# Patient Record
Sex: Female | Born: 1985 | Race: Black or African American | Hispanic: No | Marital: Single | Smoking: Current every day smoker
Health system: Southern US, Community
[De-identification: ages and names within clinical notes are randomized; demographics above are authoritative.]

## PROBLEM LIST (undated history)

## (undated) ENCOUNTER — Inpatient Hospital Stay (HOSPITAL_COMMUNITY): Payer: Self-pay

## (undated) VITALS — BP 104/64 | HR 54 | Temp 98.2°F | Resp 16

## (undated) DIAGNOSIS — L02419 Cutaneous abscess of limb, unspecified: Secondary | ICD-10-CM

## (undated) DIAGNOSIS — F329 Major depressive disorder, single episode, unspecified: Secondary | ICD-10-CM

## (undated) DIAGNOSIS — F32A Depression, unspecified: Secondary | ICD-10-CM

## (undated) DIAGNOSIS — G43909 Migraine, unspecified, not intractable, without status migrainosus: Secondary | ICD-10-CM

## (undated) DIAGNOSIS — F419 Anxiety disorder, unspecified: Secondary | ICD-10-CM

## (undated) DIAGNOSIS — K649 Unspecified hemorrhoids: Secondary | ICD-10-CM

## (undated) DIAGNOSIS — R011 Cardiac murmur, unspecified: Secondary | ICD-10-CM

## (undated) DIAGNOSIS — R0600 Dyspnea, unspecified: Secondary | ICD-10-CM

## (undated) DIAGNOSIS — N390 Urinary tract infection, site not specified: Secondary | ICD-10-CM

## (undated) DIAGNOSIS — K610 Anal abscess: Secondary | ICD-10-CM

## (undated) DIAGNOSIS — T1491XA Suicide attempt, initial encounter: Secondary | ICD-10-CM

## (undated) DIAGNOSIS — F322 Major depressive disorder, single episode, severe without psychotic features: Secondary | ICD-10-CM

## (undated) DIAGNOSIS — R51 Headache: Secondary | ICD-10-CM

## (undated) DIAGNOSIS — F319 Bipolar disorder, unspecified: Secondary | ICD-10-CM

## (undated) DIAGNOSIS — J42 Unspecified chronic bronchitis: Secondary | ICD-10-CM

## (undated) DIAGNOSIS — F1910 Other psychoactive substance abuse, uncomplicated: Secondary | ICD-10-CM

## (undated) DIAGNOSIS — E876 Hypokalemia: Secondary | ICD-10-CM

## (undated) DIAGNOSIS — D649 Anemia, unspecified: Secondary | ICD-10-CM

## (undated) HISTORY — DX: Other psychoactive substance abuse, uncomplicated: F19.10

## (undated) HISTORY — PX: DENTAL SURGERY: SHX609

## (undated) HISTORY — DX: Migraine, unspecified, not intractable, without status migrainosus: G43.909

## (undated) HISTORY — DX: Cardiac murmur, unspecified: R01.1

---
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
## 2003-01-01 ENCOUNTER — Inpatient Hospital Stay (HOSPITAL_COMMUNITY): Admission: AD | Admit: 2003-01-01 | Discharge: 2003-01-01 | Payer: Self-pay | Admitting: Obstetrics

## 2003-05-27 ENCOUNTER — Inpatient Hospital Stay (HOSPITAL_COMMUNITY): Admission: AD | Admit: 2003-05-27 | Discharge: 2003-05-29 | Payer: Self-pay | Admitting: Obstetrics

## 2003-08-24 ENCOUNTER — Emergency Department (HOSPITAL_COMMUNITY): Admission: EM | Admit: 2003-08-24 | Discharge: 2003-08-24 | Payer: Self-pay | Admitting: Emergency Medicine

## 2003-09-04 ENCOUNTER — Emergency Department (HOSPITAL_COMMUNITY): Admission: EM | Admit: 2003-09-04 | Discharge: 2003-09-04 | Payer: Self-pay | Admitting: Emergency Medicine

## 2004-09-17 ENCOUNTER — Emergency Department (HOSPITAL_COMMUNITY): Admission: EM | Admit: 2004-09-17 | Discharge: 2004-09-17 | Payer: Self-pay | Admitting: Emergency Medicine

## 2004-11-15 ENCOUNTER — Emergency Department (HOSPITAL_COMMUNITY): Admission: EM | Admit: 2004-11-15 | Discharge: 2004-11-15 | Payer: Self-pay | Admitting: Emergency Medicine

## 2004-12-02 ENCOUNTER — Emergency Department (HOSPITAL_COMMUNITY): Admission: EM | Admit: 2004-12-02 | Discharge: 2004-12-02 | Payer: Self-pay | Admitting: Emergency Medicine

## 2005-01-13 ENCOUNTER — Emergency Department (HOSPITAL_COMMUNITY): Admission: EM | Admit: 2005-01-13 | Discharge: 2005-01-14 | Payer: Self-pay | Admitting: Emergency Medicine

## 2005-01-15 ENCOUNTER — Inpatient Hospital Stay (HOSPITAL_COMMUNITY): Admission: AD | Admit: 2005-01-15 | Discharge: 2005-01-15 | Payer: Self-pay | Admitting: Obstetrics & Gynecology

## 2005-01-17 ENCOUNTER — Inpatient Hospital Stay (HOSPITAL_COMMUNITY): Admission: AD | Admit: 2005-01-17 | Discharge: 2005-01-17 | Payer: Self-pay | Admitting: Obstetrics

## 2005-01-17 ENCOUNTER — Encounter (INDEPENDENT_AMBULATORY_CARE_PROVIDER_SITE_OTHER): Payer: Self-pay | Admitting: *Deleted

## 2005-12-12 ENCOUNTER — Encounter (INDEPENDENT_AMBULATORY_CARE_PROVIDER_SITE_OTHER): Payer: Self-pay | Admitting: *Deleted

## 2005-12-12 ENCOUNTER — Emergency Department (HOSPITAL_COMMUNITY): Admission: EM | Admit: 2005-12-12 | Discharge: 2005-12-12 | Payer: Self-pay | Admitting: Emergency Medicine

## 2006-04-08 ENCOUNTER — Inpatient Hospital Stay (HOSPITAL_COMMUNITY): Admission: AD | Admit: 2006-04-08 | Discharge: 2006-04-08 | Payer: Self-pay | Admitting: Obstetrics

## 2006-04-24 ENCOUNTER — Inpatient Hospital Stay (HOSPITAL_COMMUNITY): Admission: AD | Admit: 2006-04-24 | Discharge: 2006-04-24 | Payer: Self-pay | Admitting: Obstetrics

## 2006-07-19 ENCOUNTER — Ambulatory Visit: Payer: Self-pay | Admitting: Cardiology

## 2006-07-20 ENCOUNTER — Ambulatory Visit: Payer: Self-pay

## 2006-07-26 ENCOUNTER — Ambulatory Visit: Payer: Self-pay

## 2007-02-17 ENCOUNTER — Emergency Department (HOSPITAL_COMMUNITY): Admission: EM | Admit: 2007-02-17 | Discharge: 2007-02-17 | Payer: Self-pay | Admitting: Emergency Medicine

## 2007-02-19 ENCOUNTER — Emergency Department (HOSPITAL_COMMUNITY): Admission: EM | Admit: 2007-02-19 | Discharge: 2007-02-19 | Payer: Self-pay | Admitting: Emergency Medicine

## 2007-04-08 ENCOUNTER — Emergency Department (HOSPITAL_COMMUNITY): Admission: EM | Admit: 2007-04-08 | Discharge: 2007-04-08 | Payer: Self-pay | Admitting: Emergency Medicine

## 2007-04-11 ENCOUNTER — Emergency Department (HOSPITAL_COMMUNITY): Admission: EM | Admit: 2007-04-11 | Discharge: 2007-04-11 | Payer: Self-pay | Admitting: Family Medicine

## 2007-09-08 ENCOUNTER — Emergency Department (HOSPITAL_COMMUNITY): Admission: EM | Admit: 2007-09-08 | Discharge: 2007-09-09 | Payer: Self-pay | Admitting: Emergency Medicine

## 2007-10-07 ENCOUNTER — Emergency Department (HOSPITAL_COMMUNITY): Admission: EM | Admit: 2007-10-07 | Discharge: 2007-10-07 | Payer: Self-pay | Admitting: Emergency Medicine

## 2008-01-26 ENCOUNTER — Inpatient Hospital Stay (HOSPITAL_COMMUNITY): Admission: AD | Admit: 2008-01-26 | Discharge: 2008-01-29 | Payer: Self-pay | Admitting: *Deleted

## 2008-01-26 ENCOUNTER — Ambulatory Visit: Payer: Self-pay | Admitting: *Deleted

## 2008-02-01 ENCOUNTER — Encounter (INDEPENDENT_AMBULATORY_CARE_PROVIDER_SITE_OTHER): Payer: Self-pay | Admitting: *Deleted

## 2008-02-01 ENCOUNTER — Emergency Department (HOSPITAL_COMMUNITY): Admission: EM | Admit: 2008-02-01 | Discharge: 2008-02-01 | Payer: Self-pay | Admitting: Emergency Medicine

## 2008-04-01 ENCOUNTER — Emergency Department (HOSPITAL_COMMUNITY): Admission: EM | Admit: 2008-04-01 | Discharge: 2008-04-01 | Payer: Self-pay | Admitting: Emergency Medicine

## 2009-04-28 ENCOUNTER — Emergency Department (HOSPITAL_COMMUNITY): Admission: EM | Admit: 2009-04-28 | Discharge: 2009-04-28 | Payer: Self-pay | Admitting: Emergency Medicine

## 2009-12-29 ENCOUNTER — Emergency Department (HOSPITAL_COMMUNITY): Admission: EM | Admit: 2009-12-29 | Discharge: 2009-12-29 | Payer: Self-pay | Admitting: Family Medicine

## 2010-04-09 ENCOUNTER — Emergency Department (HOSPITAL_COMMUNITY): Admission: EM | Admit: 2010-04-09 | Discharge: 2010-04-09 | Payer: Self-pay | Admitting: Emergency Medicine

## 2011-03-05 LAB — POCT URINALYSIS DIP (DEVICE)
Bilirubin Urine: NEGATIVE
Hgb urine dipstick: NEGATIVE
Protein, ur: NEGATIVE mg/dL
Specific Gravity, Urine: 1.025 (ref 1.005–1.030)
pH: 6.5 (ref 5.0–8.0)

## 2011-03-07 LAB — URINALYSIS, ROUTINE W REFLEX MICROSCOPIC
Bilirubin Urine: NEGATIVE
Glucose, UA: NEGATIVE mg/dL
Hgb urine dipstick: NEGATIVE
Specific Gravity, Urine: 1.03 (ref 1.005–1.030)
pH: 6.5 (ref 5.0–8.0)

## 2011-03-07 LAB — URINE MICROSCOPIC-ADD ON

## 2011-03-28 LAB — URINALYSIS, ROUTINE W REFLEX MICROSCOPIC
Glucose, UA: NEGATIVE mg/dL
Ketones, ur: NEGATIVE mg/dL
Protein, ur: >300 mg/dL — AB
Urobilinogen, UA: 1 mg/dL (ref 0.0–1.0)

## 2011-03-28 LAB — WET PREP, GENITAL: Trich, Wet Prep: NONE SEEN

## 2011-03-28 LAB — GC/CHLAMYDIA PROBE AMP, GENITAL: GC Probe Amp, Genital: NEGATIVE

## 2011-03-28 LAB — URINE MICROSCOPIC-ADD ON

## 2011-03-28 LAB — POCT PREGNANCY, URINE: Preg Test, Ur: NEGATIVE

## 2011-05-02 NOTE — H&P (Signed)
NAMEBECKA, LAGASSE NO.:  [id number]   MEDICAL RECORD NO.:  [id number]          PATIENT TYPE:  IPS   LOCATION:  0500                          FACILITY:  [facility]   PHYSICIAN:  Jasmine Pang, M.D. DATE OF BIRTH:  03/07/1986   DATE OF ADMISSION:  01/26/2008  DATE OF DISCHARGE:                       PSYCHIATRIC ADMISSION ASSESSMENT   HISTORY OF PRESENT ILLNESS:  The patient reports having a history of  endorsing mood swings, feeling very angry, wanting to lash out.  She has  not been sleeping.  She has been eating sporadically.  Per chart, it  states the patient was having suicidal thoughts with a plan to jump off  a building or to cut her wrists.  She does report that her mother was  concerned about her and had brought her to mental health and  subsequently was admitted to our facility.   PAST PSYCHIATRIC HISTORY:  First admission to [facility].  She has no history of ever being on any medications.  She was treated as  a teenager for ADD.   SOCIAL HISTORY:  She is a 25 year old old female.  She has two children,  ages three and one.  She has a court date pending in regards to wanting  to keep her house.   FAMILY HISTORY:  Sister reported to be bipolar.   ALCOHOL AND DRUG HISTORY:  The patient reports marijuana use.   PRIMARY CARE Maryssa Giampietro:  The patient reports seeing a Dr. Nolen Mu but  has not seen this Eron Staat recently.   MEDICAL PROBLEMS:  She reports low iron, has had no follow-up with her  medications for some time.  Also reports a history of a leaky valve and  a possible heart murmur.   MEDICATIONS:  None prior to admissions.   DRUG ALLERGIES:  PENICILLIN, AMOXICILLIN and ALBUTEROL.   REVIEW OF SYSTEMS:  She denies any current fever, chills, positive for  insomnia, positive for appetite changes.  No chest pain, positive for  shortness breath.  No nausea, vomiting, no dysuria, no muscle pain.  No  falls.  No seizures.   PHYSICAL  EXAMINATION:  GENERAL APPEARANCE:  This is a young female who  is in no acute distress.  Denying any symptoms of dizziness, shortness  of breath.  VITAL SIGNS:  Temperature is 98.5, 73 heart rate, 16 respirations, blood  pressure is 101/63.  HEENT:  Head is atraumatic.  NECK:  Supple.  CHEST:  Clear, no wheezes or rales.  HEART:  Regular rate and rhythm.  No murmurs or gallops were  auscultated.  BREASTS:  Exam was deferred.  ABDOMEN:  Soft, flat, nontender abdomen.  PELVIC AND GU:  Exam was deferred.  EXTREMITIES:  Moves all extremities.  No clubbing or edema.  Strong at  5+ against resistance.  SKIN:  Warm and dry.  Tattoo is noted at the back of her neck.  NEUROLOGIC:  Findings are intact, nonfocal.  No tics, no tremors.   LABORATORY DATA:  Glucose of 102.  Urine pregnancy test is negative.  Urine drug screen positive for THC.  Hemoglobin of 7, hematocrit  23.5,  MCV is 54.1, MCHC is 29.8 with an RDW 23.3.   MENTAL STATUS EXAM:  She is an alert, somewhat uncooperative female,  sitting with her hood on with poor eye contact.  Speech is soft-spoken  and she offers little.  Her mood is helpless and hopeless, angry,  hostile.  Her thought processes are coherent.  No evidence of any  delusional thinking, cognitive function intact.  Her memory is good.  Judgment and insight fair.  Poor impulse control.   DIAGNOSES:  AXIS I:  1. Mood disorder NOS.  2. Marijuana abuse.  AXIS II:  Deferred.  AXIS III:  1. Anemia.  2. Murmur or leaky valve per patient history.  AXIS IV:  Problems with legal system, psychosocial problems.  Possible  problems with finances and housing.  AXIS V:  Current is 35-40,   PLAN:  Contract for safety.  We will stabilize her mood and thinking,  will have Seroquel available for mood stabilization, for anxiety.  Will  consider Depakote after obtaining a urine pregnancy test.  The patient  to increase her coping skills.  We will initiate iron for her anemia.   The patient was informed to follow up as soon as possible with her  primary care Latyra Jaye for further management of her anemia.  Consider  family session with her mother for concerns and background information.  Case manager will obtain her follow-up.  We will also address her  substance use.  Her tentative length of stay is three to four days.      Landry Corporal, N.P.      Jasmine Pang, M.D.  Electronically Signed    JO/MEDQ  D:  01/28/2008  T:  01/29/2008  Job:  [id number]

## 2011-05-05 ENCOUNTER — Emergency Department (HOSPITAL_COMMUNITY)
Admission: EM | Admit: 2011-05-05 | Discharge: 2011-05-05 | Disposition: A | Discharge status: Home / Self Care | Payer: Medicaid Other | Attending: Emergency Medicine | Admitting: Emergency Medicine

## 2011-05-05 DIAGNOSIS — M25519 Pain in unspecified shoulder: Secondary | ICD-10-CM | POA: Insufficient documentation

## 2011-05-05 DIAGNOSIS — M79609 Pain in unspecified limb: Secondary | ICD-10-CM | POA: Insufficient documentation

## 2011-05-05 NOTE — Assessment & Plan Note (Signed)
[facility]                              CARDIOLOGY OFFICE NOTE   NAME:[name], [name] WINTHER                       MRN:          [id number]  DATE:07/19/2006                            DOB:          14-Nov-1986    CHIEF COMPLAINT:  Blacking out spells and heart fluttering.   HISTORY OF PRESENT ILLNESS:  Ms. [name] [name] is a 25 year old single black  female who is currently about 7 months pregnant.  She gives a history of  having a hole in her heart when she was younger.  In middle school, she was  told she had a heart murmur but does not remember having an echocardiogram.   Over the past several weeks, she has been having spells either at rest or  with walking where she feels dizzy and starts to black out.  She loses her  vision but does not lose her hearing.  She usually sits down, and it  resolves.  With this usually is some heart palpitations.  She is not sure of  having any recent blood work including thyroid.  She has had no true  syncope.  She has no chest discomfort or chest tightness with exertion.  She  has had no history of a seizure disorder.  There has been no seizure  activity noted.   MEDICATIONS:  She is currently on prenatal vitamins and ferrous sulfate.   ALLERGIES:  She has no known drug allergies.   SOCIAL HISTORY:  She does not smoke, does not drink or use recreational  drugs or stimulants.  She uses very little if any caffeine.  She does not  work.  She is single and has 1 child.   FAMILY HISTORY:  Negative for cardiac death or sudden death.   REVIEW OF SYSTEMS:  Significant for chronic anemia, constipation, fatigue,  anxiety and depression.   PHYSICAL EXAMINATION:  GENERAL:  She is in no acute distress.  VITAL SIGNS:  Her blood pressure is 90/58, pulse 62 and regular.  EKG is  completely normal.  She weighs 150 pounds.  HEENT:  No exophthalmia.  PERRLA.  Extraocular movements are intact.  Sclerae are slightly muddy.  Facial  symmetry is normal.  Carotid upstrokes  are equal bilaterally without bruit.  There is a soft systolic sound on the  left.  Thyroid is palpable and symmetric with no nodules or masses.  LUNGS:  Clear.  HEART:  Regular rate and rhythm with no significant murmur.  S2 splits  physiologically.  ABDOMEN:  Soft.  Good bowel sounds.  EXTREMITIES:  No edema.  Pulses are intact.   ASSESSMENT AND PLAN:  Tachy palpitations associated with presyncope.  There  is a question of some congenital heart disease, but I do not see anything by  examination that is highly suspicious.   PLAN:  1.  Check CMP, TSH, and magnesium.  2.  Two-dimensional echocardiogram.  3.  Event recorder.  [name] C. Daleen Squibb, MD, [facility]    TCW/MedQ  DD:  07/19/2006  DT:  07/19/2006  Job #:  [id number]   cc:   [name] [name]

## 2011-05-05 NOTE — Discharge Summary (Signed)
[name], [name] NO.:  [id number]   MEDICAL RECORD NO.:  [id number]          PATIENT TYPE:  IPS   LOCATION:  0500                          FACILITY:  [facility]   PHYSICIAN:  Jasmine Pang, M.D. DATE OF BIRTH:  [birth year]   DATE OF ADMISSION:  01/26/2008  DATE OF DISCHARGE:  01/29/2008                               DISCHARGE SUMMARY   IDENTIFICATION:  This is a 25 year old female who was admitted on a  voluntary basis on January 26, 2008.   HISTORY OF PRESENT ILLNESS:  The patient reports having a history of  endorsing mood swings.  She has been feeling very angry and wanting to  lash out.  She has not been sleeping.  She has been eating sporadically.  Per the chart, it states that the patient was having suicidal thoughts  with a plan to jump off a building or to cut her wrist.  She does report  that her mother was concerned about her and brought her to mental health  and subsequently, she was admitted to our facility.   PAST PSYCHIATRIC HISTORY:  This is the first admission to [facility].  She has no history of ever being on any  medications.  She was treated as a teenager for ADD.   FAMILY HISTORY:  The patient's sister is reported to be bipolar.   SUBSTANCE ABUSE HISTORY:  The patient reports marijuana use.   MEDICAL PROBLEMS:  The patient reports low iron.  She has had no  followup with her medications for some time.  She also reports a history  of a leaky valve and possible heart murmur.   MEDICATIONS:  None prior to admission.   DRUG ALLERGIES:  PENICILLIN, AMOXICILLIN, and ALBUTEROL.   PHYSICAL EXAM:  There were no acute physical or medical abnormalities  noted.   LABORATORY DATA:  Glucose was 102.  Urine pregnancy test was negative.  Urine drug screen positive for THC.  Hemoglobin was 7 and hematocrit  23.5.  MCV was 54.1, MCHC was 29.8, and RDW was 23.3.   HOSPITAL COURSE:  Upon admission, the patient was started on  Ambien 10  mg p.o. q.h.s. p.r.n. insomnia.  She was also started on Seroquel 50 mg  p.o. q.4 hours p.r.n. anxiety.  She was also started on ferrous sulfate  325 mg 1 p.o. t.i.d. and Colace 100 mg p.o. b.i.d. for constipation.  In  individual sessions with me, the patient was initially quiet and  reserved.  She was able to participate appropriately in unit therapeutic  groups and activities.  She discussed her mood swings with suicidal  ideation.  She states they have been present for several years, but  worsened recently.  She has been going through court  to keep her house.  Last year, her godson and a friend died.  Last weekend, another friend  died.  The patient felt overwhelmed and unable to cope with things.  As  hospitalization progressed, the patient's sleep and appetite improved.  She continued to have some feelings of agitation and feelings of  positive  self-injurious behavior,  poked self with a fork.  On  January 28, 2008, she was started on Depakote ER 500 mg p.o. q.h.s. and  Seroquel was increased to 100 mg p.o. q.4 hours p.r.n. agitation.  She  was also started on Seroquel 50 mg p.o. t.i.d.  On January 29, 2008,  the patient's mental status had improved.  Her sleep was good.  Appetite  was fair, though she stated she did not like the hospital food.  Mood,  there was some irritability, but it was less depressed and less anxious.  Affect consistent with mood.  No suicidal or homicidal ideation.  No  auditory or visual hallucinations.  No paranoia or delusions.  Thoughts  were logical and goal-directed.  Thought content, no predominant theme.  Cognitive was grossly back to baseline.  It was felt that the patient  was safe for discharge and ready to begin outpatient therapy.   DISCHARGE DIAGNOSES:  Axis I:  Mood disorder, not otherwise specified.  Marijuana abuse.  Axis II:  None.  Axis III:  Anemia, also murmur or leaky valve per patient history.  Axis IV:  Severe problems  with legal system, psychosocial problems,  problems with finances and housing, burden of psychiatric illness.  Axis V:  Global assessment of functioning was 50 upon discharge.  GAF  was 35-40 upon admission.  GAF highest past year was 65.   DISCHARGE PLANS:  There were no specific activity level or dietary  restrictions.   POSTHOSPITAL CARE PLANS:  The patient will be seen by Dr. Lang Snow at the  [facility] on February 06, 2008, at 10:30 a.m.  She will be seeing  [facility] for therapy.  The patient was given the phone number and  told that she must call to schedule this as per [facility] protocol.  She was to follow up with [facility] or urgent care for her medical  problems or Dr. Nolen Mu, her primary care physician.   DISCHARGE MEDICATIONS:  1. Seroquel 100 mg t.i.d. p.r.n. anxiety.  2. Depakote ER 500 mg at bedtime.  3. Ambien 10 mg at bedtime p.r.n. insomnia.      Jasmine Pang, M.D.  Electronically Signed     BHS/MEDQ  D:  02/14/2008  T:  02/14/2008  Job:  [id number]

## 2011-09-04 ENCOUNTER — Emergency Department (HOSPITAL_COMMUNITY)
Admission: EM | Admit: 2011-09-04 | Discharge: 2011-09-04 | Disposition: A | Discharge status: Home / Self Care | Payer: Medicaid Other | Attending: Emergency Medicine | Admitting: Emergency Medicine

## 2011-09-04 DIAGNOSIS — R51 Headache: Secondary | ICD-10-CM | POA: Insufficient documentation

## 2011-09-04 DIAGNOSIS — D649 Anemia, unspecified: Secondary | ICD-10-CM | POA: Insufficient documentation

## 2011-09-04 DIAGNOSIS — R1013 Epigastric pain: Secondary | ICD-10-CM | POA: Insufficient documentation

## 2011-09-04 DIAGNOSIS — F319 Bipolar disorder, unspecified: Secondary | ICD-10-CM | POA: Insufficient documentation

## 2011-09-04 DIAGNOSIS — R110 Nausea: Secondary | ICD-10-CM | POA: Insufficient documentation

## 2011-09-04 DIAGNOSIS — H53149 Visual discomfort, unspecified: Secondary | ICD-10-CM | POA: Insufficient documentation

## 2011-09-04 LAB — OCCULT BLOOD, POC DEVICE: Fecal Occult Bld: NEGATIVE

## 2011-09-04 LAB — POCT I-STAT, CHEM 8
Calcium, Ion: 1.27 mmol/L (ref 1.12–1.32)
Chloride: 106 mEq/L (ref 96–112)
Creatinine, Ser: 0.7 mg/dL (ref 0.50–1.10)
Glucose, Bld: 88 mg/dL (ref 70–99)
Potassium: 3.6 mEq/L (ref 3.5–5.1)

## 2011-09-04 LAB — CBC
HCT: 26.2 % — ABNORMAL LOW (ref 36.0–46.0)
MCH: 15.5 pg — ABNORMAL LOW (ref 26.0–34.0)
MCV: 54.9 fL — ABNORMAL LOW (ref 78.0–100.0)
RBC: 4.77 MIL/uL (ref 3.87–5.11)
WBC: 5.6 10*3/uL (ref 4.0–10.5)

## 2011-09-08 LAB — URINALYSIS, ROUTINE W REFLEX MICROSCOPIC
Bilirubin Urine: NEGATIVE
Ketones, ur: NEGATIVE
Ketones, ur: NEGATIVE
Leukocytes, UA: NEGATIVE
Nitrite: NEGATIVE
Nitrite: NEGATIVE
Protein, ur: NEGATIVE
Protein, ur: NEGATIVE
Urobilinogen, UA: 1
Urobilinogen, UA: 0.2
pH: 7.5
pH: 6.5

## 2011-09-08 LAB — COMPREHENSIVE METABOLIC PANEL
ALT: 17
ALT: 21
AST: 18
Albumin: 3.5
Alkaline Phosphatase: 49
CO2: 24
Calcium: 9
Calcium: 9.8
Chloride: 108
Creatinine, Ser: 0.65
GFR calc Af Amer: >60
GFR calc non Af Amer: >60
Glucose, Bld: 86
Potassium: 3.8
Sodium: 138
Sodium: 139
Total Bilirubin: 1.3 — ABNORMAL HIGH
Total Protein: 6.4

## 2011-09-08 LAB — CBC
Hemoglobin: 14.7
MCHC: 34.4
MCV: 84.8
Platelets: 557 — ABNORMAL HIGH
RBC: 5.05
RDW: 23.3 — ABNORMAL HIGH
WBC: 6.1

## 2011-09-08 LAB — DRUGS OF ABUSE SCREEN W/O ALC, ROUTINE URINE
Barbiturate Quant, Ur: NEGATIVE
Cocaine Metabolites: NEGATIVE
Marijuana Metabolite: POSITIVE — AB
Propoxyphene: NEGATIVE

## 2011-09-08 LAB — DIFFERENTIAL
Basophils Absolute: 0
Eosinophils Absolute: 0
Eosinophils Relative: 1
Lymphs Abs: 1.4
Neutrophils Relative %: 71

## 2011-09-08 LAB — THC (MARIJUANA), URINE, CONFIRMATION: Marijuana, Ur-Confirmation: 240 ng/mL

## 2011-09-08 LAB — LIPASE, BLOOD: Lipase: 21

## 2011-09-08 LAB — PREGNANCY, URINE: Preg Test, Ur: NEGATIVE

## 2011-09-12 LAB — URINE CULTURE: Colony Count: >=100000

## 2011-09-12 LAB — URINALYSIS, ROUTINE W REFLEX MICROSCOPIC
Nitrite: POSITIVE — AB
Protein, ur: >300 — AB
Specific Gravity, Urine: 1.025
Urobilinogen, UA: 0.2

## 2011-09-12 LAB — URINE MICROSCOPIC-ADD ON

## 2011-09-12 LAB — PREGNANCY, URINE: Preg Test, Ur: NEGATIVE

## 2011-09-12 LAB — WET PREP, GENITAL

## 2011-09-12 LAB — GC/CHLAMYDIA PROBE AMP, GENITAL: Chlamydia, DNA Probe: NEGATIVE

## 2011-09-27 LAB — DIFFERENTIAL
Basophils Absolute: 0
Lymphs Abs: 2.5
Monocytes Absolute: 0.3
Monocytes Relative: 4
Neutrophils Relative %: 66

## 2011-09-27 LAB — CBC
MCHC: 29.4 — ABNORMAL LOW
MCV: 54.9 — ABNORMAL LOW
Platelets: 615 — ABNORMAL HIGH
WBC: 8.5

## 2011-09-27 LAB — I-STAT 8, (EC8 V) (CONVERTED LAB)
Acid-base deficit: 8 — ABNORMAL HIGH
Operator id: 151321
TCO2: 19
pCO2, Ven: 36 — ABNORMAL LOW
pH, Ven: 7.298

## 2011-09-27 LAB — POCT PREGNANCY, URINE: Operator id: 151391

## 2011-11-04 ENCOUNTER — Emergency Department (HOSPITAL_COMMUNITY)
Admission: EM | Admit: 2011-11-04 | Discharge: 2011-11-04 | Disposition: A | Discharge status: Home / Self Care | Payer: Self-pay | Attending: Emergency Medicine | Admitting: Emergency Medicine

## 2011-11-04 ENCOUNTER — Encounter: Payer: Self-pay | Admitting: *Deleted

## 2011-11-04 DIAGNOSIS — F17200 Nicotine dependence, unspecified, uncomplicated: Secondary | ICD-10-CM | POA: Insufficient documentation

## 2011-11-04 DIAGNOSIS — N644 Mastodynia: Secondary | ICD-10-CM | POA: Insufficient documentation

## 2011-11-04 HISTORY — DX: Anemia, unspecified: D64.9

## 2011-11-04 HISTORY — DX: Cutaneous abscess of limb, unspecified: L02.419

## 2011-11-04 MED ORDER — IBUPROFEN 600 MG PO TABS: 600.0000 mg | ORAL_TABLET | Freq: Four times a day (QID) | ORAL | Status: AC | PRN

## 2011-11-04 MED ORDER — IBUPROFEN 200 MG PO TABS
600.0000 mg | ORAL_TABLET | Freq: Once | ORAL | Status: AC
  Administered 2011-11-04: 600 mg via ORAL
  Filled 2011-11-04: qty 3

## 2011-11-04 NOTE — ED Provider Notes (Signed)
History     CSN: [id number] Arrival date & time: 11/04/2011 10:17 AM   First MD Initiated Contact with Patient 11/04/11 1111      Chief Complaint  Patient presents with  . Breast Pain    (Consider location/radiation/quality/duration/timing/severity/associated sxs/prior treatment) The history is provided by the patient.   patient reports pain and discomfort in the left nipple for several weeks.  Worsened by palpation.  Improved by nothing.  She reports no fever or chills.  She denies erythema or warmth of the area.  She is 25 years old.  She's has no other complaints.  She reports she is also going to need a note for work because she reports she has discomfort with lifting.  His had no weight changes.  Her discomfort as constant.  It is moderate in severity  Past Medical History  Diagnosis Date  . Anemia   . Abscess of axilla     History reviewed. No pertinent past surgical history.  History reviewed. No pertinent family history.  History  Substance Use Topics  . Smoking status: Current Everyday Smoker -- 0.5 packs/day  . Smokeless tobacco: Not on file  . Alcohol Use: No    OB History    Grav Para Term Preterm Abortions TAB SAB Ect Mult Living                  Review of Systems  All other systems reviewed and are negative.    Allergies  Amoxicillin and Penicillins  Home Medications   Current Outpatient Rx  Name Route Sig Dispense Refill  . ARIPIPRAZOLE 15 MG PO TABS Oral Take 15 mg by mouth daily.      . BUPROPION HCL ER (XL) 150 MG PO TB24 Oral Take 150 mg by mouth daily.      . IRON PO Oral Take 1 tablet by mouth daily.      Marland Kitchen LAMOTRIGINE 100 MG PO TABS Oral Take 100 mg by mouth daily.      . COMPLETE ENERGY PO Oral Take 1 tablet by mouth daily.        BP 110/63  Pulse 65  Temp(Src) 97.1 F (36.2 C) (Oral)  Resp 18  Ht 5\' 7"  (1.702 m)  Wt 135 lb (61.236 kg)  BMI 21.14 kg/m2  SpO2 100%  Physical Exam  Constitutional: She is oriented to person,  place, and time. She appears well-developed and well-nourished.  HENT:  Head: Normocephalic.  Eyes: EOM are normal.  Neck: Normal range of motion.  Pulmonary/Chest: Effort normal.       Bilateral breasts are symmetric without warmth erythema or fluctuance.  Left breast there is some small tenderness just deep to the area with a very small approximately 0.5-1 cm nodule palpable. Nipple without fluctuance or warmth on left.  There is no discharge able to be expressed from the left nipple.   Musculoskeletal: Normal range of motion.  Neurological: She is alert and oriented to person, place, and time.  Psychiatric: She has a normal mood and affect.    ED Course  Procedures (including critical care time)  Labs Reviewed - No data to display No results found.   1. Breast pain in female       MDM  No signs of infection at this time.  Doubt deep abscess.  No lymphadenopathy noted.  I will refer this patient to the breast Center for further evaluation and likely imaging of some modality.        Vania Rea  Patria Mane, MD 11/04/11 1131

## 2011-11-04 NOTE — ED Notes (Signed)
Pt states that she has had pain x 1 week in her left breast.  Pt states that she has increase in pain with palpation. Pt states that she has had minimal amount of discharge from her nipple with palpation of her breast.  Pt states she feels a lump in her breast.

## 2011-11-04 NOTE — ED Notes (Signed)
Left breast pain with palpation.

## 2012-04-13 ENCOUNTER — Emergency Department (HOSPITAL_COMMUNITY)
Admission: EM | Admit: 2012-04-13 | Discharge: 2012-04-13 | Disposition: A | Discharge status: Home / Self Care | Payer: Self-pay | Attending: Emergency Medicine | Admitting: Emergency Medicine

## 2012-04-13 ENCOUNTER — Encounter (HOSPITAL_COMMUNITY): Payer: Self-pay | Admitting: *Deleted

## 2012-04-13 DIAGNOSIS — F17200 Nicotine dependence, unspecified, uncomplicated: Secondary | ICD-10-CM | POA: Insufficient documentation

## 2012-04-13 DIAGNOSIS — M549 Dorsalgia, unspecified: Secondary | ICD-10-CM

## 2012-04-13 DIAGNOSIS — M546 Pain in thoracic spine: Secondary | ICD-10-CM | POA: Insufficient documentation

## 2012-04-13 MED ORDER — HYDROCODONE-ACETAMINOPHEN 5-325 MG PO TABS: 1.0000 | ORAL_TABLET | ORAL | Status: AC | PRN

## 2012-04-13 MED ORDER — PREDNISONE 20 MG PO TABS: 20.0000 mg | ORAL_TABLET | Freq: Every day | ORAL | Status: AC

## 2012-04-13 MED ORDER — DIAZEPAM 5 MG PO TABS
5.0000 mg | ORAL_TABLET | Freq: Once | ORAL | Status: AC
  Administered 2012-04-13: 5 mg via ORAL
  Filled 2012-04-13: qty 1

## 2012-04-13 MED ORDER — CYCLOBENZAPRINE HCL 10 MG PO TABS: 10.0000 mg | ORAL_TABLET | Freq: Three times a day (TID) | ORAL | Status: AC | PRN

## 2012-04-13 MED ORDER — HYDROCODONE-ACETAMINOPHEN 5-325 MG PO TABS
1.0000 | ORAL_TABLET | Freq: Once | ORAL | Status: AC
  Administered 2012-04-13: 1 via ORAL
  Filled 2012-04-13: qty 1

## 2012-04-13 MED ORDER — PREDNISONE 20 MG PO TABS
60.0000 mg | ORAL_TABLET | Freq: Once | ORAL | Status: AC
  Administered 2012-04-13: 60 mg via ORAL
  Filled 2012-04-13: qty 3

## 2012-04-13 NOTE — ED Provider Notes (Signed)
History     CSN: [id number]  Arrival date & time 04/13/12  2027   First MD Initiated Contact with Patient 04/13/12 2219      Chief Complaint  Patient presents with  . Back Pain    HPI: Patient is a 26 y.o. female presenting with back pain. The history is provided by the patient.  Back Pain  The current episode started more than 2 days ago. The problem occurs constantly. The problem has been gradually worsening. The pain is associated with no known injury. The pain is present in the thoracic spine. The quality of the pain is described as stabbing. The pain does not radiate. The pain is at a severity of 10/10. The pain is severe. The symptoms are aggravated by bending and certain positions. The pain is the same all the time. Pertinent negatives include no numbness, no abdominal pain, no bowel incontinence, no perianal numbness, no bladder incontinence, no paresthesias, no tingling and no weakness. She has tried NSAIDs for the symptoms. The treatment provided no relief.  Pt reports 3 to 4 day hx of persistent mid upper back pain. States she has had on and off since a childhood accident. No recent injury.  States she used to go to a [place] but is unable to afford now. Was sent from her job for eval because pain interfered w/ her ability to work. Pt denies numbness, tingling, weakness or other neurological symptoms. Past Medical History  Diagnosis Date  . Anemia   . Abscess of axilla     History reviewed. No pertinent past surgical history.  No family history on file.  History  Substance Use Topics  . Smoking status: Current Everyday Smoker -- 0.5 packs/day  . Smokeless tobacco: Not on file  . Alcohol Use: No    OB History    Grav Para Term Preterm Abortions TAB SAB Ect Mult Living                  Review of Systems  Gastrointestinal: Negative for abdominal pain and bowel incontinence.  Genitourinary: Negative for bladder incontinence.  Musculoskeletal: Positive for back  pain.  Neurological: Negative for tingling, weakness, numbness and paresthesias.  All other systems reviewed and are negative.    Allergies  Amoxicillin and Penicillins  Home Medications  No current outpatient prescriptions on file.  BP 97/47  Pulse 68  Temp(Src) 98.2 F (36.8 C) (Oral)  Resp 18  SpO2 100%  Physical Exam  Constitutional: She is oriented to person, place, and time. She appears well-developed and well-nourished.  HENT:  Head: Normocephalic and atraumatic.  Eyes: Conjunctivae are normal.  Cardiovascular: Normal rate.   Pulmonary/Chest: Effort normal.  Musculoskeletal: Normal range of motion.       Back:  Neurological: She is alert and oriented to person, place, and time.       UE grips strong and equal   Skin: Skin is warm and dry.  Psychiatric: She has a normal mood and affect.    ED Course  Procedures (including critical care time)  Labs Reviewed - No data to display No results found.   No diagnosis found.    MDM  HPI/PE and clinical findings c/w 1. Acute on chronic back pain (No recent injury, no focal neurological findings)        Leanne Chang, NP 04/13/12 2313

## 2012-04-13 NOTE — ED Notes (Signed)
Patient given discharge paperwork; went over discharge instructions with patient.  Patient instructed to take medications as prescribed and to not drive while taking pain medications.  Patient instructed to follow up with Dr. Lajoyce Corners if symptoms persist and to return to the ED for new, worsening, or concerning symptoms.

## 2012-04-13 NOTE — Discharge Instructions (Signed)
  Please read over the instructions below.  Take medications as directed. If your back pain persists call to make arrangements with Dr Lajoyce Corners for further evaluation.   Back Pain, Adult Back pain is very common. The pain often gets better over time. The cause of back pain is usually not dangerous. Most people can learn to manage their back pain on their own.  HOME CARE   Stay active. Start with short walks on flat ground if you can. Try to walk farther each day.   Do not sit, drive, or stand in one place for more than 30 minutes. Do not stay in bed.   Do not avoid exercise or work. Activity can help your back heal faster.   Be careful when you bend or lift an object. Bend at your knees, keep the object close to you, and do not twist.   Sleep on a firm mattress. Lie on your side, and bend your knees. If you lie on your back, put a pillow under your knees.   Only take medicines as told by your doctor.   Put ice on the injured area.   Put ice in a plastic bag.   Place a towel between your skin and the bag.   Leave the ice on for 15 to 20 minutes, 3 to 4 times a day for the first 2 to 3 days. After that, you can switch between ice and heat packs.   Ask your doctor about back exercises or massage.   Avoid feeling anxious or stressed. Find good ways to deal with stress, such as exercise.  GET HELP RIGHT AWAY IF:   Your pain does not go away with rest or medicine.   Your pain does not go away in 1 week.   You have new problems.   You do not feel well.   The pain spreads into your legs.   You cannot control when you poop (bowel movement) or pee (urinate).   Your arms or legs feel weak or lose feeling (numbness).   You feel sick to your stomach (nauseous) or throw up (vomit).   You have belly (abdominal) pain.   You feel like you may pass out (faint).  MAKE SURE YOU:   Understand these instructions.   Will watch your condition.   Will get help right away if you are not  doing well or get worse.  Document Released: 05/22/2008 Document Revised: 11/23/2011 Document Reviewed: 04/24/2011 [facility] Patient Information 2012 [place], [place].

## 2012-04-13 NOTE — ED Notes (Signed)
Received bedside report from [place], Charity fundraiser.  Patient currently sitting up in bed; no respiratory or acute distress noted.  Patient updated on plan of care; informed patient that we are currently waiting on further orders from FNP.  Patient has no other questions or concerns at this time; will continue to monitor.

## 2012-04-13 NOTE — ED Provider Notes (Signed)
Medical screening examination/treatment/procedure(s) were performed by non-physician practitioner and as supervising physician I was immediately available for consultation/collaboration. Devoria Albe, MD, [facility]   Ward Givens, MD 04/13/12 [phone]

## 2012-04-13 NOTE — ED Notes (Signed)
Back pain for one year worse for the past 2-3 days.  She has difficulty sleeping also

## 2012-06-17 ENCOUNTER — Inpatient Hospital Stay (HOSPITAL_COMMUNITY)
Admission: EM | Admit: 2012-06-17 | Discharge: 2012-06-19 | DRG: 918 | Disposition: A | Discharge status: Other Acute Inpatient Hospital | Payer: Self-pay | Attending: Internal Medicine | Admitting: Internal Medicine

## 2012-06-17 ENCOUNTER — Encounter (HOSPITAL_COMMUNITY): Payer: Self-pay

## 2012-06-17 DIAGNOSIS — T43502A Poisoning by unspecified antipsychotics and neuroleptics, intentional self-harm, initial encounter: Secondary | ICD-10-CM | POA: Diagnosis present

## 2012-06-17 DIAGNOSIS — T39314A Poisoning by propionic acid derivatives, undetermined, initial encounter: Secondary | ICD-10-CM | POA: Diagnosis present

## 2012-06-17 DIAGNOSIS — T394X2A Poisoning by antirheumatics, not elsewhere classified, intentional self-harm, initial encounter: Secondary | ICD-10-CM | POA: Diagnosis present

## 2012-06-17 DIAGNOSIS — Z880 Allergy status to penicillin: Secondary | ICD-10-CM

## 2012-06-17 DIAGNOSIS — F322 Major depressive disorder, single episode, severe without psychotic features: Secondary | ICD-10-CM

## 2012-06-17 DIAGNOSIS — T43501A Poisoning by unspecified antipsychotics and neuroleptics, accidental (unintentional), initial encounter: Secondary | ICD-10-CM | POA: Diagnosis present

## 2012-06-17 DIAGNOSIS — F17200 Nicotine dependence, unspecified, uncomplicated: Secondary | ICD-10-CM | POA: Diagnosis present

## 2012-06-17 DIAGNOSIS — D509 Iron deficiency anemia, unspecified: Secondary | ICD-10-CM | POA: Diagnosis present

## 2012-06-17 DIAGNOSIS — T1491XA Suicide attempt, initial encounter: Secondary | ICD-10-CM | POA: Diagnosis present

## 2012-06-17 DIAGNOSIS — T50901A Poisoning by unspecified drugs, medicaments and biological substances, accidental (unintentional), initial encounter: Secondary | ICD-10-CM | POA: Diagnosis present

## 2012-06-17 DIAGNOSIS — N390 Urinary tract infection, site not specified: Secondary | ICD-10-CM | POA: Diagnosis present

## 2012-06-17 DIAGNOSIS — E876 Hypokalemia: Secondary | ICD-10-CM | POA: Diagnosis present

## 2012-06-17 DIAGNOSIS — T438X2A Poisoning by other psychotropic drugs, intentional self-harm, initial encounter: Secondary | ICD-10-CM | POA: Diagnosis present

## 2012-06-17 DIAGNOSIS — T43294A Poisoning by other antidepressants, undetermined, initial encounter: Principal | ICD-10-CM | POA: Diagnosis present

## 2012-06-17 DIAGNOSIS — X838XXA Intentional self-harm by other specified means, initial encounter: Secondary | ICD-10-CM

## 2012-06-17 DIAGNOSIS — Z720 Tobacco use: Secondary | ICD-10-CM | POA: Diagnosis present

## 2012-06-17 DIAGNOSIS — Z79899 Other long term (current) drug therapy: Secondary | ICD-10-CM

## 2012-06-17 HISTORY — DX: Depression, unspecified: F32.A

## 2012-06-17 HISTORY — DX: Major depressive disorder, single episode, severe without psychotic features: F32.2

## 2012-06-17 HISTORY — DX: Hypokalemia: E87.6

## 2012-06-17 HISTORY — DX: Major depressive disorder, single episode, unspecified: F32.9

## 2012-06-17 LAB — COMPREHENSIVE METABOLIC PANEL
ALT: 8 U/L (ref 0–35)
Albumin: 3.6 g/dL (ref 3.5–5.2)
Alkaline Phosphatase: 42 U/L (ref 39–117)
Glucose, Bld: 121 mg/dL — ABNORMAL HIGH (ref 70–99)
Potassium: 3.1 mEq/L — ABNORMAL LOW (ref 3.5–5.1)
Sodium: 141 mEq/L (ref 135–145)
Total Protein: 6.7 g/dL (ref 6.0–8.3)

## 2012-06-17 LAB — CBC WITH DIFFERENTIAL/PLATELET
Basophils Absolute: 0 10*3/uL (ref 0.0–0.1)
Eosinophils Relative: 1 % (ref 0–5)
HCT: 23.9 % — ABNORMAL LOW (ref 36.0–46.0)
Lymphocytes Relative: 40 % (ref 12–46)
Monocytes Relative: 7 % (ref 3–12)
Neutro Abs: 3.1 10*3/uL (ref 1.7–7.7)
RBC: 4.27 MIL/uL (ref 3.87–5.11)
RDW: 21.4 % — ABNORMAL HIGH (ref 11.5–15.5)
WBC: 6 10*3/uL (ref 4.0–10.5)

## 2012-06-17 LAB — URINALYSIS, ROUTINE W REFLEX MICROSCOPIC
Ketones, ur: NEGATIVE mg/dL
Nitrite: POSITIVE — AB
Protein, ur: NEGATIVE mg/dL
Urobilinogen, UA: 1 mg/dL (ref 0.0–1.0)

## 2012-06-17 LAB — BLOOD GAS, ARTERIAL
Bicarbonate: 21.8 mEq/L (ref 20.0–24.0)
FIO2: 0.21 %
pH, Arterial: 7.356 (ref 7.350–7.400)
pO2, Arterial: 96.3 mmHg (ref 80.0–100.0)

## 2012-06-17 LAB — RAPID URINE DRUG SCREEN, HOSP PERFORMED
Barbiturates: NOT DETECTED
Benzodiazepines: POSITIVE — AB

## 2012-06-17 LAB — URINE MICROSCOPIC-ADD ON

## 2012-06-17 LAB — OCCULT BLOOD, POC DEVICE: Fecal Occult Bld: NEGATIVE

## 2012-06-17 LAB — ABO/RH: ABO/RH(D): O POS

## 2012-06-17 MED ORDER — OXYCODONE HCL 5 MG PO TABS: 5.0000 mg | ORAL_TABLET | ORAL | Status: DC | PRN

## 2012-06-17 MED ORDER — SODIUM CHLORIDE 0.9 % IV SOLN
1000.0000 mL | INTRAVENOUS | Status: DC
  Administered 2012-06-17: 1000 mL via INTRAVENOUS

## 2012-06-17 MED ORDER — POTASSIUM CHLORIDE 10 MEQ/100ML IV SOLN
10.0000 meq | INTRAVENOUS | Status: AC
  Administered 2012-06-17 – 2012-06-18 (×3): 10 meq via INTRAVENOUS
  Filled 2012-06-17 (×3): qty 100

## 2012-06-17 MED ORDER — PANTOPRAZOLE SODIUM 40 MG IV SOLR
40.0000 mg | Freq: Two times a day (BID) | INTRAVENOUS | Status: DC
  Administered 2012-06-17 – 2012-06-19 (×4): 40 mg via INTRAVENOUS
  Filled 2012-06-17 (×6): qty 40

## 2012-06-17 MED ORDER — ONDANSETRON HCL 4 MG/2ML IJ SOLN: 4.0000 mg | Freq: Four times a day (QID) | INTRAMUSCULAR | Status: DC | PRN

## 2012-06-17 MED ORDER — SODIUM CHLORIDE 0.9 % IV SOLN
INTRAVENOUS | Status: DC
  Administered 2012-06-17 – 2012-06-19 (×3): via INTRAVENOUS

## 2012-06-17 MED ORDER — CIPROFLOXACIN IN D5W 400 MG/200ML IV SOLN
400.0000 mg | Freq: Two times a day (BID) | INTRAVENOUS | Status: DC
  Administered 2012-06-17 – 2012-06-19 (×4): 400 mg via INTRAVENOUS
  Filled 2012-06-17 (×6): qty 200

## 2012-06-17 MED ORDER — POTASSIUM CHLORIDE 10 MEQ/100ML IV SOLN
10.0000 meq | Freq: Once | INTRAVENOUS | Status: AC
  Administered 2012-06-17: 10 meq via INTRAVENOUS
  Filled 2012-06-17: qty 100

## 2012-06-17 MED ORDER — ONDANSETRON HCL 4 MG PO TABS: 4.0000 mg | ORAL_TABLET | Freq: Four times a day (QID) | ORAL | Status: DC | PRN

## 2012-06-17 MED ORDER — SODIUM CHLORIDE 0.9 % IV SOLN
1000.0000 mL | Freq: Once | INTRAVENOUS | Status: AC
  Administered 2012-06-17: 1000 mL via INTRAVENOUS

## 2012-06-17 NOTE — ED Provider Notes (Signed)
History     CSN: [id number] Arrival date & time 06/17/12  1652 First MD Initiated Contact with Patient 06/17/12 1739     Chief Complaint  Patient presents with  . Drug Overdose    HPI The patient presents to the emergency room with complaints of a medication overdose.  History is limited by the patient's mental state. She is here with a significant other. Her significant other are noted the patient had been having trouble with stress and depression over the last several weeks to months. She called her mother asking to go he evaluated.  The patient told her significant other that she was going to drink a lot of alcohol.  After her significant other and went to walk the dog she found her back to the house having taken a large amount of pills. This was about 4:30 PM to per EMS she took 15 tablets of Abilify, trazodone and Naprosyn. The patient was alert upon EMS arrival but she had vomited once and she was somewhat somnolent. Past Medical History  Diagnosis Date  . Anemia   . Abscess of axilla     History reviewed. No pertinent past surgical history.  History reviewed. No pertinent family history.  History  Substance Use Topics  . Smoking status: Current Everyday Smoker -- 0.5 packs/day  . Smokeless tobacco: Not on file  . Alcohol Use: No    OB History    Grav Para Term Preterm Abortions TAB SAB Ect Mult Living                  Review of Systems  All other systems reviewed and are negative.    Allergies  Amoxicillin and Penicillins  Home Medications   Current Outpatient Rx  Name Route Sig Dispense Refill  . ARIPIPRAZOLE 10 MG PO TABS Oral Take 10 mg by mouth daily.    Marland Kitchen BENZOCAINE 10 % MT GEL Mouth/Throat Use as directed 1 application in the mouth or throat as needed. Tooth pain    . IBUPROFEN 800 MG PO TABS Oral Take 800 mg by mouth every 8 (eight) hours as needed.    Marland Kitchen LAMOTRIGINE 100 MG PO TABS Oral Take 100 mg by mouth daily.    Marland Kitchen NAPROXEN 500 MG PO TABS Oral Take  500 mg by mouth 2 (two) times daily as needed. Pain    . TRAZODONE HCL 50 MG PO TABS Oral Take 50 mg by mouth at bedtime.      BP 117/81  Pulse 62  Temp 96.3 F (35.7 C) (Other (Comment))  Resp 21  SpO2 100%  LMP 06/17/2012  Physical Exam  Nursing note and vitals reviewed. Constitutional: She appears well-developed and well-nourished. She appears lethargic. She is sleeping.  HENT:  Head: Normocephalic and atraumatic.  Right Ear: External ear normal.  Left Ear: External ear normal.  Eyes: Conjunctivae are normal. Right eye exhibits no discharge. Left eye exhibits no discharge. No scleral icterus.  Neck: Neck supple. No tracheal deviation present.  Cardiovascular: Normal rate, regular rhythm and intact distal pulses.   Pulmonary/Chest: Effort normal and breath sounds normal. No stridor. No respiratory distress. She has no wheezes. She has no rales.  Abdominal: Soft. Bowel sounds are normal. She exhibits no distension. There is no tenderness. There is no rebound and no guarding.  Musculoskeletal: She exhibits no edema and no tenderness.  Neurological: She has normal strength. She appears lethargic. No cranial nerve deficit ( no gross defecits noted) or sensory deficit. She exhibits  normal muscle tone. She displays no seizure activity. Coordination normal. GCS eye subscore is 2. GCS verbal subscore is 4. GCS motor subscore is 6.       The patient is very lethargic, she does respond to painful stimuli and will answer questions briefly. She is rather confused and somnolent.  Skin: Skin is warm and dry. No rash noted.  Psychiatric: She has a normal mood and affect.    ED Course  Procedures (including critical care time)  Rate: 63  Rhythm: normal sinus rhythm  QRS Axis: normal  Intervals: Prolonged QT  ST/T Wave abnormalities: normal  Conduction Disutrbances:none  Narrative Interpretation: Normal except for QT prolongation  Old EKG Reviewed: QT prolongation is new  Labs Reviewed    URINALYSIS, ROUTINE W REFLEX MICROSCOPIC - Abnormal; Notable for the following:    APPearance CLOUDY (*)     Hgb urine dipstick TRACE (*)     Nitrite POSITIVE (*)     Leukocytes, UA MODERATE (*)     All other components within normal limits  URINE RAPID DRUG SCREEN (HOSP PERFORMED) - Abnormal; Notable for the following:    Benzodiazepines POSITIVE (*)     Tetrahydrocannabinol POSITIVE (*)     All other components within normal limits  CBC WITH DIFFERENTIAL - Abnormal; Notable for the following:    Hemoglobin 6.6 (*)     HCT 23.9 (*)     MCV 56.0 (*)     MCH 15.5 (*)     MCHC 27.6 (*)     RDW 21.4 (*)     All other components within normal limits  COMPREHENSIVE METABOLIC PANEL - Abnormal; Notable for the following:    Potassium 3.1 (*)     Glucose, Bld 121 (*)     All other components within normal limits  BLOOD GAS, ARTERIAL - Abnormal; Notable for the following:    Acid-base deficit 2.8 (*)     All other components within normal limits  URINE MICROSCOPIC-ADD ON - Abnormal; Notable for the following:    Bacteria, UA MANY (*)     All other components within normal limits  PREGNANCY, URINE  ETHANOL  ACETAMINOPHEN LEVEL  SALICYLATE LEVEL   No results found.     MDM  Case discussed  With the poison center.    May have extra pyramidal effects and hypotension.  Continue cardiac monitoring.  Qt prolongation more likely related to trazodone.  Will replace potassium.  Monitor for seizures.  Generally supportive care.   Urinalysis suggests urinary tract infection. This was a catheterized specimen. They'll start IV antibiotics. The patient be admitted for continued monitoring.       Celene Kras, MD 06/17/12 [phone]

## 2012-06-17 NOTE — ED Notes (Signed)
Pt's meds at nursing station.  Counted & verified by this RN & B. Revonda Standard, RN. Abilify 15mg  x 3 tabs Abilify 10mg  x 12.5 tabs Trazadone x 6 tabs Lamictal x 9 tabs  dph

## 2012-06-17 NOTE — ED Notes (Signed)
Family at bedside. 

## 2012-06-17 NOTE — H&P (Signed)
DATE OF ADMISSION:  06/17/2012  PCP:    Dorrene German, MD   Chief Complaint: Overdose   HPI: [name] [name] is an 26 y.o. female who was found by her roommate drowsy and difficult to arouse.  She has been severely depressed lately about her child, and bills.  She told her roommate she wanted to kill herself.  And she told her room-mate that she had taken 15 tablets of each medicine Trazodone, Abilify, and Naproxen.  The ingestion occurred 1.5 hours before arrival to the ED.  Poison Control was contacted by the EDP, and the recommendations were made for continued monitoring and supportive care measures.  She was referred for admission.    Past Medical History  Diagnosis Date  . Anemia   . Abscess of axilla     History reviewed. No pertinent past surgical history.  Medications:  HOME MEDS: Prior to Admission medications   Medication Sig Start Date End Date Taking? Authorizing Provider  ARIPiprazole (ABILIFY) 10 MG tablet Take 10 mg by mouth daily.   Yes Historical Provider, MD  benzocaine (ORAJEL) 10 % mucosal gel Use as directed 1 application in the mouth or throat as needed. Tooth pain   Yes Historical Provider, MD  ibuprofen (ADVIL,MOTRIN) 800 MG tablet Take 800 mg by mouth every 8 (eight) hours as needed.   Yes Historical Provider, MD  lamoTRIgine (LAMICTAL) 100 MG tablet Take 100 mg by mouth daily.   Yes Historical Provider, MD  naproxen (NAPROSYN) 500 MG tablet Take 500 mg by mouth 2 (two) times daily as needed. Pain   Yes Historical Provider, MD  traZODone (DESYREL) 50 MG tablet Take 50 mg by mouth at bedtime.   Yes Historical Provider, MD    Allergies:  Allergies  Allergen Reactions  . Amoxicillin Nausea And Vomiting  . Penicillins Nausea And Vomiting    Social History:   reports that she has been smoking.  She does not have any smokeless tobacco history on file. She reports that she does not drink alcohol or use illicit drugs.  Family History: History reviewed. No  pertinent family history.  Review of Systems: Unable to Obtain from the patient a this time.     Physical Exam:   GEN:  Obtunded thin 26 year old thin but well nourished and well developed American female, examined and in No Acute Distress Filed Vitals:   06/17/12 1718 06/17/12 1823 06/17/12 1930  BP: 117/81 117/54 102/60  Pulse: 62  62  Temp: 96.3 F (35.7 C) 98 F (36.7 C) 98.1 F (36.7 C)  TempSrc: Other (Comment) Other (Comment) Core (Comment)  Resp: 21 21 20   SpO2: 100% 99% 98%   Blood pressure 102/60, pulse 62, temperature 98.1 F (36.7 C), temperature source Core (Comment), resp. rate 20, last menstrual period 06/17/2012, SpO2 98.00%. PSYCH: SHe is Obtunded; HEENT: Normocephalic and Atraumatic, Mucous membranes pink; PERRLA; EOM intact; Fundi:  Benign;  No scleral icterus, Nares: Patent, Oropharynx: Clear, Fair Dentition, Neck:  FROM, no cervical lymphadenopathy nor thyromegaly or carotid bruit; no JVD; Breasts:: Not examined CHEST WALL: No tenderness CHEST: Normal respiration, clear to auscultation bilaterally HEART: Regular rate and rhythm; no murmurs rubs or gallops BACK: No kyphosis or scoliosis; no CVA tenderness ABDOMEN: Positive Bowel Sounds,  soft non-tender; no masses, no organomegaly. Rectal Exam: Not done EXTREMITIES: No bone or joint deformity; age-appropriate arthropathy of the hands and knees; no cyanosis, clubbing or edema; no ulcerations. Genitalia: not examined PULSES: 2+ and symmetric SKIN: Normal hydration  no rash or ulceration CNS: Cranial nerves 2-12 grossly intact no focal neurologic deficit   Labs & Imaging Results for orders placed during the hospital encounter of 06/17/12 (from the past 48 hour(s))  CBC WITH DIFFERENTIAL     Status: Abnormal   Collection Time   06/17/12  5:00 PM      Component Value Range Comment   WBC 6.0  4.0 - 10.5 K/uL    RBC 4.27  3.87 - 5.11 MIL/uL    Hemoglobin 6.6 (*) 12.0 - 15.0 g/dL    HCT 16.1 (*) 09.6 - 46.0 %     MCV 56.0 (*) 78.0 - 100.0 fL    MCH 15.5 (*) 26.0 - 34.0 pg    MCHC 27.6 (*) 30.0 - 36.0 g/dL    RDW 04.5 (*) 40.9 - 15.5 %    Platelets 358  150 - 400 K/uL    Neutrophils Relative 52  43 - 77 %    Lymphocytes Relative 40  12 - 46 %    Monocytes Relative 7  3 - 12 %    Eosinophils Relative 1  0 - 5 %    Basophils Relative 0  0 - 1 %    Neutro Abs 3.1  1.7 - 7.7 K/uL    Lymphs Abs 2.4  0.7 - 4.0 K/uL    Monocytes Absolute 0.4  0.1 - 1.0 K/uL    Eosinophils Absolute 0.1  0.0 - 0.7 K/uL    Basophils Absolute 0.0  0.0 - 0.1 K/uL    RBC Morphology POLYCHROMASIA PRESENT   TARGET CELLS   Smear Review SCHISTOCYTES NOTED ON SMEAR     COMPREHENSIVE METABOLIC PANEL     Status: Abnormal   Collection Time   06/17/12  5:00 PM      Component Value Range Comment   Sodium 141  135 - 145 mEq/L    Potassium 3.1 (*) 3.5 - 5.1 mEq/L    Chloride 109  96 - 112 mEq/L    CO2 22  19 - 32 mEq/L    Glucose, Bld 121 (*) 70 - 99 mg/dL    BUN 8  6 - 23 mg/dL    Creatinine, Ser 8.11  0.50 - 1.10 mg/dL    Calcium 8.9  8.4 - 91.4 mg/dL    Total Protein 6.7  6.0 - 8.3 g/dL    Albumin 3.6  3.5 - 5.2 g/dL    AST 12  0 - 37 U/L    ALT 8  0 - 35 U/L    Alkaline Phosphatase 42  39 - 117 U/L    Total Bilirubin 0.3  0.3 - 1.2 mg/dL    GFR calc non Af Amer >90  >90 mL/min    GFR calc Af Amer >90  >90 mL/min   ETHANOL     Status: Normal   Collection Time   06/17/12  5:00 PM      Component Value Range Comment   Alcohol, Ethyl (B) <11  0 - 11 mg/dL   MAGNESIUM     Status: Normal   Collection Time   06/17/12  5:00 PM      Component Value Range Comment   Magnesium 2.0  1.5 - 2.5 mg/dL   URINALYSIS, ROUTINE W REFLEX MICROSCOPIC     Status: Abnormal   Collection Time   06/17/12  5:55 PM      Component Value Range Comment   Color, Urine YELLOW  YELLOW    APPearance  CLOUDY (*) CLEAR    Specific Gravity, Urine 1.027  1.005 - 1.030    pH 6.0  5.0 - 8.0    Glucose, UA NEGATIVE  NEGATIVE mg/dL    Hgb urine dipstick TRACE  (*) NEGATIVE    Bilirubin Urine NEGATIVE  NEGATIVE    Ketones, ur NEGATIVE  NEGATIVE mg/dL    Protein, ur NEGATIVE  NEGATIVE mg/dL    Urobilinogen, UA 1.0  0.0 - 1.0 mg/dL    Nitrite POSITIVE (*) NEGATIVE    Leukocytes, UA MODERATE (*) NEGATIVE   PREGNANCY, URINE     Status: Normal   Collection Time   06/17/12  5:55 PM      Component Value Range Comment   Preg Test, Ur NEGATIVE  NEGATIVE   URINE RAPID DRUG SCREEN (HOSP PERFORMED)     Status: Abnormal   Collection Time   06/17/12  5:55 PM      Component Value Range Comment   Opiates NONE DETECTED  NONE DETECTED    Cocaine NONE DETECTED  NONE DETECTED    Benzodiazepines POSITIVE (*) NONE DETECTED    Amphetamines NONE DETECTED  NONE DETECTED    Tetrahydrocannabinol POSITIVE (*) NONE DETECTED    Barbiturates NONE DETECTED  NONE DETECTED   BLOOD GAS, ARTERIAL     Status: Abnormal   Collection Time   06/17/12  5:55 PM      Component Value Range Comment   FIO2 0.21      pH, Arterial 7.356  7.350 - 7.400    pCO2 arterial 40.0  35.0 - 45.0 mmHg    pO2, Arterial 96.3  80.0 - 100.0 mmHg    Bicarbonate 21.8  20.0 - 24.0 mEq/L    TCO2 21.5  0 - 100 mmol/L    Acid-base deficit 2.8 (*) 0.0 - 2.0 mmol/L    O2 Saturation 97.6      Patient temperature 98.6      Collection site LEFT RADIAL      Drawn by [id number]      Sample type ARTERIAL DRAW      Allens test (pass/fail) PASS  PASS   URINE MICROSCOPIC-ADD ON     Status: Abnormal   Collection Time   06/17/12  5:55 PM      Component Value Range Comment   WBC, UA 7-10  <3 WBC/hpf    RBC / HPF 0-2  <3 RBC/hpf    Bacteria, UA MANY (*) RARE    Urine-Other MUCOUS PRESENT      No results found.    Assessment: Present on Admission:  .Overdose .Microcytic anemia .Hypokalemia .Severe major depression .Tobacco abuse  Plan:    Admit to Stepdown Bed for Monitoring for Cardiac and Respiratory changes Suicide Precautions and 1:1 Sitter.   IVFs, IV Protonix, NPO while Obtunded Transfuse 2 units,  PRBCs, and  FOBT q day X 3, Diff Dx: Menorrhagia most likely , but possibly GI.   Replete KCl Psych evaluation when Alert and Medically clear Tobacco Cessation Counselling.   Other plans as per orders.    CODE STATUS:      FULL CODE       Critical care time: 60 minutes.  Edwena Mayorga C 06/17/2012, 8:48 PM

## 2012-06-17 NOTE — ED Notes (Signed)
Per EMS, pt took 15 each Abilify, Trazadone, & Naproxen just PTA.  Pt was responsive when EMS arrived, and had experienced one episdoe of vomiting. dph

## 2012-06-18 ENCOUNTER — Encounter (HOSPITAL_COMMUNITY): Payer: Self-pay | Admitting: *Deleted

## 2012-06-18 DIAGNOSIS — F1410 Cocaine abuse, uncomplicated: Secondary | ICD-10-CM

## 2012-06-18 DIAGNOSIS — F329 Major depressive disorder, single episode, unspecified: Secondary | ICD-10-CM

## 2012-06-18 DIAGNOSIS — F3289 Other specified depressive episodes: Secondary | ICD-10-CM

## 2012-06-18 DIAGNOSIS — D509 Iron deficiency anemia, unspecified: Secondary | ICD-10-CM

## 2012-06-18 LAB — CBC
HCT: 28.4 % — ABNORMAL LOW (ref 36.0–46.0)
MCV: 62.3 fL — ABNORMAL LOW (ref 78.0–100.0)
Platelets: 327 10*3/uL (ref 150–400)
RBC: 4.56 MIL/uL (ref 3.87–5.11)
RDW: 27.6 % — ABNORMAL HIGH (ref 11.5–15.5)
WBC: 6.9 10*3/uL (ref 4.0–10.5)

## 2012-06-18 LAB — BASIC METABOLIC PANEL
CO2: 20 mEq/L (ref 19–32)
Chloride: 111 mEq/L (ref 96–112)
Creatinine, Ser: 0.56 mg/dL (ref 0.50–1.10)
GFR calc Af Amer: >90 mL/min (ref >90)
Sodium: 138 mEq/L (ref 135–145)

## 2012-06-18 LAB — MRSA PCR SCREENING: MRSA by PCR: NEGATIVE

## 2012-06-18 MED ORDER — DEXTROSE 5 % IV SOLN
1.0000 g | INTRAVENOUS | Status: DC
  Administered 2012-06-18: 1 g via INTRAVENOUS
  Filled 2012-06-18: qty 10

## 2012-06-18 MED ORDER — SODIUM CHLORIDE 0.9 % IV SOLN
INTRAVENOUS | Status: AC
  Administered 2012-06-18: 150 mL/h via INTRAVENOUS

## 2012-06-18 MED ORDER — ONDANSETRON HCL 4 MG/2ML IJ SOLN: 4.0000 mg | Freq: Three times a day (TID) | INTRAMUSCULAR | Status: AC | PRN

## 2012-06-18 NOTE — Progress Notes (Addendum)
Patient ID: [name] [name], female   DOB: 1986/09/22, 26 y.o.   MRN: [id number] \ [facility] PROGRESS NOTE  [name] [name] WUJ:[id number] DOB: 1986/04/25 DOA: 06/17/2012 PCP: Dorrene German, MD  Brief narrative: Pt is 26 y.o. female who was admitted to [facility] 06/17/2012 after she was found by her roommate drowsy and difficult to arouse. She has been severely depressed lately about her child, and bills and has been having suicidal ideations. She has apparently taken 15 tablets of each medicine Trazodone, Abilify, and Naproxen. Poison Control was contacted in ED.  Assessment/Plan:  Principal Problem:  *Overdose - psych consulted, will follow up on recommendations - continue to provide supportive care  Active Problems:  Hypokalemia - this was adequately supplemented - will check BMP in AM   UTI - will continue Ciprofloxacin day 2   Suicidal ideation - sitter at bedside   Microcytic anemia - Hg up from yesterday - obtain CBC in AM - anemia panel pending   Severe major depression - counseling provided by me - psych also following   Tobacco abuse - cessation consultation provided by me  Consultants:  Psych  Procedures:  None  Antibiotics:  Ciprofloxacin 06/17/2012 -- >  Code Status: Full Family Communication: Pt at bedside Disposition Plan: Transfer to telemetry   Manson Passey, MD  [facility] Pager [phone]  If 7PM-7AM, please contact night-coverage [email or web address] Password [facility] 06/18/2012, 5:58 PM   LOS: 1 day   HPI/Subjective: No events overnight. Pt denies suicidal ideations at this time. Denies chest pain or shortness of breath.   Objective: Filed Vitals:   06/18/12 1400 06/18/12 1500 06/18/12 1600 06/18/12 1638  BP: 101/59 95/75 98/52  102/59  Pulse: 55 70 52 68  Temp: 98.8 F (37.1 C) 98.8 F (37.1 C) 98.8 F (37.1 C) 98.7 F (37.1 C)  TempSrc:    Oral  Resp: 17 17 20 18   Height:      Weight:      SpO2: 99% 99% 100% 100%     Intake/Output Summary (Last 24 hours) at 06/18/12 1758 Last data filed at 06/18/12 1736  Gross per 24 hour  Intake 4559.67 ml  Output    725 ml  Net 3834.67 ml    Exam:   General:  Pt is alert, follows commands appropriately, not in acute distress  Cardiovascular: Regular rhythm, bradycardic, S1/S2, no murmurs, no rubs, no gallops  Respiratory: Clear to auscultation bilaterally, no wheezing, no crackles, no rhonchi  Abdomen: Soft, non tender, non distended, bowel sounds present, no guarding  Extremities: No edema, pulses DP and PT palpable bilaterally  Neuro: Grossly nonfocal  Data Reviewed: Basic Metabolic Panel:  Lab 06/18/12 8657 06/17/12 1700  NA 138 141  K 3.7 3.1*  CL 111 109  CO2 20 22  GLUCOSE 89 121*  BUN 4* 8  CREATININE 0.56 0.51  CALCIUM 8.2* 8.9  MG -- 2.0  PHOS -- --   Liver Function Tests:  Lab 06/17/12 1700  AST 12  ALT 8  ALKPHOS 42  BILITOT 0.3  PROT 6.7  ALBUMIN 3.6   CBC:  Lab 06/18/12 0338 06/17/12 1700  WBC 6.9 6.0  NEUTROABS -- 3.1  HGB 8.4* 6.6*  HCT 28.4* 23.9*  MCV 62.3* 56.0*  PLT 327 358    Recent Results (from the past 240 hour(s))  MRSA PCR SCREENING     Status: Normal   Collection Time   06/18/12  1:00 AM      Component Value Range  Status Comment   MRSA by PCR NEGATIVE  NEGATIVE Final      Studies: No results found.  Scheduled Meds:   . sodium chloride  1,000 mL Intravenous Once  . sodium chloride   Intravenous STAT  . ciprofloxacin  400 mg Intravenous Q12H  . pantoprazole (PROTONIX) IV  40 mg Intravenous Q12H  . potassium chloride  10 mEq Intravenous Once  . potassium chloride  10 mEq Intravenous Q1 Hr x 3  . DISCONTD: cefTRIAXone (ROCEPHIN)  IV  1 g Intravenous Q24H   Continuous Infusions:   . sodium chloride 125 mL/hr at 06/18/12 1524  . DISCONTD: sodium chloride Stopped (06/17/12 2232)

## 2012-06-18 NOTE — Progress Notes (Signed)
Utilization review completed.  
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
## 2012-06-18 NOTE — Consult Note (Signed)
Reason for Consult: OD Referring Physician: unknown  [name] [name] is an 26 y.o. female.  HPI: [name] [name] is an 26 y.o. female who was found by her roommate drowsy and difficult to arouse. She has been severely depressed lately about her child, and bills. She told her roommate she wanted to kill herself. And she told her room-mate that she had taken 15 tablets of each medicine Trazodone, Abilify, and Naproxen. She admitted this OD as SI attempt.  Very sleepy now. Not able to talk much. Wants more time to talk more. Reports stopping meds 1 years ago because of some side effects. No psy f/u for the last 1 year now.    Past Medical History  Diagnosis Date  . Anemia   . Abscess of axilla     History reviewed. No pertinent past surgical history.  Family History  Problem Relation Age of Onset  . Hypertension Mother   . Diabetes type II Mother     Social History:  reports that she has been smoking Cigarettes.  She has a 2.25 pack-year smoking history. She does not have any smokeless tobacco history on file. She reports that she drinks alcohol. She reports that she uses illicit drugs (Marijuana) about 5 times per week.  Allergies:  Allergies  Allergen Reactions  . Amoxicillin Nausea And Vomiting  . Penicillins Nausea And Vomiting    Medications: I have reviewed the patient's current medications.  Results for orders placed during the hospital encounter of 06/17/12 (from the past 48 hour(s))  CBC WITH DIFFERENTIAL     Status: Abnormal   Collection Time   06/17/12  5:00 PM      Component Value Range Comment   WBC 6.0  4.0 - 10.5 K/uL    RBC 4.27  3.87 - 5.11 MIL/uL    Hemoglobin 6.6 (*) 12.0 - 15.0 g/dL    HCT 78.2 (*) 95.6 - 46.0 %    MCV 56.0 (*) 78.0 - 100.0 fL    MCH 15.5 (*) 26.0 - 34.0 pg    MCHC 27.6 (*) 30.0 - 36.0 g/dL    RDW 21.3 (*) 08.6 - 15.5 %    Platelets 358  150 - 400 K/uL    Neutrophils Relative 52  43 - 77 %    Lymphocytes Relative 40  12 - 46 %    Monocytes Relative 7  3 - 12 %    Eosinophils Relative 1  0 - 5 %    Basophils Relative 0  0 - 1 %    Neutro Abs 3.1  1.7 - 7.7 K/uL    Lymphs Abs 2.4  0.7 - 4.0 K/uL    Monocytes Absolute 0.4  0.1 - 1.0 K/uL    Eosinophils Absolute 0.1  0.0 - 0.7 K/uL    Basophils Absolute 0.0  0.0 - 0.1 K/uL    RBC Morphology POLYCHROMASIA PRESENT   TARGET CELLS   Smear Review SCHISTOCYTES NOTED ON SMEAR     COMPREHENSIVE METABOLIC PANEL     Status: Abnormal   Collection Time   06/17/12  5:00 PM      Component Value Range Comment   Sodium 141  135 - 145 mEq/L    Potassium 3.1 (*) 3.5 - 5.1 mEq/L    Chloride 109  96 - 112 mEq/L    CO2 22  19 - 32 mEq/L    Glucose, Bld 121 (*) 70 - 99 mg/dL    BUN 8  6 -  23 mg/dL    Creatinine, Ser 4.09  0.50 - 1.10 mg/dL    Calcium 8.9  8.4 - 81.1 mg/dL    Total Protein 6.7  6.0 - 8.3 g/dL    Albumin 3.6  3.5 - 5.2 g/dL    AST 12  0 - 37 U/L    ALT 8  0 - 35 U/L    Alkaline Phosphatase 42  39 - 117 U/L    Total Bilirubin 0.3  0.3 - 1.2 mg/dL    GFR calc non Af Amer >90  >90 mL/min    GFR calc Af Amer >90  >90 mL/min   ETHANOL     Status: Normal   Collection Time   06/17/12  5:00 PM      Component Value Range Comment   Alcohol, Ethyl (B) <11  0 - 11 mg/dL   MAGNESIUM     Status: Normal   Collection Time   06/17/12  5:00 PM      Component Value Range Comment   Magnesium 2.0  1.5 - 2.5 mg/dL   URINALYSIS, ROUTINE W REFLEX MICROSCOPIC     Status: Abnormal   Collection Time   06/17/12  5:55 PM      Component Value Range Comment   Color, Urine YELLOW  YELLOW    APPearance CLOUDY (*) CLEAR    Specific Gravity, Urine 1.027  1.005 - 1.030    pH 6.0  5.0 - 8.0    Glucose, UA NEGATIVE  NEGATIVE mg/dL    Hgb urine dipstick TRACE (*) NEGATIVE    Bilirubin Urine NEGATIVE  NEGATIVE    Ketones, ur NEGATIVE  NEGATIVE mg/dL    Protein, ur NEGATIVE  NEGATIVE mg/dL    Urobilinogen, UA 1.0  0.0 - 1.0 mg/dL    Nitrite POSITIVE (*) NEGATIVE    Leukocytes, UA MODERATE (*)  NEGATIVE   PREGNANCY, URINE     Status: Normal   Collection Time   06/17/12  5:55 PM      Component Value Range Comment   Preg Test, Ur NEGATIVE  NEGATIVE   URINE RAPID DRUG SCREEN (HOSP PERFORMED)     Status: Abnormal   Collection Time   06/17/12  5:55 PM      Component Value Range Comment   Opiates NONE DETECTED  NONE DETECTED    Cocaine NONE DETECTED  NONE DETECTED    Benzodiazepines POSITIVE (*) NONE DETECTED    Amphetamines NONE DETECTED  NONE DETECTED    Tetrahydrocannabinol POSITIVE (*) NONE DETECTED    Barbiturates NONE DETECTED  NONE DETECTED   BLOOD GAS, ARTERIAL     Status: Abnormal   Collection Time   06/17/12  5:55 PM      Component Value Range Comment   FIO2 0.21      pH, Arterial 7.356  7.350 - 7.400    pCO2 arterial 40.0  35.0 - 45.0 mmHg    pO2, Arterial 96.3  80.0 - 100.0 mmHg    Bicarbonate 21.8  20.0 - 24.0 mEq/L    TCO2 21.5  0 - 100 mmol/L    Acid-base deficit 2.8 (*) 0.0 - 2.0 mmol/L    O2 Saturation 97.6      Patient temperature 98.6      Collection site LEFT RADIAL      Drawn by [id number]      Sample type ARTERIAL DRAW      Allens test (pass/fail) PASS  PASS   URINE MICROSCOPIC-ADD ON  Status: Abnormal   Collection Time   06/17/12  5:55 PM      Component Value Range Comment   WBC, UA 7-10  <3 WBC/hpf    RBC / HPF 0-2  <3 RBC/hpf    Bacteria, UA MANY (*) RARE    Urine-Other MUCOUS PRESENT     ACETAMINOPHEN LEVEL     Status: Normal   Collection Time   06/17/12  8:25 PM      Component Value Range Comment   Acetaminophen (Tylenol), Serum <15.0  10 - 30 ug/mL   SALICYLATE LEVEL     Status: Abnormal   Collection Time   06/17/12  8:25 PM      Component Value Range Comment   Salicylate Lvl <2.0 (*) 2.8 - 20.0 mg/dL   OCCULT BLOOD, POC DEVICE     Status: Normal   Collection Time   06/17/12  8:45 PM      Component Value Range Comment   Fecal Occult Bld NEGATIVE     PREPARE RBC (CROSSMATCH)     Status: Normal   Collection Time   06/17/12  9:28 PM       Component Value Range Comment   Order Confirmation ORDER PROCESSED BY BLOOD BANK     TYPE AND SCREEN     Status: Normal (Preliminary result)   Collection Time   06/17/12  9:28 PM      Component Value Range Comment   ABO/RH(D) O POS      Antibody Screen NEG      Sample Expiration 06/20/2012      Unit Number [id number]      Blood Component Type RED CELLS,LR      Unit division 00      Status of Unit ISSUED,FINAL      Transfusion Status OK TO TRANSFUSE      Crossmatch Result Compatible      Unit Number [id number]      Blood Component Type RBC LR PHER1      Unit division 00      Status of Unit ISSUED      Transfusion Status OK TO TRANSFUSE      Crossmatch Result Compatible     ABO/RH     Status: Normal   Collection Time   06/17/12  9:28 PM      Component Value Range Comment   ABO/RH(D) O POS     MRSA PCR SCREENING     Status: Normal   Collection Time   06/18/12  1:00 AM      Component Value Range Comment   MRSA by PCR NEGATIVE  NEGATIVE   BASIC METABOLIC PANEL     Status: Abnormal   Collection Time   06/18/12  3:38 AM      Component Value Range Comment   Sodium 138  135 - 145 mEq/L    Potassium 3.7  3.5 - 5.1 mEq/L    Chloride 111  96 - 112 mEq/L    CO2 20  19 - 32 mEq/L    Glucose, Bld 89  70 - 99 mg/dL    BUN 4 (*) 6 - 23 mg/dL    Creatinine, Ser 0.86  0.50 - 1.10 mg/dL    Calcium 8.2 (*) 8.4 - 10.5 mg/dL    GFR calc non Af Amer >90  >90 mL/min    GFR calc Af Amer >90  >90 mL/min   CBC     Status: Abnormal   Collection Time   06/18/12  3:38 AM      Component Value Range Comment   WBC 6.9  4.0 - 10.5 K/uL    RBC 4.56  3.87 - 5.11 MIL/uL    Hemoglobin 8.4 (*) 12.0 - 15.0 g/dL    HCT 30.8 (*) 65.7 - 46.0 %    MCV 62.3 (*) 78.0 - 100.0 fL    MCH 18.4 (*) 26.0 - 34.0 pg    MCHC 29.6 (*) 30.0 - 36.0 g/dL    RDW 84.6 (*) 96.2 - 15.5 %    Platelets 327  150 - 400 K/uL   RETICULOCYTES     Status: Abnormal   Collection Time   06/18/12  7:59 PM      Component Value Range Comment    Retic Ct Pct <0.4 (*) 0.4 - 3.1 % RESULTS CONFIRMED BY MANUAL DILUTION   RBC. <1.00 (*) 3.87 - 5.11 MIL/uL RESULTS CONFIRMED BY MANUAL DILUTION   Retic Count, Manual NOT CALCULATED  19.0 - 186.0 K/uL     No results found.  ROS Blood pressure 99/63, pulse 60, temperature 98.5 F (36.9 C), temperature source Oral, resp. rate 18, height 5\' 5"  (1.651 m), weight 58.4 kg (128 lb 12 oz), last menstrual period 06/17/2012, SpO2 100.00%. Physical Exam  Alert, lying on bed Mental Status Examination/Evaluation:  Objective: Appearance:  sleepy Psychomotor Activity: limited Eye Contact::poor Speech: Normal Rate  Volume: low Mood: sad Affect: ristricted  Thought Process: Clear rational goal orieneted  Orientation: Full  Thought Content: denies AVH  Suicidal Thoughts: No  Homicidal Thoughts: No  Judgement: Impaired  Insight: poor   DIAGNOSIS:   AXIS I Cannabis abuse,  dep d/o nos AXIS II def  AXIS III See medical history.  AXIS IV unknown  AXIS V 30   Treatment Plan Summary:   1. This was a lethal planned SI attempt. Pt has multiple stressors at this time. Will recommend in pt psy transfer for safety and further evaluation after medical clearance 2. Continue 1;1 obs for safety 3. Will follow tomorrow  And will try recommend meds after getting more hx  Wonda Cerise 06/18/2012, 10:01 PM

## 2012-06-19 ENCOUNTER — Inpatient Hospital Stay (HOSPITAL_COMMUNITY)
Admission: AD | Admit: 2012-06-19 | Discharge: 2012-06-22 | DRG: 885 | Disposition: A | Discharge status: Home / Self Care | Payer: Federal, State, Local not specified - Other | Source: Ambulatory Visit | Attending: Psychiatry | Admitting: Psychiatry

## 2012-06-19 ENCOUNTER — Encounter (HOSPITAL_COMMUNITY): Payer: Self-pay | Admitting: *Deleted

## 2012-06-19 DIAGNOSIS — T50901A Poisoning by unspecified drugs, medicaments and biological substances, accidental (unintentional), initial encounter: Secondary | ICD-10-CM

## 2012-06-19 DIAGNOSIS — F411 Generalized anxiety disorder: Secondary | ICD-10-CM | POA: Diagnosis present

## 2012-06-19 DIAGNOSIS — F1210 Cannabis abuse, uncomplicated: Secondary | ICD-10-CM | POA: Diagnosis present

## 2012-06-19 DIAGNOSIS — F322 Major depressive disorder, single episode, severe without psychotic features: Secondary | ICD-10-CM

## 2012-06-19 DIAGNOSIS — F3130 Bipolar disorder, current episode depressed, mild or moderate severity, unspecified: Principal | ICD-10-CM | POA: Diagnosis present

## 2012-06-19 DIAGNOSIS — Z720 Tobacco use: Secondary | ICD-10-CM

## 2012-06-19 DIAGNOSIS — T1491XA Suicide attempt, initial encounter: Secondary | ICD-10-CM

## 2012-06-19 DIAGNOSIS — N390 Urinary tract infection, site not specified: Secondary | ICD-10-CM | POA: Diagnosis present

## 2012-06-19 DIAGNOSIS — F17200 Nicotine dependence, unspecified, uncomplicated: Secondary | ICD-10-CM | POA: Diagnosis present

## 2012-06-19 DIAGNOSIS — Z79899 Other long term (current) drug therapy: Secondary | ICD-10-CM

## 2012-06-19 HISTORY — DX: Urinary tract infection, site not specified: N39.0

## 2012-06-19 HISTORY — DX: Suicide attempt, initial encounter: T14.91XA

## 2012-06-19 LAB — CBC
HCT: 27.4 % — ABNORMAL LOW (ref 36.0–46.0)
Hemoglobin: 8.1 g/dL — ABNORMAL LOW (ref 12.0–15.0)
MCV: 62.3 fL — ABNORMAL LOW (ref 78.0–100.0)
WBC: 5.9 10*3/uL (ref 4.0–10.5)

## 2012-06-19 LAB — TYPE AND SCREEN
ABO/RH(D): O POS
Antibody Screen: NEGATIVE
Unit division: 0

## 2012-06-19 LAB — BASIC METABOLIC PANEL
BUN: 3 mg/dL — ABNORMAL LOW (ref 6–23)
Chloride: 110 mEq/L (ref 96–112)
Creatinine, Ser: 0.67 mg/dL (ref 0.50–1.10)
Glucose, Bld: 86 mg/dL (ref 70–99)
Potassium: 3.7 mEq/L (ref 3.5–5.1)

## 2012-06-19 LAB — IRON AND TIBC
Iron: 11 ug/dL — ABNORMAL LOW (ref 42–135)
Saturation Ratios: 6 % — ABNORMAL LOW (ref 20–55)
TIBC: 197 ug/dL — ABNORMAL LOW (ref 250–470)
UIBC: 186 ug/dL (ref 125–400)

## 2012-06-19 LAB — URINE CULTURE

## 2012-06-19 LAB — RETICULOCYTES: Retic Ct Pct: <0.4 % — ABNORMAL LOW (ref 0.4–3.1)

## 2012-06-19 LAB — FERRITIN: Ferritin: 34 ng/mL (ref 10–291)

## 2012-06-19 MED ORDER — CIPROFLOXACIN HCL 500 MG PO TABS: 500.0000 mg | ORAL_TABLET | Freq: Two times a day (BID) | ORAL | Status: DC

## 2012-06-19 MED ORDER — ACETAMINOPHEN 325 MG PO TABS: 650.0000 mg | ORAL_TABLET | Freq: Four times a day (QID) | ORAL | Status: DC | PRN

## 2012-06-19 MED ORDER — BENZOCAINE 10 % MT GEL: 1.0000 "application " | OROMUCOSAL | Status: DC | PRN

## 2012-06-19 MED ORDER — ALUM & MAG HYDROXIDE-SIMETH 200-200-20 MG/5ML PO SUSP: 30.0000 mL | ORAL | Status: DC | PRN

## 2012-06-19 MED ORDER — LAMOTRIGINE 100 MG PO TABS
100.0000 mg | ORAL_TABLET | Freq: Every day | ORAL | Status: DC
  Administered 2012-06-20 – 2012-06-22 (×3): 100 mg via ORAL
  Filled 2012-06-19 (×2): qty 14
  Filled 2012-06-19 (×4): qty 1

## 2012-06-19 MED ORDER — MAGNESIUM HYDROXIDE 400 MG/5ML PO SUSP: 30.0000 mL | Freq: Every day | ORAL | Status: DC | PRN

## 2012-06-19 MED ORDER — HYDROXYZINE HCL 50 MG PO TABS
50.0000 mg | ORAL_TABLET | Freq: Every evening | ORAL | Status: DC | PRN
  Administered 2012-06-20 – 2012-06-21 (×2): 50 mg via ORAL
  Filled 2012-06-19: qty 1
  Filled 2012-06-19: qty 28

## 2012-06-19 MED ORDER — CIPROFLOXACIN HCL 500 MG PO TABS
500.0000 mg | ORAL_TABLET | Freq: Two times a day (BID) | ORAL | Status: DC
  Administered 2012-06-19 – 2012-06-22 (×6): 500 mg via ORAL
  Filled 2012-06-19 (×3): qty 1
  Filled 2012-06-19: qty 20
  Filled 2012-06-19 (×2): qty 1
  Filled 2012-06-19: qty 20
  Filled 2012-06-19 (×2): qty 1
  Filled 2012-06-19: qty 20
  Filled 2012-06-19: qty 1
  Filled 2012-06-19: qty 20
  Filled 2012-06-19: qty 1

## 2012-06-19 MED ORDER — NICOTINE POLACRILEX 2 MG MT GUM
2.0000 mg | CHEWING_GUM | OROMUCOSAL | Status: DC | PRN
  Administered 2012-06-20 – 2012-06-22 (×7): 2 mg via ORAL
  Filled 2012-06-19: qty 1

## 2012-06-19 MED ORDER — NAPROXEN 500 MG PO TABS: 500.0000 mg | ORAL_TABLET | Freq: Two times a day (BID) | ORAL | Status: DC | PRN

## 2012-06-19 NOTE — Consult Note (Signed)
Reason for Consult: OD Referring Physician: unknown  [name] [name] is an 26 y.o. female.  HPI: [name] [name] is an 26 y.o. female who was found by her roommate drowsy and difficult to arouse. She has been severely depressed lately about her child, and bills. She told her roommate she wanted to kill herself.  Interval Hx:   Chart was reviewed and talked with her primary MD. Able to talk more.  Feels depressed and stressed out. Willing to go to psy in pt now.    Past Medical History  Diagnosis Date  . Anemia   . Abscess of axilla   . Depression     History reviewed. No pertinent past surgical history.  Family History  Problem Relation Age of Onset  . Hypertension Mother   . Diabetes type II Mother     Social History:  reports that she has been smoking Cigarettes.  She has a 4.5 pack-year smoking history. She does not have any smokeless tobacco history on file. She reports that she drinks alcohol. She reports that she uses illicit drugs (Marijuana) about 5 times per week.  Allergies:  Allergies  Allergen Reactions  . Amoxicillin Nausea And Vomiting  . Penicillins Nausea And Vomiting    Medications: I have reviewed the patient's current medications.  Results for orders placed during the hospital encounter of 06/17/12 (from the past 48 hour(s))  MRSA PCR SCREENING     Status: Normal   Collection Time   06/18/12  1:00 AM      Component Value Range Comment   MRSA by PCR NEGATIVE  NEGATIVE   BASIC METABOLIC PANEL     Status: Abnormal   Collection Time   06/18/12  3:38 AM      Component Value Range Comment   Sodium 138  135 - 145 mEq/L    Potassium 3.7  3.5 - 5.1 mEq/L    Chloride 111  96 - 112 mEq/L    CO2 20  19 - 32 mEq/L    Glucose, Bld 89  70 - 99 mg/dL    BUN 4 (*) 6 - 23 mg/dL    Creatinine, Ser 1.61  0.50 - 1.10 mg/dL    Calcium 8.2 (*) 8.4 - 10.5 mg/dL    GFR calc non Af Amer >90  >90 mL/min    GFR calc Af Amer >90  >90 mL/min   CBC     Status: Abnormal   Collection Time   06/18/12  3:38 AM      Component Value Range Comment   WBC 6.9  4.0 - 10.5 K/uL    RBC 4.56  3.87 - 5.11 MIL/uL    Hemoglobin 8.4 (*) 12.0 - 15.0 g/dL    HCT 09.6 (*) 04.5 - 46.0 %    MCV 62.3 (*) 78.0 - 100.0 fL    MCH 18.4 (*) 26.0 - 34.0 pg    MCHC 29.6 (*) 30.0 - 36.0 g/dL    RDW 40.9 (*) 81.1 - 15.5 %    Platelets 327  150 - 400 K/uL   URINE CULTURE     Status: Normal   Collection Time   06/18/12  8:54 AM      Component Value Range Comment   Specimen Description URINE, CLEAN CATCH      Special Requests NONE      Culture  Setup Time 06/18/2012 14:24      Colony Count NO GROWTH      Culture NO GROWTH  Report Status 06/19/2012 FINAL     VITAMIN B12     Status: Normal   Collection Time   06/18/12  7:59 PM      Component Value Range Comment   Vitamin B-12 656  211 - 911 pg/mL   FOLATE     Status: Normal   Collection Time   06/18/12  7:59 PM      Component Value Range Comment   Folate 7.9     IRON AND TIBC     Status: Abnormal   Collection Time   06/18/12  7:59 PM      Component Value Range Comment   Iron 11 (*) 42 - 135 ug/dL    TIBC 213 (*) 086 - 578 ug/dL    Saturation Ratios 6 (*) 20 - 55 %    UIBC 186  125 - 400 ug/dL   FERRITIN     Status: Normal   Collection Time   06/18/12  7:59 PM      Component Value Range Comment   Ferritin 34  10 - 291 ng/mL   RETICULOCYTES     Status: Abnormal   Collection Time   06/18/12  7:59 PM      Component Value Range Comment   Retic Ct Pct <0.4 (*) 0.4 - 3.1 % RESULTS CONFIRMED BY MANUAL DILUTION   RBC. 4.65  3.87 - 5.11 MIL/uL    Retic Count, Manual NOT CALCULATED  19.0 - 186.0 K/uL   CBC     Status: Abnormal   Collection Time   06/19/12  5:02 AM      Component Value Range Comment   WBC 5.9  4.0 - 10.5 K/uL WHITE COUNT CONFIRMED ON SMEAR   RBC 4.40  3.87 - 5.11 MIL/uL    Hemoglobin 8.1 (*) 12.0 - 15.0 g/dL    HCT 46.9 (*) 62.9 - 46.0 %    MCV 62.3 (*) 78.0 - 100.0 fL    MCH 18.4 (*) 26.0 - 34.0 pg    MCHC 29.6  (*) 30.0 - 36.0 g/dL    RDW 52.8 (*) 41.3 - 15.5 %    Platelets 307  150 - 400 K/uL   BASIC METABOLIC PANEL     Status: Abnormal   Collection Time   06/19/12  5:02 AM      Component Value Range Comment   Sodium 136  135 - 145 mEq/L    Potassium 3.7  3.5 - 5.1 mEq/L    Chloride 110  96 - 112 mEq/L    CO2 20  19 - 32 mEq/L    Glucose, Bld 86  70 - 99 mg/dL    BUN 3 (*) 6 - 23 mg/dL    Creatinine, Ser 2.44  0.50 - 1.10 mg/dL    Calcium 8.4  8.4 - 01.0 mg/dL    GFR calc non Af Amer >90  >90 mL/min    GFR calc Af Amer >90  >90 mL/min     No results found.  ROS  Blood pressure 105/68, pulse 64, temperature 98.8 F (37.1 C), temperature source Oral, resp. rate 18, height 5\' 5"  (1.651 m), weight 58.4 kg (128 lb 12 oz), last menstrual period 06/17/2012, SpO2 99.00%. Physical Exam   Alert, lying on bed Mental Status Examination/Evaluation:  Objective: Appearance:  sleepy Psychomotor Activity: limited Eye Contact::poor Speech: Normal Rate  Volume: low Mood: sad Affect: ristricted  Thought Process: Clear rational goal orieneted  Orientation: Full  Thought Content: denies AVH  Suicidal  Thoughts: No  Homicidal Thoughts: No  Judgement: Impaired  Insight: poor   DIAGNOSIS:   AXIS I Cannabis abuse,  dep d/o nos AXIS II def  AXIS III See medical history.  AXIS IV unknown  AXIS V 30   Treatment Plan Summary:   1. This was a lethal planned SI attempt. Pt has multiple stressors at this time. Will continue to recommend in pt psy transfer for safety and further evaluation and treatment as pt is off meds after medical clearance 2. Continue 1;1 obs for safety 3. Will sign off  Wonda Cerise 06/19/2012, 9:51 PM

## 2012-06-19 NOTE — Tx Team (Signed)
Initial Interdisciplinary Treatment Plan  PATIENT STRENGTHS: (choose at least two) Ability for insight Average or above average intelligence Communication skills Financial means General fund of knowledge Religious Affiliation Supportive family/friends Work skills  PATIENT STRESSORS: Health problems Traumatic event   PROBLEM LIST: Problem List/Patient Goals Date to be addressed Date deferred Reason deferred Estimated date of resolution  Suicidal Attempt, OD on pills 06-19-12     Depression 06-19-12     Anger 06-19-12     THC(occasinal) 06-19-12     Anemia 06-19-12                              DISCHARGE CRITERIA:  Ability to meet basic life and health needs Adequate post-discharge living arrangements Improved stabilization in mood, thinking, and/or behavior Medical problems require only outpatient monitoring Need for constant or close observation no longer present Reduction of life-threatening or endangering symptoms to within safe limits Verbal commitment to aftercare and medication compliance  PRELIMINARY DISCHARGE PLAN: Attend aftercare/continuing care group Outpatient therapy Participate in family therapy Return to previous living arrangement Return to previous work or school arrangements  PATIENT/FAMIILY INVOLVEMENT: This treatment plan has been presented to and reviewed with the patient, [name] [name], and/or family member.  The patient and family have been given the opportunity to ask questions and make suggestions.  Mickeal Needy 06/19/2012, 8:41 PM
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
## 2012-06-19 NOTE — Progress Notes (Signed)
Per MD, Pt ready for d/c.  Pt agrees to [facility] transfer.  Per [facility], Pt accepted to bed 508-2.  Notified RN and Pt.  Pt signed consent.  Faxed consent to [facility].  RN to give report.  Pt to be d/c'd.  Providence Crosby, LCSWA Clinical Social Work [phone]
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
## 2012-06-19 NOTE — Discharge Summary (Addendum)
Patient ID: [name] [name] MRN: [id number] DOB/AGE: 02/01/1986 26 y.o.  Admit date: 06/17/2012 Discharge date: 06/19/2012  Primary Care Physician:  Dorrene German, MD  Discharge Diagnoses:     Principal Problem:  *Suicide attempt  Drug overdose  Active Problems:   Severe major depression  Hypokalemia  UTI (lower urinary tract infection)  Microcytic anemia  Tobacco abuse   Medication List  As of 06/19/2012 11:37 AM   STOP taking these medications         ARIPiprazole 10 MG tablet      ibuprofen 800 MG tablet      traZODone 50 MG tablet         TAKE these medications         benzocaine 10 % mucosal gel   Commonly known as: ORAJEL   Use as directed 1 application in the mouth or throat as needed. Tooth pain      ciprofloxacin 500 MG tablet   Commonly known as: CIPRO   Take 1 tablet (500 mg total) by mouth 2 (two) times daily.      lamoTRIgine 100 MG tablet   Commonly known as: LAMICTAL   Take 100 mg by mouth daily.      naproxen 500 MG tablet   Commonly known as: NAPROSYN   Take 500 mg by mouth 2 (two) times daily as needed. Pain            Disposition and Follow-up:  D/C to [facility]  Consults:  Rasul ( psych)  Significant Diagnostic Studies:  No results found.  Brief H and P: For complete details please refer to admission H and P, but in brief 26 y.o. female who was found by her roommate drowsy and difficult to arouse. She has been severely depressed lately about her child, and bills. She told her roommate she wanted to kill herself. And she told her room-mate that she had taken 15 tablets of each medicine Trazodone, Abilify, and Naproxen. The ingestion occurred 1.5 hours before arrival to the ED. Poison Control was contacted by the EDP, and the recommendations were made for continued monitoring and supportive care measures.   Physical Exam on Discharge:  Filed Vitals:   06/18/12 1838 06/18/12 2149 06/19/12 0519 06/19/12 0653  BP: 110/66 99/63 111/50  156/87  Pulse: 54 60 54 79  Temp: 98.2 F (36.8 C) 98.5 F (36.9 C) 98.2 F (36.8 C) 97.8 F (36.6 C)  TempSrc: Oral Oral Oral Oral  Resp: 20 18 16 18   Height:      Weight:      SpO2: 100% 100% 100% 97%     Intake/Output Summary (Last 24 hours) at 06/19/12 1137 Last data filed at 06/19/12 0630  Gross per 24 hour  Intake 2952.5 ml  Output   1650 ml  Net 1302.5 ml    General: Alert, awake, oriented x3, in no acute distress. Denies being suicidal but feels depressed HEENT: No pallor, moist oral mucosa Heart: Regular rate and rhythm, without murmurs, rubs, gallops. Lungs: Clear to auscultation bilaterally. Abdomen: Soft, nontender, nondistended, positive bowel sounds. Extremities: No clubbing cyanosis or edema with positive pedal pulses. Neuro: Grossly intact, nonfocal.  CBC:    Component Value Date/Time   WBC 5.9 06/19/2012 0502   HGB 8.1* 06/19/2012 0502   HCT 27.4* 06/19/2012 0502   PLT 307 06/19/2012 0502   MCV 62.3* 06/19/2012 0502   NEUTROABS 3.1 06/17/2012 1700   LYMPHSABS 2.4 06/17/2012 1700   MONOABS 0.4 06/17/2012 1700  EOSABS 0.1 06/17/2012 1700   BASOSABS 0.0 06/17/2012 1700    Basic Metabolic Panel:    Component Value Date/Time   NA 136 06/19/2012 0502   K 3.7 06/19/2012 0502   CL 110 06/19/2012 0502   CO2 20 06/19/2012 0502   BUN 3* 06/19/2012 0502   CREATININE 0.67 06/19/2012 0502   GLUCOSE 86 06/19/2012 0502   CALCIUM 8.4 06/19/2012 0502    Hospital Course:   Suicidal attempt with Drug overdose patient admitted to telemetry with 1:1 Observation  she was started on IV fluids. informs taking 15 tablets of abilify, trazodone and motrin feeling very depressed.  -hemodynamically stable, with no abnormality noted on monitor except for few episodes of HR in 40s . -held all home antidepressants. - seen by psych consult and recommended transfer to [facility] once medically cleared for further management.    Hypokalemia  -replenished  UTI  - asymptomatic. Cultures pending. Planned  for treatment for 3 days with po ciprofloxacin. Follow cultures    Microcytic anemia  - iron panel suggests iron deficiency. Presented with hb of 6.6 and given 2 U PRBC . Stable at 8.1 today. Should be replenished with iron supplements once acute issues resolve and needs close outpatient follow up on discharge.   Severe major depression  -psych following  Tobacco abuse  - counseled on cessation  Patient medically stable and  can be discharged to inpatient psych  Time spent on Discharge: 40 minutes  Signed: Eddie North 06/19/2012, 11:37 AM

## 2012-06-19 NOTE — Progress Notes (Signed)
Discharge instructions accompanied pt, left the unit in stable condition. Pt d/c to [facility].

## 2012-06-20 DIAGNOSIS — F322 Major depressive disorder, single episode, severe without psychotic features: Secondary | ICD-10-CM

## 2012-06-20 DIAGNOSIS — F17200 Nicotine dependence, unspecified, uncomplicated: Secondary | ICD-10-CM

## 2012-06-20 MED ORDER — FERROUS SULFATE 325 (65 FE) MG PO TABS
325.0000 mg | ORAL_TABLET | Freq: Two times a day (BID) | ORAL | Status: DC
  Administered 2012-06-20 – 2012-06-22 (×4): 325 mg via ORAL
  Filled 2012-06-20 (×2): qty 28
  Filled 2012-06-20 (×2): qty 1
  Filled 2012-06-20 (×2): qty 28
  Filled 2012-06-20 (×4): qty 1

## 2012-06-20 MED ORDER — POLYETHYLENE GLYCOL 3350 17 G PO PACK
17.0000 g | PACK | Freq: Every day | ORAL | Status: DC
  Filled 2012-06-20 (×5): qty 1
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
## 2012-06-20 NOTE — Progress Notes (Signed)
[facility] MD Progress Note  06/20/2012 2:53 PM  Diagnosis:   Axis I: Bipolar, Depressed, Generalized Anxiety Disorder and Cannabis and Nicotine Dependence Axis II: Deferred Axis III:  Past Medical History  Diagnosis Date  . Anemia   . Abscess of axilla   . Depression    Axis IV: occupational problems, other psychosocial or environmental problems and problems related to social environment Axis V: 41-50 serious symptoms  ADL's:  Intact  Sleep: Poor  Appetite:  Fair  Suicidal Ideation:  Pt denies any thoughts, plans, intent of suicide Homicidal Ideation:  Pt denies any thoughts, plans, intent of homicide  Mental Status Examination/Evaluation: Objective:  Appearance: Casual  Eye Contact::  Good  Speech:  Clear and Coherent  Volume:  Normal  Mood:  Anxious, Depressed, Hopeless, Irritable and Worthless  Affect:  Congruent  Thought Process:  Coherent  Orientation:  Full  Thought Content:  Hallucinations: Auditory Visual  Suicidal Thoughts:  No  Homicidal Thoughts:  No  Memory:  Immediate;   Fair Recent;   Fair Remote;   Fair  Judgement:  Impaired  Insight:  Fair  Psychomotor Activity:  Normal  Concentration:  Fair  Recall:  Fair  Akathisia:  No  Handed:  Right  AIMS (if indicated):     Assets:  Communication Skills Desire for Improvement  Sleep:  Number of Hours: 5.5    Vital Signs:Blood pressure 123/82, pulse 55, temperature 97.2 F (36.2 C), temperature source Oral, resp. rate 16, last menstrual period 06/17/2012. Current Medications: Current Facility-Administered Medications  Medication Dose Route Frequency Provider Last Rate Last Dose  . acetaminophen (TYLENOL) tablet 650 mg  650 mg Oral Q6H PRN Mickie D. Adams, PA      . alum & mag hydroxide-simeth (MAALOX/MYLANTA) 200-200-20 MG/5ML suspension 30 mL  30 mL Oral Q4H PRN Mickie D. Adams, PA      . benzocaine (ORAJEL) 10 % mucosal gel 1 application  1 application Mouth/Throat PRN Mickie D. Adams, PA      .  ciprofloxacin (CIPRO) tablet 500 mg  500 mg Oral BID Mickie D. Adams, PA   500 mg at 06/20/12 0814  . ferrous sulfate tablet 325 mg  325 mg Oral BID WC Sanjuana Kava, NP      . hydrOXYzine (ATARAX/VISTARIL) tablet 50 mg  50 mg Oral QHS PRN,MR X 1 Mickie D. Adams, PA      . lamoTRIgine (LAMICTAL) tablet 100 mg  100 mg Oral Daily Mickie D. Adams, PA   100 mg at 06/20/12 0814  . magnesium hydroxide (MILK OF MAGNESIA) suspension 30 mL  30 mL Oral Daily PRN Mickie D. Adams, PA      . naproxen (NAPROSYN) tablet 500 mg  500 mg Oral BID PRN Mickie D. Adams, PA      . nicotine polacrilex (NICORETTE) gum 2 mg  2 mg Oral PRN Mickie D. Adams, PA   2 mg at 06/20/12 0814  . polyethylene glycol (MIRALAX / GLYCOLAX) packet 17 g  17 g Oral Daily Sanjuana Kava, NP       Facility-Administered Medications Ordered in Other Encounters  Medication Dose Route Frequency Provider Last Rate Last Dose  . DISCONTD: 0.9 %  sodium chloride infusion   Intravenous Continuous Ron Parker, MD 125 mL/hr at 06/19/12 1140    . DISCONTD: ciprofloxacin (CIPRO) IVPB 400 mg  400 mg Intravenous Q12H Celene Kras, MD   400 mg at 06/19/12 0739  . DISCONTD: ondansetron (ZOFRAN) injection 4 mg  4 mg Intravenous Q6H PRN Ron Parker, MD      . DISCONTD: ondansetron (ZOFRAN) tablet 4 mg  4 mg Oral Q6H PRN Ron Parker, MD      . DISCONTD: oxyCODONE (Oxy IR/ROXICODONE) immediate release tablet 5 mg  5 mg Oral Q4H PRN Ron Parker, MD      . DISCONTD: pantoprazole (PROTONIX) injection 40 mg  40 mg Intravenous Q12H Ron Parker, MD   40 mg at 06/19/12 1013    Lab Results:  Results for orders placed during the hospital encounter of 06/17/12 (from the past 48 hour(s))  VITAMIN B12     Status: Normal   Collection Time   06/18/12  7:59 PM      Component Value Range Comment   Vitamin B-12 656  211 - 911 pg/mL   FOLATE     Status: Normal   Collection Time   06/18/12  7:59 PM      Component Value Range Comment   Folate  7.9     IRON AND TIBC     Status: Abnormal   Collection Time   06/18/12  7:59 PM      Component Value Range Comment   Iron 11 (*) 42 - 135 ug/dL    TIBC 409 (*) 811 - 914 ug/dL    Saturation Ratios 6 (*) 20 - 55 %    UIBC 186  125 - 400 ug/dL   FERRITIN     Status: Normal   Collection Time   06/18/12  7:59 PM      Component Value Range Comment   Ferritin 34  10 - 291 ng/mL   RETICULOCYTES     Status: Abnormal   Collection Time   06/18/12  7:59 PM      Component Value Range Comment   Retic Ct Pct <0.4 (*) 0.4 - 3.1 % RESULTS CONFIRMED BY MANUAL DILUTION   RBC. 4.65  3.87 - 5.11 MIL/uL    Retic Count, Manual NOT CALCULATED  19.0 - 186.0 K/uL   CBC     Status: Abnormal   Collection Time   06/19/12  5:02 AM      Component Value Range Comment   WBC 5.9  4.0 - 10.5 K/uL WHITE COUNT CONFIRMED ON SMEAR   RBC 4.40  3.87 - 5.11 MIL/uL    Hemoglobin 8.1 (*) 12.0 - 15.0 g/dL    HCT 78.2 (*) 95.6 - 46.0 %    MCV 62.3 (*) 78.0 - 100.0 fL    MCH 18.4 (*) 26.0 - 34.0 pg    MCHC 29.6 (*) 30.0 - 36.0 g/dL    RDW 21.3 (*) 08.6 - 15.5 %    Platelets 307  150 - 400 K/uL   BASIC METABOLIC PANEL     Status: Abnormal   Collection Time   06/19/12  5:02 AM      Component Value Range Comment   Sodium 136  135 - 145 mEq/L    Potassium 3.7  3.5 - 5.1 mEq/L    Chloride 110  96 - 112 mEq/L    CO2 20  19 - 32 mEq/L    Glucose, Bld 86  70 - 99 mg/dL    BUN 3 (*) 6 - 23 mg/dL    Creatinine, Ser 5.78  0.50 - 1.10 mg/dL    Calcium 8.4  8.4 - 46.9 mg/dL    GFR calc non Af Amer >90  >90 mL/min    GFR calc  Af Amer >90  >90 mL/min     Physical Findings: AIMS: Facial and Oral Movements Muscles of Facial Expression: None, normal Lips and Perioral Area: None, normal Jaw: None, normal Tongue: None, normal,Extremity Movements Upper (arms, wrists, hands, fingers): None, normal Lower (legs, knees, ankles, toes): None, normal, Trunk Movements Neck, shoulders, hips: None, normal, Overall Severity Severity of  abnormal movements (highest score from questions above): None, normal Incapacitation due to abnormal movements: None, normal Patient's awareness of abnormal movements (rate only patient's report): No Awareness, Dental Status Current problems with teeth and/or dentures?: Yes (2 hole in tooth, top right and bottom right) Does patient usually wear dentures?: No  CIWA:    COWS:  COWS Total Score: 0   Treatment Plan Summary: Daily contact with patient to assess and evaluate symptoms and progress in treatment Medication management Mood/anxiety less than 3/10 where the scale is 1 is the best and 10 is the worst  Plan: Admit, start Lamictal for mood disorder and Elavil for appetite and for insomnia.  Discussed the risks, benefits, and probable clinical course with and without treatment.  Pt is agreeable to the current course of treatment.  Shauni Henner 06/20/2012, 2:54 PM

## 2012-06-20 NOTE — Progress Notes (Signed)
Pt attended discharge planning group and actively participated.  Pt presents with calm mood and affect.  Pt was open with sharing reason for entering the hospital.  Pt states that she has a lot going on right now in her life and got overwhelmed and tried to overdose.  Pt states that she was dealing with depression, anxiety and anger.  Pt states that she needed a break from the world and came here.  Pt states that she lives in [place] with her mother and has transportation home.  Pt states that she's been to [place] before but wants a new provider.  SW will assess for appropriate referrals.  No further needs voiced by pt at this time.    Reyes Ivan, LCSWA 06/20/2012  9:44 AM
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
## 2012-06-20 NOTE — Progress Notes (Signed)
D: Pt denies SI/HI/AV. Pt is pleasant and cooperative. Pt rates depression at a 1, anxiety at a 1, and Helplessness/hopelessness at a 1. Pt states she has a hard time communicating her feelings and feels she needs to build a better support system.   A: Pt was offered support and encouragement. Pt was given scheduled medications. Pt was encourage to attend groups. Pt was encouraged to come and speak with staff and reach out if she needed to.  Q 15 minute checks were done for safety.  R:Pt attends groups and interacts well with peers and staff. Pt is taking medication. Pt has no complaints. Pt receptive to treatment and safety maintained on unit.

## 2012-06-20 NOTE — Progress Notes (Signed)
Psychoeducational Group Note  Date:  06/20/2012 Time:  1100  Group Topic/Focus:  Building Self Esteem:   The Focus of this group is helping patients become aware of the effects of self-esteem on their lives, the things they and others do that enhance or undermine their self-esteem, seeing the relationship between their level of self-esteem and the choices they make and learning ways to enhance self-esteem.  Participation Level:  Active  Participation Quality:  Appropriate, Sharing and Supportive  Affect:  Appropriate  Cognitive:  Alert and Appropriate  Insight:  Good  Engagement in Group:  Good  Additional Comments:  Pt was very active during group and willing to participate in every activity that was provided by staff. Pt appeared excited about how many things that she was able to share with the group that she liked about herself. Pt also stated that she felt self-esteem was how you felt about yourself.   Sharyn Lull 06/20/2012, 12:41 PM

## 2012-06-21 DIAGNOSIS — F411 Generalized anxiety disorder: Secondary | ICD-10-CM

## 2012-06-21 DIAGNOSIS — F1220 Cannabis dependence, uncomplicated: Secondary | ICD-10-CM

## 2012-06-21 DIAGNOSIS — F3130 Bipolar disorder, current episode depressed, mild or moderate severity, unspecified: Principal | ICD-10-CM

## 2012-06-21 MED ORDER — VITAMIN D (ERGOCALCIFEROL) 1.25 MG (50000 UNIT) PO CAPS
50000.0000 [IU] | ORAL_CAPSULE | ORAL | Status: DC
  Filled 2012-06-21: qty 1

## 2012-06-21 MED ORDER — NICOTINE POLACRILEX 2 MG MT GUM: 2.0000 mg | CHEWING_GUM | OROMUCOSAL | Status: AC | PRN

## 2012-06-21 MED ORDER — CALCIUM CARBONATE-VITAMIN D 500-200 MG-UNIT PO TABS: 1.0000 | ORAL_TABLET | Freq: Two times a day (BID) | ORAL | Status: DC

## 2012-06-21 MED ORDER — VITAMIN D (ERGOCALCIFEROL) 1.25 MG (50000 UNIT) PO CAPS: 50000.0000 [IU] | ORAL_CAPSULE | ORAL | Status: DC

## 2012-06-21 MED ORDER — LAMOTRIGINE 100 MG PO TABS: 100.0000 mg | ORAL_TABLET | Freq: Every day | ORAL | Status: DC

## 2012-06-21 MED ORDER — FERROUS SULFATE 325 (65 FE) MG PO TABS: 325.0000 mg | ORAL_TABLET | Freq: Two times a day (BID) | ORAL | Status: DC

## 2012-06-21 MED ORDER — HYDROXYZINE HCL 50 MG PO TABS: 50.0000 mg | ORAL_TABLET | Freq: Every evening | ORAL | Status: AC | PRN

## 2012-06-21 MED ORDER — CALCIUM CARBONATE-VITAMIN D 500-200 MG-UNIT PO TABS
1.0000 | ORAL_TABLET | Freq: Two times a day (BID) | ORAL | Status: DC
  Administered 2012-06-22: 1 via ORAL
  Filled 2012-06-21 (×4): qty 28
  Filled 2012-06-21: qty 1

## 2012-06-21 MED ORDER — CIPROFLOXACIN HCL 500 MG PO TABS: 500.0000 mg | ORAL_TABLET | Freq: Two times a day (BID) | ORAL | Status: AC
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
## 2012-06-21 NOTE — Progress Notes (Signed)
Patient ID: [name] [name], female   DOB: [birth year], 28 y.o.   MRN: [id number] D: Pt. Lying in bed resting. A: monitor q11min for safety, R: no distress noted resp. Even. Pt. Is safe on unit.

## 2012-06-21 NOTE — Discharge Planning (Signed)
[name] [name] 06/21/2012  SW met with pt. In discharge planning group.  Pt. provided feedback on relapse prevention plan. Pt.  Would like to be linked to [facility] for follow-up.  SW made contact, but office appeared to be closed due to holiday.  Pt. Was able to express learning to develop social skills since being in the hospital and attending groups.  SW will continue to monitor and assess for progress.  Clarice Pole, LCASA 06/21/2012, 5:07 PM

## 2012-06-21 NOTE — Progress Notes (Signed)
D: Pt denies SI/HI/AV. Pt is pleasant and cooperative. Pt would like to go home tomorrow. Pt rates depression at 1 and Helplessness/hopelessness at a 1.  A: Pt was offered support and encouragement. Pt was given scheduled medications. Pt was encourage to attend groups. Q 15 minute checks were done for safety.  R:Pt attends groups and interacts well with peers and staff. Pt is taking medication. Pt has no complaints.Pt receptive to treatment and safety maintained on unit.

## 2012-06-21 NOTE — Tx Team (Signed)
Interdisciplinary Treatment Plan Update (Adult) Date: 06/21/2012  Time Reviewed: 9:58 AM  Progress in Treatment: Attending groups: Yes Participating in groups: Yes Taking medication as prescribed: Yes Tolerating medication: Yes Family/Significant other contact made:  Patient understands diagnosis: Yes Discussing patient identified problems/goals with staff: Yes Medical problems stabilized or resolved: Yes Denies suicidal/homicidal ideation: Yes Issues/concerns per patient self-inventory: None identified Other: N/A New problem(s) identified: None Identified Reason for Continuation of Hospitalization: Anxiety Depression Medication stabilization Suicidal ideation Interventions implemented related to continuation of hospitalization: mood stabilization, medication monitoring and adjustment, group therapy and psycho education, safety checks q 15 mins Additional comments: N/A Estimated length of stay: 2-4 days  Discharge Plan: New goal(s): N/A Review of initial/current patient goals per problem list:  1. Goal(s): Reduce depressive/anxiety symptoms  Met: No  Target date: by discharge  As evidenced by: Reducing depression from a 10 to a 3 as reported by pt.  2. Goal (s): Reduce/Eliminate suicidal ideation  Met: No  Target date: by discharge  As evidenced by: pt reporting no SI.  3. Goal(s): Address anger/communication skill building  Met: Yes  Target date: by discharge  As evidenced by: pt. expressed she has increased her social skills and is able to asking for help. 3. Goal(s): medication stabilization  Met: No  Target date: by discharge  As evidenced [id number] and adjusting medications. Attendees: Patient: [name] [name] 06/21/2012  Family:    Physician: Orson Aloe, MD 06/21/2012 9:58 AM   Nursing: Kae Heller, RN 06/21/2012  Case Manager: Clarice Pole, LCASA 06/21/2012 9:58 AM  Counselor: Angus Palms, LCSW 06/21/2012 9:58 AM   Other:  06/21/2012 9:58 AM    Other:    Other:    Other:    Scribe for Treatment Team:  Clarice Pole, LCASA 06/21/2012 9:58 AM
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
## 2012-06-21 NOTE — Progress Notes (Addendum)
[facility] MD Progress Note  06/21/2012 5:16 PM  Diagnosis:   Axis I: Bipolar, Depressed, Generalized Anxiety Disorder and Cannabis and Nicotine Dependence Axis II: Deferred Axis III:  Past Medical History  Diagnosis Date  . Anemia   . Abscess of axilla   . Depression    Axis IV: occupational problems, other psychosocial or environmental problems and problems related to social environment Axis V: 41-50 serious symptoms  ADL's:  Intact  Sleep: Good, with the use of Vistaril  Appetite:  Good  Suicidal Ideation:  Pt denies any thoughts, plans, intent of suicide Homicidal Ideation:  Pt denies any thoughts, plans, intent of homicide  Mental Status Examination/Evaluation: Objective:  Appearance: Casual  Eye Contact::  Good  Speech:  Clear and Coherent  Volume:  Normal  Mood:  Anxious and Dysphoric  Affect:  Congruent  Thought Process:  Coherent  Orientation:  Full  Thought Content:  WDL  Suicidal Thoughts:  No  Homicidal Thoughts:  No  Memory:  Immediate;   Good Recent;   Good Remote;   Good  Judgement:  Fair  Insight:  Good  Psychomotor Activity:  Normal  Concentration:  Good  Recall:  Good  Akathisia:  No  Handed:  Right  AIMS (if indicated):     Assets:  Communication Skills Desire for Improvement  Sleep:  Number of Hours: 6.5    ROS: Neuro: no headaches, ataxia, weakness, but some dizziness upon first awakening in the morning.  GI: no N/V/D/cramps/constipation  MS: no weakness, muscle cramps, aches.  Vital Signs:Blood pressure 96/56, pulse 60, temperature 98.2 F (36.8 C), temperature source Oral, resp. rate 16, last menstrual period 06/17/2012. Current Medications: Current Facility-Administered Medications  Medication Dose Route Frequency Provider Last Rate Last Dose  . acetaminophen (TYLENOL) tablet 650 mg  650 mg Oral Q6H PRN Mickie D. Adams, PA      . alum & mag hydroxide-simeth (MAALOX/MYLANTA) 200-200-20 MG/5ML suspension 30 mL  30 mL Oral Q4H PRN Mickie  D. Adams, PA      . benzocaine (ORAJEL) 10 % mucosal gel 1 application  1 application Mouth/Throat PRN Mickie D. Adams, PA      . ciprofloxacin (CIPRO) tablet 500 mg  500 mg Oral BID Mickie D. Adams, PA   500 mg at 06/21/12 1650  . ferrous sulfate tablet 325 mg  325 mg Oral BID WC Sanjuana Kava, NP   325 mg at 06/21/12 1650  . hydrOXYzine (ATARAX/VISTARIL) tablet 50 mg  50 mg Oral QHS PRN,MR X 1 Mickie D. Adams, PA   50 mg at 06/20/12 2228  . lamoTRIgine (LAMICTAL) tablet 100 mg  100 mg Oral Daily Mickie D. Adams, PA   100 mg at 06/21/12 0807  . magnesium hydroxide (MILK OF MAGNESIA) suspension 30 mL  30 mL Oral Daily PRN Mickie D. Adams, PA      . naproxen (NAPROSYN) tablet 500 mg  500 mg Oral BID PRN Mickie D. Adams, PA      . nicotine polacrilex (NICORETTE) gum 2 mg  2 mg Oral PRN Mickie D. Adams, PA   2 mg at 06/21/12 1649  . polyethylene glycol (MIRALAX / GLYCOLAX) packet 17 g  17 g Oral Daily Sanjuana Kava, NP        Lab Results:  Results for orders placed during the hospital encounter of 06/19/12 (from the past 48 hour(s))  VITAMIN D 25 HYDROXY     Status: Abnormal   Collection Time   06/21/12  6:20  AM      Component Value Range Comment   Vit D, 25-Hydroxy 16 (*) 30 - 89 ng/mL     Physical Findings: AIMS: Facial and Oral Movements Muscles of Facial Expression: None, normal Lips and Perioral Area: None, normal Jaw: None, normal Tongue: None, normal,Extremity Movements Upper (arms, wrists, hands, fingers): None, normal Lower (legs, knees, ankles, toes): None, normal, Trunk Movements Neck, shoulders, hips: None, normal, Overall Severity Severity of abnormal movements (highest score from questions above): None, normal Incapacitation due to abnormal movements: None, normal Patient's awareness of abnormal movements (rate only patient's report): No Awareness, Dental Status Current problems with teeth and/or dentures?: Yes (2 hole in tooth, top right and bottom right) Does patient  usually wear dentures?: No  CIWA:    COWS:  COWS Total Score: 0   Treatment Plan Summary: Daily contact with patient to assess and evaluate symptoms and progress in treatment Medication management Mood/anxiety less than 3/10 where the scale is 1 is the best and 10 is the worst  Plan: Pt has improved significantly form admission.  She reports learning considerable things about how to ne more positive.  She is most relieved that she is now cigarette free.  She has used nicorette gum with great success.  She seems to have met her inpatient goals and is ready for D/C.  Will D/C in the AM so she can see her children before they leave for several weeks.  Yamil Oelke 06/21/2012, 5:16 PM

## 2012-06-21 NOTE — Progress Notes (Signed)
[facility] Group Notes: (Counselor/Nursing/MHT/Case Management/Adjunct) 06/21/2012   @1 :15 -2:30pm Preventing Relapse  Type of Therapy:  Group Therapy  Participation Level:  Active  Participation Quality: Appropriate, Sharing, Supportive    Affect:  Appropriate  Cognitive:  Appropriate  Insight:  Good  Engagement in Group: good  Engagement in Therapy:  Good  Modes of Intervention:  Support and Exploration  Summary of Progress/Problems: [name] [name] was very engaged and explored how frustration and guilt are emotions that often lead her to relapse. She shared examples of times when she allows herself to be overcome by these emotions, and even rationalizes to herself to make her feel better about the relapse. [name] [name] processed how she seems to get  into cycles, and makes decisions to keep her stuck in those cycle because they are more comfortable than doing what is needed to get herself out. She stated that she believes she now has the strength and confidence to set the boundaries she needs to in order to get herself into healthier relationships. [name] [name] also shared that she has become more aware, and sees this awareness as the key to relapse prevention for her. She explored ways that she can fight against relapse if only she is aware that is what she is headed for. She also recognized that this is going to take being intentional with her decisions and honest with herself and others.   Angus Palms, LCSW 06/21/2012 4:11 PM

## 2012-06-22 NOTE — Progress Notes (Signed)
[facility] Group Notes:  (Counselor/Nursing/MHT/Case Management/Adjunct)  06/22/2012 1:09 AM  Type of Therapy:  Psychoeducational Skills  Participation Level:  Active  Participation Quality:  Appropriate  Affect:  Appropriate  Cognitive:  Appropriate  Insight:  Good  Engagement in Group:  Good  Engagement in Therapy:  Good  Modes of Intervention:  Education  Summary of Progress/Problems:Pt. States that she had a good day overall and this is a result of going outside for fresh air and because she spoke with her peers.  Her goal is to continue improve her mood by attending groups and socializing with other patients.    Juleen Sorrels S 06/22/2012, 1:09 AM

## 2012-06-22 NOTE — Progress Notes (Signed)
Patient ID: [name] [name], female   DOB: Jun 09, 1986, 26 y.o.   MRN: [id number] Patient was calm and cooperative during d/c process. All f/u information explained and medication information given with samples. Patient verbalized understanding. Denies SI/HI. States all treatment goals were met. All belongings returned and escorted to lobby to care of family.

## 2012-06-22 NOTE — Progress Notes (Signed)
[facility] Case Management Discharge Plan:  Will you be returning to the same living situation after discharge: Yes,   At discharge, do you have transportation home?:Yes, Pt.'s mother [name] [phone] Do you have the ability to pay for your medications:Yes,    Interagency Information:     Release of information consent forms completed and in the chart;  Patient's signature needed at discharge.  Patient to Follow up at:  Follow-up Information    Follow up with [facility]  on 06/24/2012. (Pt. to visit walk-in clinic at [facility] M-F 8-3pm ASAP.)    Contact information:   [address]  [place], [place] [phone] fax [phone]         Patient denies SI/HI:   Yes,      Safety Planning and Suicide Prevention discussed:  Yes,    Barrier to discharge identified:No.  Summary and Recommendations: Pt. Will follow up with appointments and was given [facility] SI Prevention Pamphlet and agreed to use them if needed.   [name] [name] 06/22/2012, 11:17 AM
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
## 2012-06-22 NOTE — Progress Notes (Signed)
Psychoeducational Group Note  Date:  06-22-2012   Group Topic/Focus:  Identifying Needs:   The focus of this group is to help patients identify their personal needs that have been historically problematic and identify healthy behaviors to address their needs.  Participation Level:  Active  Participation Quality:  Appropriate  Affect:  Appropriate  Cognitive:  Alert  Insight:  Good  Engagement in Group:  Good  Additional Comments: Excellent topic discussion.  Cresenciano Lick 06/22/2012, 11:04 AM

## 2012-06-22 NOTE — Progress Notes (Signed)
Patient attended group and requested a prn of nicotine gum which was given. Patient reports that her goal is to find an support group and be around positive friends. Patient reports that she has already contacted some friends since being here and has let them know that she will remain their friends but from a distance. Support and encouragement offered. Denies si/hi/a/v hallucinations. Safety maintained on unit, will continue to monitor.

## 2012-06-25 NOTE — Progress Notes (Signed)
Patient Discharge Instructions:  After Visit Summary (AVS):   Faxed to:  06/25/2012 Psychiatric Admission Assessment Note:   Faxed to:  06/25/2012 Suicide Risk Assessment - Discharge Assessment:   Faxed to:  06/25/2012 Faxed/Sent to the Next Level Care provider:  06/25/2012  Faxed to [facility] @ [phone]  Heloise Purpura Eduard Clos, 06/25/2012, 6:29 PM

## 2012-10-21 ENCOUNTER — Encounter (HOSPITAL_COMMUNITY): Payer: Self-pay | Admitting: Emergency Medicine

## 2012-10-21 ENCOUNTER — Emergency Department (HOSPITAL_COMMUNITY)
Admission: EM | Admit: 2012-10-21 | Discharge: 2012-10-21 | Disposition: A | Discharge status: Home / Self Care | Payer: Self-pay | Attending: Emergency Medicine | Admitting: Emergency Medicine

## 2012-10-21 DIAGNOSIS — D649 Anemia, unspecified: Secondary | ICD-10-CM | POA: Insufficient documentation

## 2012-10-21 DIAGNOSIS — G43909 Migraine, unspecified, not intractable, without status migrainosus: Secondary | ICD-10-CM

## 2012-10-21 DIAGNOSIS — F3289 Other specified depressive episodes: Secondary | ICD-10-CM | POA: Insufficient documentation

## 2012-10-21 DIAGNOSIS — F329 Major depressive disorder, single episode, unspecified: Secondary | ICD-10-CM

## 2012-10-21 DIAGNOSIS — G43109 Migraine with aura, not intractable, without status migrainosus: Secondary | ICD-10-CM | POA: Insufficient documentation

## 2012-10-21 DIAGNOSIS — F17200 Nicotine dependence, unspecified, uncomplicated: Secondary | ICD-10-CM | POA: Insufficient documentation

## 2012-10-21 DIAGNOSIS — R071 Chest pain on breathing: Secondary | ICD-10-CM | POA: Insufficient documentation

## 2012-10-21 DIAGNOSIS — R0789 Other chest pain: Secondary | ICD-10-CM

## 2012-10-21 DIAGNOSIS — Z79899 Other long term (current) drug therapy: Secondary | ICD-10-CM | POA: Insufficient documentation

## 2012-10-21 DIAGNOSIS — F32A Depression, unspecified: Secondary | ICD-10-CM

## 2012-10-21 DIAGNOSIS — R209 Unspecified disturbances of skin sensation: Secondary | ICD-10-CM | POA: Insufficient documentation

## 2012-10-21 MED ORDER — PROCHLORPERAZINE MALEATE 10 MG PO TABS: 10.0000 mg | ORAL_TABLET | Freq: Two times a day (BID) | ORAL | Status: DC | PRN

## 2012-10-21 MED ORDER — KETOROLAC TROMETHAMINE 30 MG/ML IJ SOLN
30.0000 mg | Freq: Once | INTRAMUSCULAR | Status: AC
  Administered 2012-10-21: 30 mg via INTRAMUSCULAR
  Filled 2012-10-21: qty 1

## 2012-10-21 MED ORDER — DIPHENHYDRAMINE HCL 25 MG PO CAPS: 25.0000 mg | ORAL_CAPSULE | Freq: Four times a day (QID) | ORAL | Status: DC | PRN

## 2012-10-21 MED ORDER — PROCHLORPERAZINE MALEATE 10 MG PO TABS
10.0000 mg | ORAL_TABLET | Freq: Once | ORAL | Status: AC
  Administered 2012-10-21: 10 mg via ORAL
  Filled 2012-10-21: qty 1

## 2012-10-21 MED ORDER — DIPHENHYDRAMINE HCL 25 MG PO CAPS
25.0000 mg | ORAL_CAPSULE | Freq: Once | ORAL | Status: AC
  Administered 2012-10-21: 25 mg via ORAL
  Filled 2012-10-21: qty 1

## 2012-10-21 NOTE — ED Provider Notes (Signed)
History     CSN: [id number]  Arrival date & time 10/21/12  1310   First MD Initiated Contact with Patient 10/21/12 1506      Chief Complaint  Patient presents with  . Chest Pain  . Numbness    (Consider location/radiation/quality/duration/timing/severity/associated sxs/prior treatment) HPIApril C [name] is a 26 y.o. female presenting with pleuritic CP that started Saturday, has been constant since then, is 7/10, with sharp pangs on inhalation.  She denies cough, nausea but has been having dry heaves.  Also c/o HA 9/10 over right side of head, throbbing, tingling of right arm.  H/O migraine. She is a smoker.  H/O Anxiety and depression.  She also says she's had a stressful week with "everything going on" and "personal emotionals."  Denies exogenous estrogens, no h/o DVT or PE. No hemoptysis.     Past Medical History  Diagnosis Date  . Anemia   . Abscess of axilla   . Depression     History reviewed. No pertinent past surgical history.  Family History  Problem Relation Age of Onset  . Hypertension Mother   . Diabetes type II Mother     History  Substance Use Topics  . Smoking status: Current Every Day Smoker -- 0.5 packs/day for 9 years    Types: Cigarettes  . Smokeless tobacco: Not on file  . Alcohol Use: 0.0 oz/week    OB History    Grav Para Term Preterm Abortions TAB SAB Ect Mult Living                  Review of Systems At least 10pt or greater review of systems completed and are negative except where specified in the HPI.  Allergies  Amoxicillin and Penicillins  Home Medications   Current Outpatient Rx  Name  Route  Sig  Dispense  Refill  . BENZOCAINE 10 % MT GEL   Mouth/Throat   Use as directed 1 application in the mouth or throat as needed. Tooth pain         . CALCIUM CARBONATE-VITAMIN D 500-200 MG-UNIT PO TABS   Oral   Take 1 tablet by mouth 2 (two) times daily. For Vitamin D replacement and mood control.   60 tablet   0   . FERROUS  SULFATE 325 (65 FE) MG PO TABS   Oral   Take 1 tablet (325 mg total) by mouth 2 (two) times daily with a meal. For anemia.   60 tablet   0   . LAMOTRIGINE 100 MG PO TABS   Oral   Take 1 tablet (100 mg total) by mouth daily. For mood control.   30 tablet   0   . NAPROXEN 500 MG PO TABS   Oral   Take 500 mg by mouth 2 (two) times daily as needed. Pain         . VITAMIN D (ERGOCALCIFEROL) 50000 UNITS PO CAPS   Oral   Take 1 capsule (50,000 Units total) by mouth every 7 (seven) days. For Vitamin D replacement and mood control.   4 capsule   1     BP 108/56  Pulse 58  Temp 98.4 F (36.9 C) (Oral)  Resp 13  SpO2 100%  Physical Exam  Nursing notes reviewed.  Electronic medical record reviewed. VITAL SIGNS:   Filed Vitals:   10/21/12 1403  BP: 108/56  Pulse: 58  Temp: 98.4 F (36.9 C)  TempSrc: Oral  Resp: 13  SpO2: 100%   CONSTITUTIONAL:  Awake, oriented, appears non-toxic HENT: Atraumatic, normocephalic, oral mucosa pink and moist, airway patent. Nares patent without drainage. External ears normal. EYES: Conjunctiva clear, EOMI, PERRLA NECK: Trachea midline, non-tender, supple CARDIOVASCULAR: Normal heart rate, Normal rhythm, No murmurs, rubs, gallops PULMONARY/CHEST: Clear to auscultation, no rhonchi, wheezes, or rales. Symmetrical breath sounds. Non-tender. ABDOMINAL: Non-distended, soft, non-tender - no rebound or guarding.  BS normal. NEUROLOGIC: Facial sensation equal to light touch bilaterally.  Good muscle bulk in the masseter muscle and good lateral movement of the jaw.  Facial expressions equal and good strength with smile/frown and puffed cheeks.  Hearing grossly intact to finger rub test.  Uvula, tongue are midline with no deviation. Symmetrical palate elevation.  Trapezius and SCM muscles are 5/5 strength bilaterally.   DTR: Brachioradialis, biceps, patellar, Achilles tendon reflexes 2+ bilaterally.  No clonus. Strength: 5/5 strength flexors and extensors  in the upper and lower extremities.  Grip strength, finger adduction/abduction 5/5. Sensation: Sensation intact distally to light touch Cerebellar: No ataxia with walking or dysmetria with finger to nose, rapid alternating hand movements and heels to shin testing. EXTREMITIES: No clubbing, cyanosis, or edema SKIN: Warm, Dry, No erythema, No rash  ED Course  Procedures (including critical care time)  Labs Reviewed - No data to display No results found.   1. Migraine syndrome   2. Chest wall pain   3. Depression       Medications  prochlorperazine (COMPAZINE) 10 MG tablet (not administered)  diphenhydrAMINE (BENADRYL) 25 mg capsule (not administered)  ketorolac (TORADOL) 30 MG/ML injection 30 mg (30 mg Intramuscular Given 10/21/12 1607)  prochlorperazine (COMPAZINE) tablet 10 mg (10 mg Oral Given 10/21/12 1712)  diphenhydrAMINE (BENADRYL) capsule 25 mg (25 mg Oral Given 10/21/12 1607)    MDM  [name] [name] is a 26 y.o. female presenting with migraine headache and likely chest wall pain.  No personal h/o DVT, low risk for ACS.  Pt appears in NAD, non-toxic, and has stable VS within normal limitis. EKG is unremarkable. PERC negative. The emergent differential diagnosis of chest pain includes: Acute coronary syndrome, pericarditis, aortic dissection, pulmonary embolism, tension pneumothorax, pneumonia, and esophageal rupture. I do not think the patient has any emergent intrathoracic condition at this time.  Will treat migraine and reassess.  HA not concerning for [facility] or CNS infection.  Paresthesias seem related to Migraine.  Do not think further testing is required at this time.    PT feeling better after medications.  Compazine prescription given.  Instructed to take with benadryl to avoid akasthesia/dystonia.  Risks and benefits of medicines discussed and understood.  I explained the diagnosis and have given explicit precautions to return to the ER including any other new or worsening  symptoms. The patient understands and accepts the medical plan as it's been dictated and I have answered their questions. Discharge instructions concerning home care and prescriptions have been given.  The patient is STABLE and is discharged to home in good condition.            Jones Skene, MD 10/24/12 1811

## 2012-10-21 NOTE — ED Notes (Signed)
Consulted with case management, prescriptions sent down to pharmacy.

## 2012-10-21 NOTE — Progress Notes (Signed)
[facility] ED CM spoke with pt and female at bedside Cm inquired about pt medications She reports she has not had medications since d/c from [facility] in 07/13 Reports not being able to stay at any residence more than 1-2 months there fore unable to place information on medicaid application Had an apartment and lost it in 11/2011.  Stayed with her mother for short periods of time Reports her children have medicaid but she does not.   Pt reports medications as iron, nicorette, xanax and abilify. ED RN tubed rx for ordered compazine and benadryl.  Pending processing from [facility] pharmacy.  CM reviewed d/c summary from 7/13 [facility] stay CM provided pt needymeds.com pt assistance program for lamictal and bridges to access CM reviewed how to obtained a self pay pcp, use needymeds.com, access [facility], DSS case worker for [employer], copy of [employer] & [employer] $4 list medications including her naproxen, prochlorperazine Encouraged pt to re attempt medicaid application, to find a self pay pcp and to establish residency

## 2012-10-21 NOTE — Progress Notes (Signed)
[facility] ED Cm consulted by ED [name], Burna Mortimer for EDP, [place], to assist with medication resources for pt. CM confirmed with [facility] pharmacy that pt is eligible for indigent medication assistance.  CM updated EDP, [place] and ED RN Pending Rx for processing

## 2012-12-18 DIAGNOSIS — K602 Anal fissure, unspecified: Secondary | ICD-10-CM

## 2012-12-18 HISTORY — DX: Anal fissure, unspecified: K60.2

## 2013-02-05 ENCOUNTER — Emergency Department (HOSPITAL_COMMUNITY)
Admission: EM | Admit: 2013-02-05 | Discharge: 2013-02-05 | Disposition: A | Discharge status: Home / Self Care | Payer: Medicaid Other | Attending: Emergency Medicine | Admitting: Emergency Medicine

## 2013-02-05 ENCOUNTER — Encounter (HOSPITAL_COMMUNITY): Payer: Self-pay | Admitting: Emergency Medicine

## 2013-02-05 DIAGNOSIS — Z862 Personal history of diseases of the blood and blood-forming organs and certain disorders involving the immune mechanism: Secondary | ICD-10-CM | POA: Insufficient documentation

## 2013-02-05 DIAGNOSIS — Z872 Personal history of diseases of the skin and subcutaneous tissue: Secondary | ICD-10-CM | POA: Insufficient documentation

## 2013-02-05 DIAGNOSIS — F17200 Nicotine dependence, unspecified, uncomplicated: Secondary | ICD-10-CM | POA: Insufficient documentation

## 2013-02-05 DIAGNOSIS — R220 Localized swelling, mass and lump, head: Secondary | ICD-10-CM | POA: Insufficient documentation

## 2013-02-05 DIAGNOSIS — H00019 Hordeolum externum unspecified eye, unspecified eyelid: Secondary | ICD-10-CM

## 2013-02-05 DIAGNOSIS — Z8659 Personal history of other mental and behavioral disorders: Secondary | ICD-10-CM | POA: Insufficient documentation

## 2013-02-05 DIAGNOSIS — R221 Localized swelling, mass and lump, neck: Secondary | ICD-10-CM | POA: Insufficient documentation

## 2013-02-05 MED ORDER — CEPHALEXIN 500 MG PO CAPS: 500.0000 mg | ORAL_CAPSULE | Freq: Four times a day (QID) | ORAL | Status: DC

## 2013-02-05 MED ORDER — IBUPROFEN 800 MG PO TABS: 800.0000 mg | ORAL_TABLET | Freq: Three times a day (TID) | ORAL | Status: DC | PRN

## 2013-02-05 NOTE — ED Notes (Signed)
Swelling noted on l/eye lid. Pt reports extensions 4 weeks ago, increased pain and swelling over last two weeks

## 2013-02-05 NOTE — ED Provider Notes (Signed)
History     CSN: [id number]  Arrival date & time 02/05/13  1025   First MD Initiated Contact with Patient 02/05/13 1050      Chief Complaint  Patient presents with  . Facial Swelling    l/eye lisd swollen and red. Pt reports difficulty with eyelash extentions  . Eye Pain    (Consider location/radiation/quality/duration/timing/severity/associated sxs/prior treatment) Patient is a 27 y.o. female presenting with eye pain.  Eye Pain   Patient is a 27 yo female who presents today with left sided eye pain.  At the end of January she got fake eyelashes done that she has gotten done before.  About two weeks she noticed that her left upper eyelid was swelling up and this would come and go after using hot compresses.  Then a week ago her eyelid started to become painful.  She removed the eyelashes and noticed a white bump which she popped.  Then she noticed two more bumps.  Denies fever or any changes in vision.    Past Medical History  Diagnosis Date  . Anemia   . Abscess of axilla   . Depression     History reviewed. No pertinent past surgical history.  Family History  Problem Relation Age of Onset  . Hypertension Mother   . Diabetes type II Mother     History  Substance Use Topics  . Smoking status: Current Every Day Smoker -- 0.50 packs/day for 9 years    Types: Cigarettes  . Smokeless tobacco: Not on file  . Alcohol Use: 0.0 oz/week    OB History   Grav Para Term Preterm Abortions TAB SAB Ect Mult Living                  Review of Systems  Eyes: Positive for pain.   All other systems negative except as documented in the HPI. All pertinent positives and negatives as reviewed in the HPI.  Allergies  Amoxicillin and Penicillins  Home Medications  No current outpatient prescriptions on file.  BP 108/57  Pulse 58  Temp(Src) 99 F (37.2 C)  SpO2 100%  LMP 01/08/2013  Physical Exam  Constitutional: She is oriented to person, place, and time. She appears  well-developed and well-nourished. No distress.  HENT:  Head: Normocephalic and atraumatic.  Right Ear: External ear normal.  Left Ear: External ear normal.  Eyes: Conjunctivae and EOM are normal. Pupils are equal, round, and reactive to light. Left eye exhibits hordeolum. Left eye exhibits no discharge and no exudate. No foreign body present in the left eye.    Cardiovascular: Normal rate, regular rhythm and normal heart sounds.   Pulmonary/Chest: Effort normal.  Neurological: She is alert and oriented to person, place, and time.  Skin: No rash noted. She is not diaphoretic. No erythema. No pallor.  Psychiatric: She has a normal mood and affect. Her behavior is normal. Judgment and thought content normal.    ED Course  Procedures (including critical care time)  The patient is referred to opthamology. Told to return here as needed. The patient has a stye. Told to use warm compresses as well.      MDM          Carlyle Dolly, PA-C 02/08/13 1844

## 2013-02-09 NOTE — ED Provider Notes (Signed)
Medical screening examination/treatment/procedure(s) were performed by non-physician practitioner and as supervising physician I was immediately available for consultation/collaboration.  Keyoni Lapinski, MD 02/09/13 0737 

## 2013-03-16 ENCOUNTER — Encounter (HOSPITAL_COMMUNITY): Payer: Self-pay | Admitting: Emergency Medicine

## 2013-03-16 ENCOUNTER — Emergency Department (HOSPITAL_COMMUNITY)
Admission: EM | Admit: 2013-03-16 | Discharge: 2013-03-16 | Disposition: A | Discharge status: Home / Self Care | Payer: Medicaid Other | Attending: Emergency Medicine | Admitting: Emergency Medicine

## 2013-03-16 DIAGNOSIS — Z862 Personal history of diseases of the blood and blood-forming organs and certain disorders involving the immune mechanism: Secondary | ICD-10-CM | POA: Insufficient documentation

## 2013-03-16 DIAGNOSIS — Z8659 Personal history of other mental and behavioral disorders: Secondary | ICD-10-CM | POA: Insufficient documentation

## 2013-03-16 DIAGNOSIS — IMO0002 Reserved for concepts with insufficient information to code with codable children: Secondary | ICD-10-CM | POA: Insufficient documentation

## 2013-03-16 DIAGNOSIS — F17200 Nicotine dependence, unspecified, uncomplicated: Secondary | ICD-10-CM | POA: Insufficient documentation

## 2013-03-16 DIAGNOSIS — L02412 Cutaneous abscess of left axilla: Secondary | ICD-10-CM

## 2013-03-16 MED ORDER — HYDROCODONE-ACETAMINOPHEN 5-325 MG PO TABS: 2.0000 | ORAL_TABLET | Freq: Three times a day (TID) | ORAL | Status: DC | PRN

## 2013-03-16 NOTE — ED Provider Notes (Signed)
History    This chart was scribed for [name] Pel, PA, non-physician practitioner working with Derwood Kaplan, MD by Charolett Bumpers, ED Scribe. This patient was seen in room WTR6/WTR6 and the patient's care was started at 3:55 PM.    CSN: [id number]  Arrival date & time 03/16/13  1413   First MD Initiated Contact with Patient 03/16/13 1555      Chief Complaint  Patient presents with  . Abscess    The history is provided by the patient. No language interpreter was used.  [name] [name] is a 27 y.o. female who presents to the Emergency Department complaining of 2 weeks of gradually worsening abscess to her left armpit. She reports associated pain and the area is tender to touch. She has a h/o abscesses in different locations. She reports the abscess has not been draining and has increased in size. She denies any fevers.   Past Medical History  Diagnosis Date  . Anemia   . Abscess of axilla   . Depression     No past surgical history on file.  Family History  Problem Relation Age of Onset  . Hypertension Mother   . Diabetes type II Mother     History  Substance Use Topics  . Smoking status: Current Every Day Smoker -- 0.50 packs/day for 9 years    Types: Cigarettes  . Smokeless tobacco: Not on file  . Alcohol Use: 0.0 oz/week    OB History   Grav Para Term Preterm Abortions TAB SAB Ect Mult Living                  Review of Systems  Constitutional: Negative for fever.  Skin: Positive for wound.       Abscess to left axilla  All other systems reviewed and are negative.    Allergies  Amoxicillin and Penicillins  Home Medications   Current Outpatient Rx  Name  Route  Sig  Dispense  Refill  . HYDROcodone-acetaminophen (NORCO/VICODIN) 5-325 MG per tablet   Oral   Take 2 tablets by mouth every 8 (eight) hours as needed for pain.   6 tablet   0     BP 100/54  Pulse 63  Temp(Src) 98.6 F (37 C) (Oral)  Resp 16  SpO2 100%  Physical Exam   Nursing note and vitals reviewed. Constitutional: She is oriented to person, place, and time. She appears well-developed and well-nourished. No distress.  HENT:  Head: Normocephalic and atraumatic.  Eyes: EOM are normal.  Neck: Neck supple. No tracheal deviation present.  Cardiovascular: Normal rate.   Pulmonary/Chest: Effort normal. No respiratory distress.  Musculoskeletal: Normal range of motion.  Neurological: She is alert and oriented to person, place, and time.  Skin: Skin is warm and dry.     Psychiatric: She has a normal mood and affect. Her behavior is normal.    ED Course  Procedures (including critical care time)  INCISION AND DRAINAGE Performed by: [name] Pel, PA Consent: Verbal consent obtained. Risks and benefits: risks, benefits and alternatives were discussed Type: abscess Body area: left axilla Anesthesia: local infiltration Incision was made with a scalpel. Local anesthetic: lidocaine 1% with epinephrine Anesthetic total: 1 cc Complexity: complex Blunt dissection to break up loculations Drainage: purulent Drainage amount: moderate Patient tolerance: Patient tolerated the procedure well with no immediate complications.   DIAGNOSTIC STUDIES: Oxygen Saturation is 100% on RA, normal by my interpretation.    COORDINATION OF CARE:  4:05 PM-Discussed planned course  of treatment with the patient including I&D of abscess in left axilla, who is agreeable at this time.    Labs Reviewed - No data to display No results found.   1. Abscess of axilla, left       MDM  Pt has been advised of the symptoms that warrant their return to the ED. Patient has voiced understanding and has agreed to follow-up with the PCP or specialist.  I personally performed the services described in this documentation, which was scribed in my presence. The recorded information has been reviewed and is accurate.    Dorthula Matas, PA-C 03/16/13 1645

## 2013-03-16 NOTE — ED Notes (Signed)
Pt states she has an abscess under her left arm x 2 wks.  States it is not coming to a head and is painful.

## 2013-03-16 NOTE — ED Provider Notes (Signed)
Medical screening examination/treatment/procedure(s) were performed by non-physician practitioner and as supervising physician I was immediately available for consultation/collaboration.  Derwood Kaplan, MD 03/16/13 [phone]

## 2013-05-28 ENCOUNTER — Encounter (HOSPITAL_COMMUNITY): Payer: Self-pay | Admitting: Emergency Medicine

## 2013-05-28 ENCOUNTER — Emergency Department (HOSPITAL_COMMUNITY)
Admission: EM | Admit: 2013-05-28 | Discharge: 2013-05-28 | Disposition: A | Discharge status: Home / Self Care | Payer: Medicaid Other | Attending: Emergency Medicine | Admitting: Emergency Medicine

## 2013-05-28 DIAGNOSIS — K649 Unspecified hemorrhoids: Secondary | ICD-10-CM | POA: Insufficient documentation

## 2013-05-28 DIAGNOSIS — Z862 Personal history of diseases of the blood and blood-forming organs and certain disorders involving the immune mechanism: Secondary | ICD-10-CM | POA: Insufficient documentation

## 2013-05-28 DIAGNOSIS — K648 Other hemorrhoids: Secondary | ICD-10-CM

## 2013-05-28 DIAGNOSIS — Z8659 Personal history of other mental and behavioral disorders: Secondary | ICD-10-CM | POA: Insufficient documentation

## 2013-05-28 DIAGNOSIS — Z880 Allergy status to penicillin: Secondary | ICD-10-CM | POA: Insufficient documentation

## 2013-05-28 DIAGNOSIS — F17200 Nicotine dependence, unspecified, uncomplicated: Secondary | ICD-10-CM | POA: Insufficient documentation

## 2013-05-28 DIAGNOSIS — Z872 Personal history of diseases of the skin and subcutaneous tissue: Secondary | ICD-10-CM | POA: Insufficient documentation

## 2013-05-28 MED ORDER — PERCOCET 5-325 MG PO TABS: 1.0000 | ORAL_TABLET | Freq: Four times a day (QID) | ORAL | Status: DC | PRN

## 2013-05-28 MED ORDER — LIDOCAINE HCL 2 % EX GEL
Freq: Once | CUTANEOUS | Status: AC
  Administered 2013-05-28: 10 via TOPICAL
  Filled 2013-05-28: qty 10

## 2013-05-28 MED ORDER — OXYCODONE-ACETAMINOPHEN 5-325 MG PO TABS
1.0000 | ORAL_TABLET | Freq: Once | ORAL | Status: AC
  Administered 2013-05-28: 1 via ORAL
  Filled 2013-05-28: qty 1

## 2013-05-28 MED ORDER — POLYETHYLENE GLYCOL 3350 17 G PO PACK: 17.0000 g | PACK | Freq: Every day | ORAL | Status: DC

## 2013-05-28 MED ORDER — HYDROCORTISONE ACETATE 25 MG RE SUPP: 25.0000 mg | Freq: Two times a day (BID) | RECTAL | Status: DC

## 2013-05-28 NOTE — ED Provider Notes (Signed)
History     CSN: [id number]  Arrival date & time 05/28/13  0801   First MD Initiated Contact with Patient 05/28/13 [phone]      Chief Complaint  Patient presents with  . Rectal Pain    (Consider location/radiation/quality/duration/timing/severity/associated sxs/prior treatment) HPI [name] [name] is a 27 y.o. female presents to the ER with multiple complaints. Primarily pt is here for rectal pain that she believes to be caused by hemorrhoids. Onset of rectal pain began last Tuesday after a very large bowel movement. Pt reports constipation 1 week prior that she treated with multiple enemas and stool softners. Of note pt reports having a hemorrhoid or that appeared 10 years ago s/p vaginal delivery, but she has never had pain or complications this bad since last week. Espon salt & preporation H, no relief. Pain is described as a throbbing heavy pressure that is not relieved by anything. Pain is exacerbated by BM, flatulence, movement, palpation and sitting.  In addition pt reports left foot pain, onset 1 week ago s/p large BM. Originally pain described as pin and needles in toes, then gradually moved to forefoot and now is worse at the achillis. Pt denies any trauma. Pain is constant. She reports that she has a stick shift and cant push clutch d/t pain. No F/NS/Chills.    Past Medical History  Diagnosis Date  . Anemia   . Abscess of axilla   . Depression     History reviewed. No pertinent past surgical history.  Family History  Problem Relation Age of Onset  . Hypertension Mother   . Diabetes type II Mother     History  Substance Use Topics  . Smoking status: Current Every Day Smoker -- 0.50 packs/day for 9 years    Types: Cigarettes  . Smokeless tobacco: Not on file  . Alcohol Use: 0.0 oz/week    OB History   Grav Para Term Preterm Abortions TAB SAB Ect Mult Living                  Review of Systems Ten systems reviewed and are negative for acute change, except as noted  in the HPI.   Allergies  Amoxicillin and Penicillins  Home Medications   Current Outpatient Rx  Name  Route  Sig  Dispense  Refill  . HYDROcodone-acetaminophen (NORCO/VICODIN) 5-325 MG per tablet   Oral   Take 2 tablets by mouth every 8 (eight) hours as needed for pain.   6 tablet   0     BP 107/65  Pulse 77  Temp(Src) 98.5 F (36.9 C) (Oral)  Resp 18  SpO2 100%  LMP 04/30/2013  Physical Exam  Nursing note and vitals reviewed. Constitutional: She is oriented to person, place, and time. She appears well-developed and well-nourished. No distress.  HENT:  Head: Normocephalic and atraumatic.  Eyes: Conjunctivae and EOM are normal.  Neck: Normal range of motion.  Pulmonary/Chest: Effort normal.  Genitourinary:  Rectal exam chaperoned. Tender prolapsed hemorrhoid without hardening, warmth, erythema or abnormal discoloration.   Musculoskeletal: Normal range of motion.  Left achilles & heal ttp, intact thompson sign. Full nl ROM  Neurological: She is alert and oriented to person, place, and time.  Skin: Skin is warm and dry. No rash noted. She is not diaphoretic.  Psychiatric: She has a normal mood and affect. Her behavior is normal.    ED Course  Procedures (including critical care time)  Labs Reviewed - No data to display No results found.  No diagnosis found.    MDM  Prolapsed rectal hemorrhoid  No signs of thrombosed or strain duration.  Patient discharged in stool softeners, Anusol, and recommendation to do sitz baths 3 times a day.  General surgeon followup given as well.  Strict return precautions discussed.        Jaci Carrel, [place] 05/28/13 1034

## 2013-05-28 NOTE — ED Provider Notes (Signed)
Medical screening examination/treatment/procedure(s) were conducted as a shared visit with non-physician practitioner(s) and myself.  I personally evaluated the patient during the encounter   Loren Racer, MD 05/28/13 1539

## 2013-05-28 NOTE — ED Notes (Signed)
Pt sts "Im having rectal pain that is affecting foot and moving up into ankles. "  Pt reports rectal pain x1 week.  C/o small amount of rectal bleeding during bowel movement and gas.

## 2013-05-31 ENCOUNTER — Encounter (HOSPITAL_COMMUNITY): Payer: Self-pay | Admitting: Emergency Medicine

## 2013-05-31 ENCOUNTER — Emergency Department (HOSPITAL_COMMUNITY)
Admission: EM | Admit: 2013-05-31 | Discharge: 2013-05-31 | Disposition: A | Discharge status: Home / Self Care | Payer: Medicaid Other | Attending: Emergency Medicine | Admitting: Emergency Medicine

## 2013-05-31 DIAGNOSIS — Z7982 Long term (current) use of aspirin: Secondary | ICD-10-CM | POA: Insufficient documentation

## 2013-05-31 DIAGNOSIS — Z8659 Personal history of other mental and behavioral disorders: Secondary | ICD-10-CM | POA: Insufficient documentation

## 2013-05-31 DIAGNOSIS — Z872 Personal history of diseases of the skin and subcutaneous tissue: Secondary | ICD-10-CM | POA: Insufficient documentation

## 2013-05-31 DIAGNOSIS — Z880 Allergy status to penicillin: Secondary | ICD-10-CM | POA: Insufficient documentation

## 2013-05-31 DIAGNOSIS — Z862 Personal history of diseases of the blood and blood-forming organs and certain disorders involving the immune mechanism: Secondary | ICD-10-CM | POA: Insufficient documentation

## 2013-05-31 DIAGNOSIS — F17200 Nicotine dependence, unspecified, uncomplicated: Secondary | ICD-10-CM | POA: Insufficient documentation

## 2013-05-31 DIAGNOSIS — Z79899 Other long term (current) drug therapy: Secondary | ICD-10-CM | POA: Insufficient documentation

## 2013-05-31 DIAGNOSIS — K649 Unspecified hemorrhoids: Secondary | ICD-10-CM | POA: Insufficient documentation

## 2013-05-31 HISTORY — DX: Unspecified hemorrhoids: K64.9

## 2013-05-31 MED ORDER — HYDROCORTISONE 2.5 % RE CREA: TOPICAL_CREAM | RECTAL | Status: DC

## 2013-05-31 NOTE — ED Notes (Addendum)
Pt c/o rectal pain x 2 days.  States hx of hemorrhoids.  Pt brought in bag of [employer].  Was told that she should not be eating until the doctor sees her.

## 2013-05-31 NOTE — ED Provider Notes (Signed)
History     CSN: [id number]  Arrival date & time 05/31/13  2725   First MD Initiated Contact with Patient 05/31/13 1002      Chief Complaint  Patient presents with  . Hemorrhoids    (Consider location/radiation/quality/duration/timing/severity/associated sxs/prior treatment) HPI Comments: Pt states that she is continuing to have rectal pain from her hemorrhoids;pt states that she was seen in the er a couple of days ago:pt states that she couldn't afford the medications the other day so she came back in:pt states that she is trying to follow up with DR. Newman with surgery, but she can't get her doctors office to call back so she could get a referral:pt states that she is not having a hard time having bms any more but she is continuing to have rectal pain  The history is provided by the patient. No language interpreter was used.    Past Medical History  Diagnosis Date  . Anemia   . Abscess of axilla   . Depression   . Hemorrhoids     History reviewed. No pertinent past surgical history.  Family History  Problem Relation Age of Onset  . Hypertension Mother   . Diabetes type II Mother     History  Substance Use Topics  . Smoking status: Current Every Day Smoker -- 0.50 packs/day for 9 years    Types: Cigarettes  . Smokeless tobacco: Not on file  . Alcohol Use: 0.0 oz/week    OB History   Grav Para Term Preterm Abortions TAB SAB Ect Mult Living                  Review of Systems  Constitutional: Negative.   Respiratory: Negative.   Cardiovascular: Negative.     Allergies  Amoxicillin and Penicillins  Home Medications   Current Outpatient Rx  Name  Route  Sig  Dispense  Refill  . aspirin-acetaminophen-caffeine (EXCEDRIN MIGRAINE) 250-250-65 MG per tablet   Oral   Take 2 tablets by mouth every 6 (six) hours as needed for pain (headache).         . Multiple Vitamin (MULTIVITAMIN WITH MINERALS) TABS   Oral   Take 1 tablet by mouth every morning.          Marland Kitchen oxyCODONE-acetaminophen (PERCOCET/ROXICET) 5-325 MG per tablet   Oral   Take 1 tablet by mouth every 4 (four) hours as needed for pain.         . hydrocortisone (ANUSOL-HC) 2.5 % rectal cream      Apply rectally 2 times daily   30 g   0   . hydrocortisone (ANUSOL-HC) 25 MG suppository   Rectal   Place 1 suppository (25 mg total) rectally 2 (two) times daily. For 7 days   14 suppository   0   . polyethylene glycol (MIRALAX / GLYCOLAX) packet   Oral   Take 17 g by mouth daily as needed (for constipation).           BP 96/51  Pulse 59  Temp(Src) 98.3 F (36.8 C) (Oral)  Resp 16  SpO2 100%  LMP 04/30/2013  Physical Exam  Nursing note and vitals reviewed. Constitutional: She appears well-developed and well-nourished.  Cardiovascular: Normal rate and regular rhythm.   Pulmonary/Chest: Effort normal and breath sounds normal.  Abdominal: Soft. Bowel sounds are normal. There is no tenderness.  Genitourinary:  Pt has rectal prolapse noted  Musculoskeletal: Normal range of motion.    ED Course  Procedures (including critical  care time)  Labs Reviewed - No data to display No results found.   1. Hemorrhoid       MDM  Will treat with anusol:pt is okay to follow up with pcp       Teressa Lower, NP 05/31/13 1043

## 2013-06-01 NOTE — ED Provider Notes (Signed)
Medical screening examination/treatment/procedure(s) were performed by non-physician practitioner and as supervising physician I was immediately available for consultation/collaboration.  Toy Baker, MD 06/01/13 1538

## 2013-06-04 ENCOUNTER — Ambulatory Visit (INDEPENDENT_AMBULATORY_CARE_PROVIDER_SITE_OTHER): Payer: Medicaid Other | Admitting: Surgery

## 2013-06-04 ENCOUNTER — Encounter (INDEPENDENT_AMBULATORY_CARE_PROVIDER_SITE_OTHER): Payer: Self-pay | Admitting: Surgery

## 2013-06-04 VITALS — BP 110/58 | HR 64 | Temp 98.4°F | Resp 14 | Ht 67.0 in | Wt 125.8 lb

## 2013-06-04 DIAGNOSIS — K602 Anal fissure, unspecified: Secondary | ICD-10-CM

## 2013-06-04 NOTE — Progress Notes (Signed)
Subjective:     Patient ID: [name] [name], female   DOB: 10-12-1986, 27 y.o.   MRN: [id number]  HPI Patient sent at the request of the emergency room for rectal pain x2 weeks. She's been seen twice in the emergency room has been treated for hemorrhoids. The pain is made worse with bowel movements. There is some bleeding with her bowel movements. She is on a stool softener and MiraLAX. She's been applying Anusol. Pain made worse with straining.  Review of Systems  Respiratory: Negative.   Cardiovascular: Negative.   Gastrointestinal: Positive for rectal pain.  Musculoskeletal: Negative.   Skin: Negative.   Hematological: Negative.        Objective:   Physical Exam  Constitutional: She is oriented to person, place, and time. She appears well-developed and well-nourished.  Genitourinary:     Neurological: She is alert and oriented to person, place, and time.  Skin: Skin is warm and dry.  Psychiatric: She has a normal mood and affect. Her behavior is normal. Thought content normal.       Assessment:     Anal fissure    Plan:     Recommend high fiber diet, warm tub soaks, topical diltiazem jail every 6 hours continue with stool softener and MiraLAX twice a day. Increase dietary fiber. No prolonged straining on commode. Return 3 weeks.

## 2013-06-04 NOTE — Patient Instructions (Signed)
Anal Fissure, Adult  An anal fissure is a small tear or crack in the skin around the anus. Bleeding from a fissure usually stops on its own within a few minutes. However, bleeding will often reoccur with each bowel movement until the crack heals.   CAUSES    Passing large, hard stools.   Frequent diarrheal stools.   Constipation.   Inflammatory bowel disease (Crohn's disease or ulcerative colitis).   Infections.   Anal sex.  SYMPTOMS    Small amounts of blood seen on your stools, on toilet paper, or in the toilet after a bowel movement.   Rectal bleeding.   Painful bowel movements.   Itching or irritation around the anus.  DIAGNOSIS  Your caregiver will examine the anal area. An anal fissure can usually be seen with careful inspection. A rectal exam may be performed and a short tube (anoscope) may be used to examine the anal canal.  TREATMENT    You may be instructed to take fiber supplements. These supplements can soften your stool to help make bowel movements easier.   Sitz baths may be recommended to help heal the tear. Do not use soap in the sitz baths.   A medicated cream or ointment may be prescribed to lessen discomfort.  HOME CARE INSTRUCTIONS    Maintain a diet high in fruits, whole grains, and vegetables. Avoid constipating foods like bananas and dairy products.   Take sitz baths as directed by your caregiver.   Drink enough fluids to keep your urine clear or pale yellow.   Only take over-the-counter or prescription medicines for pain, discomfort, or fever as directed by your caregiver. Do not take aspirin as this may increase bleeding.   Do not use ointments containing numbing medications (anesthetics) or hydrocortisone. They could slow healing.  SEEK MEDICAL CARE IF:    Your fissure is not completely healed within 3 days.   You have further bleeding.   You have a fever.   You have diarrhea mixed with blood.   You have pain.   Your problem is getting worse rather than  better.  MAKE SURE YOU:    Understand these instructions.   Will watch your condition.   Will get help right away if you are not doing well or get worse.  Document Released: 12/04/2005 Document Revised: 02/26/2012 Document Reviewed: 05/21/2011  [employer] Patient Information 2014 [employer]

## 2013-06-17 ENCOUNTER — Ambulatory Visit (INDEPENDENT_AMBULATORY_CARE_PROVIDER_SITE_OTHER): Payer: Medicaid Other | Admitting: General Surgery

## 2013-06-27 ENCOUNTER — Encounter (INDEPENDENT_AMBULATORY_CARE_PROVIDER_SITE_OTHER): Payer: Medicaid Other | Admitting: Surgery

## 2013-07-07 ENCOUNTER — Encounter (INDEPENDENT_AMBULATORY_CARE_PROVIDER_SITE_OTHER): Payer: Self-pay | Admitting: Surgery

## 2013-09-02 ENCOUNTER — Encounter (HOSPITAL_COMMUNITY): Payer: Self-pay | Admitting: Emergency Medicine

## 2013-09-02 ENCOUNTER — Emergency Department (HOSPITAL_COMMUNITY)
Admission: EM | Admit: 2013-09-02 | Discharge: 2013-09-02 | Disposition: A | Discharge status: Home / Self Care | Payer: Medicaid Other | Attending: Emergency Medicine | Admitting: Emergency Medicine

## 2013-09-02 DIAGNOSIS — R011 Cardiac murmur, unspecified: Secondary | ICD-10-CM | POA: Insufficient documentation

## 2013-09-02 DIAGNOSIS — K089 Disorder of teeth and supporting structures, unspecified: Secondary | ICD-10-CM | POA: Insufficient documentation

## 2013-09-02 DIAGNOSIS — Z8679 Personal history of other diseases of the circulatory system: Secondary | ICD-10-CM | POA: Insufficient documentation

## 2013-09-02 DIAGNOSIS — K0889 Other specified disorders of teeth and supporting structures: Secondary | ICD-10-CM

## 2013-09-02 DIAGNOSIS — K1370 Unspecified lesions of oral mucosa: Secondary | ICD-10-CM | POA: Insufficient documentation

## 2013-09-02 DIAGNOSIS — K006 Disturbances in tooth eruption: Secondary | ICD-10-CM | POA: Insufficient documentation

## 2013-09-02 DIAGNOSIS — Z8659 Personal history of other mental and behavioral disorders: Secondary | ICD-10-CM | POA: Insufficient documentation

## 2013-09-02 DIAGNOSIS — R51 Headache: Secondary | ICD-10-CM | POA: Insufficient documentation

## 2013-09-02 DIAGNOSIS — F17200 Nicotine dependence, unspecified, uncomplicated: Secondary | ICD-10-CM | POA: Insufficient documentation

## 2013-09-02 DIAGNOSIS — Z880 Allergy status to penicillin: Secondary | ICD-10-CM | POA: Insufficient documentation

## 2013-09-02 DIAGNOSIS — Z872 Personal history of diseases of the skin and subcutaneous tissue: Secondary | ICD-10-CM | POA: Insufficient documentation

## 2013-09-02 MED ORDER — HYDROCODONE-ACETAMINOPHEN 5-325 MG PO TABS: 1.0000 | ORAL_TABLET | Freq: Four times a day (QID) | ORAL | Status: DC | PRN

## 2013-09-02 MED ORDER — HYDROCODONE-ACETAMINOPHEN 5-325 MG PO TABS
2.0000 | ORAL_TABLET | Freq: Once | ORAL | Status: AC
  Administered 2013-09-02: 2 via ORAL
  Filled 2013-09-02: qty 2

## 2013-09-02 MED ORDER — CLINDAMYCIN HCL 150 MG PO CAPS: 450.0000 mg | ORAL_CAPSULE | Freq: Three times a day (TID) | ORAL | Status: DC

## 2013-09-02 NOTE — ED Notes (Signed)
Rt side tooth pain and head pain for the past 2 weeks now with some swelling to rt cheek area. States that she does not have a lot of money for prescription and needs some help with getting medications.

## 2013-09-02 NOTE — ED Provider Notes (Signed)
CSN: [id number]     Arrival date & time 09/02/13  1327 History   First MD Initiated Contact with Patient 09/02/13 1426     Chief Complaint  Patient presents with  . Dental Pain   (Consider location/radiation/quality/duration/timing/severity/associated sxs/prior Treatment) HPI Comments: Patient presenting with right upper tooth pain that has been present intermittently over the past 2 weeks.  Pain worsened yesterday.  Denies dental injury or trauma.  She has been taking Tylenol and Ibuprofen for the pain, but does not feel that it is helping.  She currently does not have a dentist.  Patient is a 27 y.o. female presenting with tooth pain. The history is provided by the patient.  Dental Pain Progression:  Worsening Worsened by:  Cold food/drink and pressure Associated symptoms: gum swelling and headaches   Associated symptoms: no difficulty swallowing, no drooling, no facial pain, no facial swelling, no fever, no neck pain, no neck swelling and no trismus     Past Medical History  Diagnosis Date  . Anemia   . Abscess of axilla   . Depression   . Hemorrhoids   . Blood transfusion without reported diagnosis   . Heart murmur   . Substance abuse    History reviewed. No pertinent past surgical history. Family History  Problem Relation Age of Onset  . Hypertension Mother   . Diabetes type II Mother    History  Substance Use Topics  . Smoking status: Current Every Day Smoker -- 0.50 packs/day for 9 years    Types: Cigarettes  . Smokeless tobacco: Not on file  . Alcohol Use: No   OB History   Grav Para Term Preterm Abortions TAB SAB Ect Mult Living                 Review of Systems  Constitutional: Negative for fever.  HENT: Positive for dental problem. Negative for facial swelling, drooling and neck pain.   Neurological: Positive for headaches.  All other systems reviewed and are negative.    Allergies  Amoxicillin and Penicillins  Home Medications   Current  Outpatient Rx  Name  Route  Sig  Dispense  Refill  . acetaminophen (TYLENOL) 500 MG tablet   Oral   Take 1,000 mg by mouth every 6 (six) hours as needed for pain.         Marland Kitchen aspirin-acetaminophen-caffeine (EXCEDRIN MIGRAINE) 250-250-65 MG per tablet   Oral   Take 2 tablets by mouth every 6 (six) hours as needed for pain (headache).         Marland Kitchen ibuprofen (ADVIL,MOTRIN) 200 MG tablet   Oral   Take 600 mg by mouth every 6 (six) hours as needed for pain.          BP 91/60  Pulse 70  Temp(Src) 98.3 F (36.8 C) (Oral)  Resp 22  SpO2 100% Physical Exam  Nursing note and vitals reviewed. Constitutional: She is oriented to person, place, and time. She appears well-developed and well-nourished. No distress.  HENT:  Head: Normocephalic and atraumatic.  Mouth/Throat: Uvula is midline, oropharynx is clear and moist and mucous membranes are normal. No trismus in the jaw. Abnormal dentition. No dental abscesses or edematous. No oropharyngeal exudate, posterior oropharyngeal edema, posterior oropharyngeal erythema or tonsillar abscesses.    Poor dental hygiene. Pt able to open and close mouth with out difficulty. Airway intact. Uvula midline. Mild gingival swelling with tenderness over affected area, but no fluctuance. No swelling or tenderness of submental and submandibular regions.  No sublingual tenderness or elevation of the tongue  Eyes: Conjunctivae and EOM are normal.  Neck: Normal range of motion and full passive range of motion without pain. Neck supple.  Cardiovascular: Normal rate and regular rhythm.   Pulmonary/Chest: Effort normal and breath sounds normal. No respiratory distress. She has no wheezes.  Musculoskeletal: Normal range of motion.  Lymphadenopathy:       Head (right side): No submental, no submandibular, no tonsillar, no preauricular and no posterior auricular adenopathy present.       Head (left side): No submental, no submandibular, no tonsillar, no preauricular and  no posterior auricular adenopathy present.    She has no cervical adenopathy.  Neurological: She is alert and oriented to person, place, and time.  Skin: Skin is warm and dry. No rash noted. She is not diaphoretic.    ED Course  Procedures (including critical care time) Labs Review Labs Reviewed - No data to display Imaging Review No results found.  MDM  No diagnosis found. Patient with toothache.  No gross abscess.  Exam unconcerning for Ludwig's angina or spread of infection.  Will treat with penicillin and pain medicine.  Urged patient to follow-up with dentist.      Pascal Lux [place], PA-C 09/03/13 1007

## 2013-09-03 NOTE — ED Provider Notes (Signed)
Medical screening examination/treatment/procedure(s) were performed by non-physician practitioner and as supervising physician I was immediately available for consultation/collaboration.    Truc Winfree D Hether Anselmo, MD 09/03/13 1932 

## 2013-12-19 ENCOUNTER — Emergency Department (HOSPITAL_COMMUNITY): Payer: Medicaid Other

## 2013-12-19 ENCOUNTER — Emergency Department (HOSPITAL_COMMUNITY)
Admission: EM | Admit: 2013-12-19 | Discharge: 2013-12-19 | Disposition: A | Discharge status: Home / Self Care | Payer: Medicaid Other | Attending: Emergency Medicine | Admitting: Emergency Medicine

## 2013-12-19 ENCOUNTER — Encounter (HOSPITAL_COMMUNITY): Payer: Self-pay | Admitting: Emergency Medicine

## 2013-12-19 DIAGNOSIS — F17200 Nicotine dependence, unspecified, uncomplicated: Secondary | ICD-10-CM | POA: Insufficient documentation

## 2013-12-19 DIAGNOSIS — R110 Nausea: Secondary | ICD-10-CM | POA: Insufficient documentation

## 2013-12-19 DIAGNOSIS — R5381 Other malaise: Secondary | ICD-10-CM | POA: Insufficient documentation

## 2013-12-19 DIAGNOSIS — R209 Unspecified disturbances of skin sensation: Secondary | ICD-10-CM | POA: Insufficient documentation

## 2013-12-19 DIAGNOSIS — R5383 Other fatigue: Secondary | ICD-10-CM

## 2013-12-19 DIAGNOSIS — Z3202 Encounter for pregnancy test, result negative: Secondary | ICD-10-CM | POA: Insufficient documentation

## 2013-12-19 DIAGNOSIS — Z5900 Homelessness unspecified: Secondary | ICD-10-CM | POA: Insufficient documentation

## 2013-12-19 DIAGNOSIS — D649 Anemia, unspecified: Secondary | ICD-10-CM

## 2013-12-19 DIAGNOSIS — R42 Dizziness and giddiness: Secondary | ICD-10-CM

## 2013-12-19 DIAGNOSIS — Z8679 Personal history of other diseases of the circulatory system: Secondary | ICD-10-CM | POA: Insufficient documentation

## 2013-12-19 DIAGNOSIS — R011 Cardiac murmur, unspecified: Secondary | ICD-10-CM | POA: Insufficient documentation

## 2013-12-19 DIAGNOSIS — J029 Acute pharyngitis, unspecified: Secondary | ICD-10-CM | POA: Insufficient documentation

## 2013-12-19 DIAGNOSIS — E860 Dehydration: Secondary | ICD-10-CM

## 2013-12-19 DIAGNOSIS — G478 Other sleep disorders: Secondary | ICD-10-CM | POA: Insufficient documentation

## 2013-12-19 DIAGNOSIS — Z792 Long term (current) use of antibiotics: Secondary | ICD-10-CM | POA: Insufficient documentation

## 2013-12-19 DIAGNOSIS — Z872 Personal history of diseases of the skin and subcutaneous tissue: Secondary | ICD-10-CM | POA: Insufficient documentation

## 2013-12-19 DIAGNOSIS — Z8659 Personal history of other mental and behavioral disorders: Secondary | ICD-10-CM | POA: Insufficient documentation

## 2013-12-19 DIAGNOSIS — Z880 Allergy status to penicillin: Secondary | ICD-10-CM | POA: Insufficient documentation

## 2013-12-19 LAB — CBC
HEMATOCRIT: 27.2 % — AB (ref 36.0–46.0)
HEMOGLOBIN: 8.3 g/dL — AB (ref 12.0–15.0)
MCH: 18 pg — ABNORMAL LOW (ref 26.0–34.0)
MCHC: 30.5 g/dL (ref 30.0–36.0)
MCV: 59.1 fL — AB (ref 78.0–100.0)
Platelets: ADEQUATE 10*3/uL (ref 150–400)
RBC: 4.6 MIL/uL (ref 3.87–5.11)
RDW: 21.1 % — ABNORMAL HIGH (ref 11.5–15.5)
WBC: 4.8 10*3/uL (ref 4.0–10.5)

## 2013-12-19 LAB — URINALYSIS, ROUTINE W REFLEX MICROSCOPIC
Bilirubin Urine: NEGATIVE
GLUCOSE, UA: NEGATIVE mg/dL
HGB URINE DIPSTICK: NEGATIVE
Ketones, ur: >80 mg/dL — AB
Leukocytes, UA: NEGATIVE
Nitrite: NEGATIVE
Protein, ur: NEGATIVE mg/dL
SPECIFIC GRAVITY, URINE: 1.033 — AB (ref 1.005–1.030)
Urobilinogen, UA: 1 mg/dL (ref 0.0–1.0)
pH: 6 (ref 5.0–8.0)

## 2013-12-19 LAB — POCT I-STAT TROPONIN I: TROPONIN I, POC: 0 ng/mL (ref 0.00–0.08)

## 2013-12-19 LAB — POCT I-STAT, CHEM 8
BUN: 9 mg/dL (ref 6–23)
CALCIUM ION: 1.29 mmol/L — AB (ref 1.12–1.23)
CHLORIDE: 107 meq/L (ref 96–112)
Creatinine, Ser: 0.6 mg/dL (ref 0.50–1.10)
GLUCOSE: 85 mg/dL (ref 70–99)
HCT: 33 % — ABNORMAL LOW (ref 36.0–46.0)
Hemoglobin: 11.2 g/dL — ABNORMAL LOW (ref 12.0–15.0)
Potassium: 3.9 mEq/L (ref 3.7–5.3)
Sodium: 142 mEq/L (ref 137–147)
TCO2: 22 mmol/L (ref 0–100)

## 2013-12-19 LAB — POCT PREGNANCY, URINE: Preg Test, Ur: NEGATIVE

## 2013-12-19 MED ORDER — SODIUM CHLORIDE 0.9 % IV BOLUS (SEPSIS): 1000.0000 mL | Freq: Once | INTRAVENOUS | Status: DC

## 2013-12-19 MED ORDER — ACETAMINOPHEN 325 MG PO TABS
650.0000 mg | ORAL_TABLET | Freq: Once | ORAL | Status: AC
  Administered 2013-12-19: 650 mg via ORAL
  Filled 2013-12-19: qty 2

## 2013-12-19 MED ORDER — ONDANSETRON 4 MG PO TBDP
4.0000 mg | ORAL_TABLET | Freq: Once | ORAL | Status: AC
  Administered 2013-12-19: 4 mg via ORAL
  Filled 2013-12-19: qty 1

## 2013-12-19 MED ORDER — ONDANSETRON HCL 4 MG/2ML IJ SOLN: 4.0000 mg | Freq: Once | INTRAMUSCULAR | Status: DC

## 2013-12-19 NOTE — ED Notes (Signed)
Pt is having visible trembling since this morning. Pt appears fatigued.

## 2013-12-19 NOTE — ED Provider Notes (Signed)
CSN: [id number]     Arrival date & time 12/19/13  1039 History   First MD Initiated Contact with Patient 12/19/13 1113     Chief Complaint  Patient presents with  . Near Syncope   (Consider location/radiation/quality/duration/timing/severity/associated sxs/prior Treatment) HPI [name] [name] is a 28 y.o. female who presents emergency department complaining of generalized weakness, congestion, nausea, and "blackout episodes." Patient states she has history of syncope and near syncopal episodes, states that she was told that her iron was low. Patient was to be taking iron but she's not currently taking it. Patient states that episodes have increased in frequency in the last month and specifically the last 2 days. Patient states that she is not losing consciousness with these episodes right now but states her vision gets black, she gets tingly all over, gets nauseated. Patient states that she is awake and able to carry a conversation when this happens. Patient states that she has had approximately 6 episodes since this morning. She states her last anywhere from seconds to a minute. She states they do get worse with standing and changing in position. Patient denies any recent illness, travel, surgeries. She states that she is currently under a lot of stress. He is homeless and staying with a friend in a hotel room but it this is not a good situation. She states she has 2 kids. States unable to get in the shelter and has nowhere to go.  Past Medical History  Diagnosis Date  . Anemia   . Abscess of axilla   . Depression   . Hemorrhoids   . Blood transfusion without reported diagnosis   . Heart murmur   . Substance abuse    History reviewed. No pertinent past surgical history. Family History  Problem Relation Age of Onset  . Hypertension Mother   . Diabetes type II Mother    History  Substance Use Topics  . Smoking status: Current Every Day Smoker -- 0.50 packs/day for 9 years    Types:  Cigarettes  . Smokeless tobacco: Not on file  . Alcohol Use: 0.0 oz/week   OB History   Grav Para Term Preterm Abortions TAB SAB Ect Mult Living                 Review of Systems  Constitutional: Negative for fever and chills.  HENT: Positive for congestion and sore throat. Negative for ear pain.   Respiratory: Positive for chest tightness. Negative for cough and shortness of breath.   Cardiovascular: Negative for chest pain, palpitations and leg swelling.  Gastrointestinal: Positive for nausea. Negative for vomiting, abdominal pain and diarrhea.  Genitourinary: Negative for dysuria, flank pain, vaginal bleeding, vaginal discharge, vaginal pain and pelvic pain.  Musculoskeletal: Negative for arthralgias, myalgias, neck pain and neck stiffness.  Skin: Negative for rash.  Neurological: Positive for dizziness. Negative for seizures, weakness, numbness and headaches.  All other systems reviewed and are negative.    Allergies  Amoxicillin and Penicillins  Home Medications   Current Outpatient Rx  Name  Route  Sig  Dispense  Refill  . acetaminophen (TYLENOL) 500 MG tablet   Oral   Take 1,000 mg by mouth every 6 (six) hours as needed for pain.         Marland Kitchen aspirin-acetaminophen-caffeine (EXCEDRIN MIGRAINE) 250-250-65 MG per tablet   Oral   Take 2 tablets by mouth every 6 (six) hours as needed for pain (headache).         . clindamycin (CLEOCIN)  150 MG capsule   Oral   Take 3 capsules (450 mg total) by mouth 3 (three) times daily.   90 capsule   0   . HYDROcodone-acetaminophen (NORCO/VICODIN) 5-325 MG per tablet   Oral   Take 1-2 tablets by mouth every 6 (six) hours as needed for pain.   15 tablet   0   . ibuprofen (ADVIL,MOTRIN) 200 MG tablet   Oral   Take 600 mg by mouth every 6 (six) hours as needed for pain.          BP 118/83  Pulse 80  Temp(Src) 98.3 F (36.8 C) (Oral)  Resp 18  Wt 119 lb (53.978 kg)  SpO2 100%  LMP 11/18/2013 Physical Exam  Nursing  note and vitals reviewed. Constitutional: She is oriented to person, place, and time. She appears well-developed and well-nourished. No distress.  HENT:  Head: Normocephalic and atraumatic.  Right Ear: External ear normal.  Left Ear: External ear normal.  Nose: Nose normal.  Mouth/Throat: Oropharynx is clear and moist.  Eyes: Conjunctivae and EOM are normal. Pupils are equal, round, and reactive to light.  Neck: Normal range of motion. Neck supple.  Cardiovascular: Normal rate, regular rhythm and normal heart sounds.   Pulmonary/Chest: Effort normal and breath sounds normal. No respiratory distress. She has no wheezes. She has no rales.  Abdominal: Soft. Bowel sounds are normal. She exhibits no distension. There is no tenderness. There is no rebound.  Musculoskeletal: Normal range of motion. She exhibits no edema.  Lymphadenopathy:    She has no cervical adenopathy.  Neurological: She is alert and oriented to person, place, and time. No cranial nerve deficit. Coordination normal.  Skin: Skin is warm and dry.  Psychiatric: She has a normal mood and affect. Her behavior is normal.    ED Course  Procedures (including critical care time) Labs Review Labs Reviewed  URINALYSIS, ROUTINE W REFLEX MICROSCOPIC - Abnormal; Notable for the following:    Specific Gravity, Urine 1.033 (*)    Ketones, ur >80 (*)    All other components within normal limits  CBC - Abnormal; Notable for the following:    Hemoglobin 8.3 (*)    HCT 27.2 (*)    MCV 59.1 (*)    MCH 18.0 (*)    RDW 21.1 (*)    All other components within normal limits  POCT I-STAT, CHEM 8 - Abnormal; Notable for the following:    Calcium, Ion 1.29 (*)    Hemoglobin 11.2 (*)    HCT 33.0 (*)    All other components within normal limits  POCT I-STAT TROPONIN I  POCT PREGNANCY, URINE   Imaging Review Dg Chest 2 View  12/19/2013   CLINICAL DATA:  Near syncope  EXAM: CHEST  2 VIEW  COMPARISON:  04/09/2010  FINDINGS: Heart size is  normal. Mediastinal shadows are normal. The lungs are clear. The vascularity is normal. No effusions. No bony abnormalities.  IMPRESSION: Normal chest   Electronically Signed   By: Paulina Fusi M.D.   On: 12/19/2013 12:57    EKG Interpretation   None       MDM   1. Dizziness   2. Anemia   3. Dehydration      Pt with dizzy episodes, "seeing black spots." No syncopal episodes, no concern for seizures. Admits to nausea as well. Also reports being under a lot of stress, currently homeless. Given zofran for nausea. Will check UA, urine preg, labs.   1:35 PM  Labs show anemia with hgb of 8.3. This does appear to be pt's baseline. She is supposed to bo on iron, but not taking it. Instructed to restart. UA shows >80 ketones, and dehydration with elevated specific gravity. Difficulty obtaining IV in ED, oral hydration provided. Instructed to drink plenty of fluids at home. Follow up with PCP.   Filed Vitals:   12/19/13 1259 12/19/13 1300 12/19/13 1330 12/19/13 1402  BP: 108/60 108/60 98/59 96/54   Pulse: 62 61  58  Temp:    98 F (36.7 C)  TempSrc:      Resp:  15 14 18   Weight:      SpO2:  100%  100%     Lottie Musselatyana A Aloni Chuang, PA-C 12/19/13 2029

## 2013-12-19 NOTE — Discharge Instructions (Signed)
Restart taking your iron supplements. Take colace for constipation. Make sure to drink plenty of fluids to prevent dehydration. Follow up with your primary care doctor for recheck next week.    Anemia, Nonspecific Anemia is a condition in which the concentration of red blood cells or hemoglobin in the blood is below normal. Hemoglobin is a substance in red blood cells that carries oxygen to the tissues of the body. Anemia results in not enough oxygen reaching these tissues.  CAUSES  Common causes of anemia include:   Excessive bleeding. Bleeding may be internal or external. This includes excessive bleeding from periods (in women) or from the intestine.   Poor nutrition.   Chronic kidney, thyroid, and liver disease.  Bone marrow disorders that decrease red blood cell production.  Cancer and treatments for cancer.  HIV, AIDS, and their treatments.  Spleen problems that increase red blood cell destruction.  Blood disorders.  Excess destruction of red blood cells due to infection, medicines, and autoimmune disorders. SIGNS AND SYMPTOMS   Minor weakness.   Dizziness.   Headache.  Palpitations.   Shortness of breath, especially with exercise.   Paleness.  Cold sensitivity.  Indigestion.  Nausea.  Difficulty sleeping.  Difficulty concentrating. Symptoms may occur suddenly or they may develop slowly.  DIAGNOSIS  Additional blood tests are often needed. These help your health care provider determine the best treatment. Your health care provider will check your stool for blood and look for other causes of blood loss.  TREATMENT  Treatment varies depending on the cause of the anemia. Treatment can include:   Supplements of iron, vitamin B12, or folic acid.   Hormone medicines.   A blood transfusion. This may be needed if blood loss is severe.   Hospitalization. This may be needed if there is significant continual blood loss.   Dietary changes.  Spleen  removal. HOME CARE INSTRUCTIONS Keep all follow-up appointments. It often takes many weeks to correct anemia, and having your health care provider check on your condition and your response to treatment is very important. SEEK IMMEDIATE MEDICAL CARE IF:   You develop extreme weakness, shortness of breath, or chest pain.   You become dizzy or have trouble concentrating.  You develop heavy vaginal bleeding.   You develop a rash.   You have bloody or black, tarry stools.   You faint.   You vomit up blood.   You vomit repeatedly.   You have abdominal pain.  You have a fever or persistent symptoms for more than 2 3 days.   You have a fever and your symptoms suddenly get worse.   You are dehydrated.  MAKE SURE YOU:  Understand these instructions.  Will watch your condition.  Will get help right away if you are not doing well or get worse. Document Released: 01/11/2005 Document Revised: 08/06/2013 Document Reviewed: 05/30/2013 [facility] Patient Information 2014 [place], [place].  Dehydration, Adult Dehydration is when you lose more fluids from the body than you take in. Vital organs like the kidneys, brain, and heart cannot function without a proper amount of fluids and salt. Any loss of fluids from the body can cause dehydration.  CAUSES   Vomiting.  Diarrhea.  Excessive sweating.  Excessive urine output.  Fever. SYMPTOMS  Mild dehydration  Thirst.  Dry lips.  Slightly dry mouth. Moderate dehydration  Very dry mouth.  Sunken eyes.  Skin does not bounce back quickly when lightly pinched and released.  Dark urine and decreased urine production.  Decreased  tear production.  Headache. Severe dehydration  Very dry mouth.  Extreme thirst.  Rapid, weak pulse (more than 100 beats per minute at rest).  Cold hands and feet.  Not able to sweat in spite of heat and temperature.  Rapid breathing.  Blue lips.  Confusion and  lethargy.  Difficulty being awakened.  Minimal urine production.  No tears. DIAGNOSIS  Your caregiver will diagnose dehydration based on your symptoms and your exam. Blood and urine tests will help confirm the diagnosis. The diagnostic evaluation should also identify the cause of dehydration. TREATMENT  Treatment of mild or moderate dehydration can often be done at home by increasing the amount of fluids that you drink. It is best to drink small amounts of fluid more often. Drinking too much at one time can make vomiting worse. Refer to the home care instructions below. Severe dehydration needs to be treated at the hospital where you will probably be given intravenous (IV) fluids that contain water and electrolytes. HOME CARE INSTRUCTIONS   Ask your caregiver about specific rehydration instructions.  Drink enough fluids to keep your urine clear or pale yellow.  Drink small amounts frequently if you have nausea and vomiting.  Eat as you normally do.  Avoid:  Foods or drinks high in sugar.  Carbonated drinks.  Juice.  Extremely hot or cold fluids.  Drinks with caffeine.  Fatty, greasy foods.  Alcohol.  Tobacco.  Overeating.  Gelatin desserts.  Wash your hands well to avoid spreading bacteria and viruses.  Only take over-the-counter or prescription medicines for pain, discomfort, or fever as directed by your caregiver.  Ask your caregiver if you should continue all prescribed and over-the-counter medicines.  Keep all follow-up appointments with your caregiver. SEEK MEDICAL CARE IF:  You have abdominal pain and it increases or stays in one area (localizes).  You have a rash, stiff neck, or severe headache.  You are irritable, sleepy, or difficult to awaken.  You are weak, dizzy, or extremely thirsty. SEEK IMMEDIATE MEDICAL CARE IF:   You are unable to keep fluids down or you get worse despite treatment.  You have frequent episodes of vomiting or  diarrhea.  You have blood or green matter (bile) in your vomit.  You have blood in your stool or your stool looks black and tarry.  You have not urinated in 6 to 8 hours, or you have only urinated a small amount of very dark urine.  You have a fever.  You faint. MAKE SURE YOU:   Understand these instructions.  Will watch your condition.  Will get help right away if you are not doing well or get worse. Document Released: 12/04/2005 Document Revised: 02/26/2012 Document Reviewed: 07/24/2011 [facility] Patient Information 2014 [place]

## 2013-12-19 NOTE — ED Provider Notes (Signed)
Medical screening examination/treatment/procedure(s) were performed by non-physician practitioner and as supervising physician I was immediately available for consultation/collaboration.  EKG Interpretation   None         Gwyneth SproutWhitney Kervens Roper, MD 12/19/13 2152

## 2013-12-19 NOTE — ED Notes (Signed)
The patient complained of dizziness during orthostatic (standing) vitals. The RN in charge is aware.

## 2013-12-19 NOTE — ED Notes (Signed)
Patient states "I know my iron is low, but I eat a lot of good food".   Patient states I have been "kinda blacking out.  I see black spots..  I will be talking to you, and you won't know it, but I know".   Patient denies loss of consciousness.   Patient states has nausea x 3 days with no vomiting.

## 2014-01-30 ENCOUNTER — Emergency Department (HOSPITAL_COMMUNITY)
Admission: EM | Admit: 2014-01-30 | Discharge: 2014-01-30 | Disposition: A | Discharge status: Home / Self Care | Payer: Medicaid Other | Attending: Emergency Medicine | Admitting: Emergency Medicine

## 2014-01-30 ENCOUNTER — Encounter (HOSPITAL_COMMUNITY): Payer: Self-pay | Admitting: Emergency Medicine

## 2014-01-30 ENCOUNTER — Emergency Department (HOSPITAL_COMMUNITY): Payer: Medicaid Other

## 2014-01-30 DIAGNOSIS — Z8659 Personal history of other mental and behavioral disorders: Secondary | ICD-10-CM | POA: Insufficient documentation

## 2014-01-30 DIAGNOSIS — Z872 Personal history of diseases of the skin and subcutaneous tissue: Secondary | ICD-10-CM | POA: Insufficient documentation

## 2014-01-30 DIAGNOSIS — S90129A Contusion of unspecified lesser toe(s) without damage to nail, initial encounter: Secondary | ICD-10-CM | POA: Insufficient documentation

## 2014-01-30 DIAGNOSIS — D649 Anemia, unspecified: Secondary | ICD-10-CM | POA: Insufficient documentation

## 2014-01-30 DIAGNOSIS — F17200 Nicotine dependence, unspecified, uncomplicated: Secondary | ICD-10-CM | POA: Insufficient documentation

## 2014-01-30 DIAGNOSIS — Z8719 Personal history of other diseases of the digestive system: Secondary | ICD-10-CM | POA: Insufficient documentation

## 2014-01-30 DIAGNOSIS — W2203XA Walked into furniture, initial encounter: Secondary | ICD-10-CM | POA: Insufficient documentation

## 2014-01-30 DIAGNOSIS — R011 Cardiac murmur, unspecified: Secondary | ICD-10-CM | POA: Insufficient documentation

## 2014-01-30 DIAGNOSIS — Y929 Unspecified place or not applicable: Secondary | ICD-10-CM | POA: Insufficient documentation

## 2014-01-30 DIAGNOSIS — R269 Unspecified abnormalities of gait and mobility: Secondary | ICD-10-CM | POA: Insufficient documentation

## 2014-01-30 DIAGNOSIS — Y9389 Activity, other specified: Secondary | ICD-10-CM | POA: Insufficient documentation

## 2014-01-30 DIAGNOSIS — S90121A Contusion of right lesser toe(s) without damage to nail, initial encounter: Secondary | ICD-10-CM

## 2014-01-30 DIAGNOSIS — Z79899 Other long term (current) drug therapy: Secondary | ICD-10-CM | POA: Insufficient documentation

## 2014-01-30 DIAGNOSIS — Z880 Allergy status to penicillin: Secondary | ICD-10-CM | POA: Insufficient documentation

## 2014-01-30 MED ORDER — IBUPROFEN 800 MG PO TABS
800.0000 mg | ORAL_TABLET | Freq: Once | ORAL | Status: AC
  Administered 2014-01-30: 800 mg via ORAL
  Filled 2014-01-30: qty 1

## 2014-01-30 MED ORDER — HYDROCODONE-ACETAMINOPHEN 5-325 MG PO TABS: 1.0000 | ORAL_TABLET | Freq: Four times a day (QID) | ORAL | Status: DC | PRN

## 2014-01-30 MED ORDER — IBUPROFEN 800 MG PO TABS: 800.0000 mg | ORAL_TABLET | Freq: Three times a day (TID) | ORAL | Status: DC | PRN

## 2014-01-30 NOTE — ED Notes (Signed)
Pt transported to xray 

## 2014-01-30 NOTE — Discharge Instructions (Signed)
Return here as needed. Ice and elevate.

## 2014-01-30 NOTE — ED Notes (Addendum)
Pt reports "stumped right pinky toe" last night and unable to walk today r/t pain to area. Pt reports 10/10 at present time. Pt swelling noted to right pinky toe. Pt able to move toes.

## 2014-01-30 NOTE — ED Provider Notes (Signed)
CSN: [id number]     Arrival date & time 01/30/14  4098 History   First MD Initiated Contact with Patient 01/30/14 0750     Chief Complaint  Patient presents with  . Toe Pain     (Consider location/radiation/quality/duration/timing/severity/associated sxs/prior Treatment) HPI 28 yo black female presents with right toe pain following stubbing her toe on a chair last night. She continued to walk on it throughout the night. She was walking to the bathroom in the middle of the night and felt excruciating pain, 9.5/10 and couldn't walk. Patient describes being able to move toe, feel sensation, and walk with difficulty.   Past Medical History  Diagnosis Date  . Anemia   . Abscess of axilla   . Depression   . Hemorrhoids   . Blood transfusion without reported diagnosis   . Heart murmur   . Substance abuse    History reviewed. No pertinent past surgical history. Family History  Problem Relation Age of Onset  . Hypertension Mother   . Diabetes type II Mother    History  Substance Use Topics  . Smoking status: Current Every Day Smoker -- 0.50 packs/day for 9 years    Types: Cigarettes  . Smokeless tobacco: Not on file  . Alcohol Use: 3.6 oz/week    6 Cans of beer per week   OB History   Grav Para Term Preterm Abortions TAB SAB Ect Mult Living                 Review of Systems  Musculoskeletal: Positive for gait problem and joint swelling.  Skin: Negative for wound.  Neurological: Negative for weakness and numbness.      Allergies  Amoxicillin and Penicillins  Home Medications   Current Outpatient Rx  Name  Route  Sig  Dispense  Refill  . IRON PO   Oral   Take 1 tablet by mouth 2 (two) times daily.         Marland Kitchen senna-docusate (STOOL SOFTENER & LAXATIVE) 8.6-50 MG per tablet   Oral   Take 2 tablets by mouth at bedtime as needed for mild constipation.          BP 104/58  Pulse 85  Temp(Src) 98.2 F (36.8 C) (Oral)  Resp 18  SpO2 100%  LMP 01/28/2014 Physical  Exam  Nursing note and vitals reviewed. Constitutional: She is oriented to person, place, and time. She appears well-developed and well-nourished.  HENT:  Head: Normocephalic and atraumatic.  Pulmonary/Chest: Effort normal.  Musculoskeletal:       Right ankle: Tenderness. Head of 5th metatarsal tenderness found.       Feet:  Neurological: She is alert and oriented to person, place, and time.  Skin: Skin is warm and dry.    ED Course  Procedures (including critical care time) Labs Review Labs Reviewed - No data to display Imaging Review Dg Toe 5th Right  01/30/2014   CLINICAL DATA:  Some stepped on the right little toe last night. Cannot weight bear.  EXAM: RIGHT FIFTH TOE  COMPARISON:  None.  FINDINGS: There is no evidence of fracture or dislocation. There is no evidence of arthropathy or other focal bone abnormality. Soft tissues are unremarkable.  IMPRESSION: Negative.   Electronically Signed   By: Elige Ko   On: 01/30/2014 08:22    Patient is referred to ortho. Told to return here as needed. Ice and elevate the toe. Post op shoe buddy tape and crutches.    Jamesetta Orleans  Maleko Greulich, PA-C 02/02/14 [phone]

## 2014-02-05 ENCOUNTER — Emergency Department (HOSPITAL_COMMUNITY)
Admission: EM | Admit: 2014-02-05 | Discharge: 2014-02-05 | Disposition: A | Discharge status: Home / Self Care | Payer: Medicaid Other | Attending: Emergency Medicine | Admitting: Emergency Medicine

## 2014-02-05 ENCOUNTER — Encounter (HOSPITAL_COMMUNITY): Payer: Self-pay | Admitting: Emergency Medicine

## 2014-02-05 DIAGNOSIS — F17200 Nicotine dependence, unspecified, uncomplicated: Secondary | ICD-10-CM | POA: Insufficient documentation

## 2014-02-05 DIAGNOSIS — D649 Anemia, unspecified: Secondary | ICD-10-CM | POA: Insufficient documentation

## 2014-02-05 DIAGNOSIS — R599 Enlarged lymph nodes, unspecified: Secondary | ICD-10-CM | POA: Insufficient documentation

## 2014-02-05 DIAGNOSIS — N949 Unspecified condition associated with female genital organs and menstrual cycle: Secondary | ICD-10-CM | POA: Insufficient documentation

## 2014-02-05 DIAGNOSIS — Z8679 Personal history of other diseases of the circulatory system: Secondary | ICD-10-CM | POA: Insufficient documentation

## 2014-02-05 DIAGNOSIS — Z8659 Personal history of other mental and behavioral disorders: Secondary | ICD-10-CM | POA: Insufficient documentation

## 2014-02-05 DIAGNOSIS — Z880 Allergy status to penicillin: Secondary | ICD-10-CM | POA: Insufficient documentation

## 2014-02-05 DIAGNOSIS — R197 Diarrhea, unspecified: Secondary | ICD-10-CM | POA: Insufficient documentation

## 2014-02-05 DIAGNOSIS — R590 Localized enlarged lymph nodes: Secondary | ICD-10-CM

## 2014-02-05 DIAGNOSIS — R011 Cardiac murmur, unspecified: Secondary | ICD-10-CM | POA: Insufficient documentation

## 2014-02-05 DIAGNOSIS — Z79899 Other long term (current) drug therapy: Secondary | ICD-10-CM | POA: Insufficient documentation

## 2014-02-05 LAB — WET PREP, GENITAL
Clue Cells Wet Prep HPF POC: NONE SEEN
Trich, Wet Prep: NONE SEEN

## 2014-02-05 NOTE — ED Provider Notes (Signed)
Medical screening examination/treatment/procedure(s) were conducted as a shared visit with non-physician practitioner(s) and myself.  I personally evaluated the patient during the encounter.  Pt with firm nodular mass on left inguinal area on the lateral edge of labia majora.  Not exactly fluctuant, no very tender, no erythema, drainage.  Movable.  No need for I&D, can follow up with GYN as outpt.         Gavin PoundMichael Y. Oletta LamasGhim, MD 02/05/14 [id number]

## 2014-02-05 NOTE — ED Provider Notes (Signed)
CSN: [id number]     Arrival date & time 02/05/14  1323 History   First MD Initiated Contact with Patient 02/05/14 1608     Chief Complaint  Patient presents with  . Abscess     (Consider location/radiation/quality/duration/timing/severity/associated sxs/prior Treatment) HPI Pt is a [age]yo female with hx of abscess of axilla 1 yr ago, c/o "lump" in left groin that has come and gone with her menstrual cycle since Dec. 2014. Pt reports her LMP was 2/10 and she noticed the "lumb" in her groin start to get bigger at the beginning of Feb. States it started as one small bump but now feels like 2, one is hard and there other is more soft. Pt states it is starting to become painful, sharp in nature, worse with palpation, 9/10. Denies discharge from area. Denies dysuria, hematuria, or vaginal discharge. Does reports soft stools she thought was possible mucous but states "it might just be diarrhea about to start." denies blood in stool. Denies fever or chills.   Past Medical History  Diagnosis Date  . Anemia   . Abscess of axilla   . Depression   . Hemorrhoids   . Blood transfusion without reported diagnosis   . Heart murmur   . Substance abuse    History reviewed. No pertinent past surgical history. Family History  Problem Relation Age of Onset  . Hypertension Mother   . Diabetes type II Mother    History  Substance Use Topics  . Smoking status: Current Every Day Smoker -- 0.50 packs/day for 9 years    Types: Cigarettes  . Smokeless tobacco: Not on file  . Alcohol Use: 3.6 oz/week    6 Cans of beer per week   OB History   Grav Para Term Preterm Abortions TAB SAB Ect Mult Living                 Review of Systems  Constitutional: Negative for fever and chills.  Gastrointestinal: Positive for diarrhea ( "soft stool"). Negative for nausea, vomiting, abdominal pain, constipation and blood in stool.  Genitourinary: Positive for genital sores and vaginal pain. Negative for dysuria, urgency,  frequency, hematuria, flank pain, vaginal bleeding, vaginal discharge, menstrual problem and pelvic pain.  All other systems reviewed and are negative.      Allergies  Amoxicillin and Penicillins  Home Medications   Current Outpatient Rx  Name  Route  Sig  Dispense  Refill  . HYDROcodone-acetaminophen (NORCO/VICODIN) 5-325 MG per tablet   Oral   Take 1 tablet by mouth every 6 (six) hours as needed for moderate pain.   10 tablet   0   . ibuprofen (ADVIL,MOTRIN) 800 MG tablet   Oral   Take 1 tablet (800 mg total) by mouth every 8 (eight) hours as needed.   21 tablet   0   . IRON PO   Oral   Take 1 tablet by mouth 2 (two) times daily.         Marland Kitchen. senna-docusate (STOOL SOFTENER & LAXATIVE) 8.6-50 MG per tablet   Oral   Take 2 tablets by mouth at bedtime as needed for mild constipation.          BP 106/61  Pulse 68  Temp(Src) 97.5 F (36.4 C)  Resp 16  SpO2 100%  LMP 01/28/2014 Physical Exam  Nursing note and vitals reviewed. Constitutional: She appears well-developed and well-nourished. No distress.  Pt lying comfortably in exam bed, NAD.   HENT:  Head: Normocephalic and  atraumatic.  Eyes: Conjunctivae are normal. No scleral icterus.  Neck: Normal range of motion.  Cardiovascular: Normal rate, regular rhythm and normal heart sounds.   Pulmonary/Chest: Effort normal and breath sounds normal. No respiratory distress. She has no wheezes. She has no rales. She exhibits no tenderness.  Abdominal: Soft. Bowel sounds are normal. She exhibits no distension and no mass. There is no tenderness. There is no rebound and no guarding.  Soft, non-distended, non-tender. No CVAT  Genitourinary: Vagina normal. There is no rash or tenderness on the right labia. There is tenderness and lesion ( 1cm mobile mass in labia majora near inguinal fold) on the left labia. There is no rash or injury on the left labia. Cervix exhibits no motion tenderness, no discharge and no friability. Right  adnexum displays no mass, no tenderness and no fullness. Left adnexum displays no mass, no tenderness and no fullness. No erythema, tenderness or bleeding around the vagina. No foreign body around the vagina. No signs of injury around the vagina. No vaginal discharge found.  Chaperoned exam. 1cm mobile mass in labia majora near inguinal fold, tender to palpation. No erythema or warmth. No discharge. No mass or tenderness near bartholin cyst.  No vaginal discharge.  Speculum exam: 2 abnormal appearing growths, one small lesion 0.84mm on roof of vaginal wall, second small lesion 0.10mm near cervical os. No CMT, adnexal tenderness or masses.  Musculoskeletal: Normal range of motion.  Neurological: She is alert.  Skin: Skin is warm and dry. She is not diaphoretic.    ED Course  Procedures (including critical care time) Labs Review Labs Reviewed  WET PREP, GENITAL - Abnormal; Notable for the following:    Yeast Wet Prep HPF POC FEW (*)    WBC, Wet Prep HPF POC FEW (*)    All other components within normal limits  GC/CHLAMYDIA PROBE AMP   Imaging Review No results found.  EKG Interpretation   None       MDM   Final diagnoses:  Inguinal lymphadenopathy    Pt presenting with tender mass to left labia major that started in Dec 2014. Mass fluctuates with menstrual cycle. Pt states it appears to go away completely at times.  On exam, 1cm mobile mass present in left labia majora near inguinal fold, appears to be a lymph node.  On vaginal exam: 2 abnormal growths within vaginal canal. Not concerned for emergent process taking place, however, recommended to pt f/u with OB/GYN is highly recommended for possible biopsies of growths.  Reassured pt mass is likely lymph node. No evidence of cellulitis or abscess.  Reassuring mass fluctuates with menstrual cycles, however, still strongly encouraged pt to f/u with OB/GYN for further evaluation. May take acetaminophen and ibuprofen as needed for pain. Return  precautions provided. Pt verbalized understanding and agreement with tx plan.     Junius Finner, PA-C 02/06/14 [phone]

## 2014-02-05 NOTE — ED Notes (Addendum)
Pt reports having abscess to left groin, started in December but has gotten progressively worse. Denies fever/chills. No acute distress noted. Pt also reports cold symptoms and mucous in her stools today.

## 2014-02-05 NOTE — Discharge Instructions (Signed)
Lymphadenopathy °Lymphadenopathy means "disease of the lymph glands." But the term is usually used to describe swollen or enlarged lymph glands, also called lymph nodes. These are the bean-shaped organs found in many locations including the neck, underarm, and groin. Lymph glands are part of the immune system, which fights infections in your body. Lymphadenopathy can occur in just one area of the body, such as the neck, or it can be generalized, with lymph node enlargement in several areas. The nodes found in the neck are the most common sites of lymphadenopathy. °CAUSES  °When your immune system responds to germs (such as viruses or bacteria ), infection-fighting cells and fluid build up. This causes the glands to grow in size. This is usually not something to worry about. Sometimes, the glands themselves can become infected and inflamed. This is called lymphadenitis. °Enlarged lymph nodes can be caused by many diseases: °· Bacterial disease, such as strep throat or a skin infection. °· Viral disease, such as a common cold. °· Other germs, such as lyme disease, tuberculosis, or sexually transmitted diseases. °· Cancers, such as lymphoma (cancer of the lymphatic system) or leukemia (cancer of the white blood cells). °· Inflammatory diseases such as lupus or rheumatoid arthritis. °· Reactions to medications. °Many of the diseases above are rare, but important. This is why you should see your caregiver if you have lymphadenopathy. °SYMPTOMS  °· Swollen, enlarged lumps in the neck, back of the head or other locations. °· Tenderness. °· Warmth or redness of the skin over the lymph nodes. °· Fever. °DIAGNOSIS  °Enlarged lymph nodes are often near the source of infection. They can help healthcare providers diagnose your illness. For instance:  °· Swollen lymph nodes around the jaw might be caused by an infection in the mouth. °· Enlarged glands in the neck often signal a throat infection. °· Lymph nodes that are swollen  in more than one area often indicate an illness caused by a virus. °Your caregiver most likely will know what is causing your lymphadenopathy after listening to your history and examining you. Blood tests, x-rays or other tests may be needed. If the cause of the enlarged lymph node cannot be found, and it does not go away by itself, then a biopsy may be needed. Your caregiver will discuss this with you. °TREATMENT  °Treatment for your enlarged lymph nodes will depend on the cause. Many times the nodes will shrink to normal size by themselves, with no treatment. Antibiotics or other medicines may be needed for infection. Only take over-the-counter or prescription medicines for pain, discomfort or fever as directed by your caregiver. °HOME CARE INSTRUCTIONS  °Swollen lymph glands usually return to normal when the underlying medical condition goes away. If they persist, contact your health-care provider. He/she might prescribe antibiotics or other treatments, depending on the diagnosis. Take any medications exactly as prescribed. Keep any follow-up appointments made to check on the condition of your enlarged nodes.  °SEEK MEDICAL CARE IF:  °· Swelling lasts for more than two weeks. °· You have symptoms such as weight loss, night sweats, fatigue or fever that does not go away. °· The lymph nodes are hard, seem fixed to the skin or are growing rapidly. °· Skin over the lymph nodes is red and inflamed. This could mean there is an infection. °SEEK IMMEDIATE MEDICAL CARE IF:  °· Fluid starts leaking from the area of the enlarged lymph node. °· You develop a fever of 102° F (38.9° C) or greater. °· Severe   pain develops (not necessarily at the site of a large lymph node). °· You develop chest pain or shortness of breath. °· You develop worsening abdominal pain. °MAKE SURE YOU:  °· Understand these instructions. °· Will watch your condition. °· Will get help right away if you are not doing well or get worse. °Document  Released: 09/12/2008 Document Revised: 02/26/2012 Document Reviewed: 09/12/2008 °[employer]® Patient Information ©2014 [employer]. ° °

## 2014-02-06 LAB — GC/CHLAMYDIA PROBE AMP
CT Probe RNA: NEGATIVE
GC Probe RNA: NEGATIVE

## 2014-02-06 NOTE — ED Provider Notes (Signed)
Medical screening examination/treatment/procedure(s) were performed by non-physician practitioner and as supervising physician I was immediately available for consultation/collaboration.  EKG Interpretation   None         Junius ArgyleForrest S Shannon Kirkendall, MD 02/06/14 1100

## 2014-07-11 ENCOUNTER — Encounter (HOSPITAL_COMMUNITY): Payer: Self-pay | Admitting: Emergency Medicine

## 2014-07-11 ENCOUNTER — Emergency Department (HOSPITAL_COMMUNITY)
Admission: EM | Admit: 2014-07-11 | Discharge: 2014-07-11 | Disposition: A | Discharge status: Home / Self Care | Payer: Medicaid Other | Attending: Emergency Medicine | Admitting: Emergency Medicine

## 2014-07-11 DIAGNOSIS — Z8659 Personal history of other mental and behavioral disorders: Secondary | ICD-10-CM | POA: Insufficient documentation

## 2014-07-11 DIAGNOSIS — Z8719 Personal history of other diseases of the digestive system: Secondary | ICD-10-CM | POA: Insufficient documentation

## 2014-07-11 DIAGNOSIS — Z880 Allergy status to penicillin: Secondary | ICD-10-CM | POA: Diagnosis not present

## 2014-07-11 DIAGNOSIS — Z3202 Encounter for pregnancy test, result negative: Secondary | ICD-10-CM | POA: Insufficient documentation

## 2014-07-11 DIAGNOSIS — R011 Cardiac murmur, unspecified: Secondary | ICD-10-CM | POA: Diagnosis not present

## 2014-07-11 DIAGNOSIS — N946 Dysmenorrhea, unspecified: Secondary | ICD-10-CM | POA: Diagnosis not present

## 2014-07-11 DIAGNOSIS — R109 Unspecified abdominal pain: Secondary | ICD-10-CM | POA: Insufficient documentation

## 2014-07-11 DIAGNOSIS — F17200 Nicotine dependence, unspecified, uncomplicated: Secondary | ICD-10-CM | POA: Insufficient documentation

## 2014-07-11 DIAGNOSIS — Z862 Personal history of diseases of the blood and blood-forming organs and certain disorders involving the immune mechanism: Secondary | ICD-10-CM | POA: Insufficient documentation

## 2014-07-11 DIAGNOSIS — Z872 Personal history of diseases of the skin and subcutaneous tissue: Secondary | ICD-10-CM | POA: Diagnosis not present

## 2014-07-11 LAB — HIV ANTIBODY (ROUTINE TESTING W REFLEX): HIV: NONREACTIVE

## 2014-07-11 LAB — URINALYSIS, ROUTINE W REFLEX MICROSCOPIC
Bilirubin Urine: NEGATIVE
Glucose, UA: NEGATIVE mg/dL
Hgb urine dipstick: NEGATIVE
Ketones, ur: NEGATIVE mg/dL
LEUKOCYTES UA: NEGATIVE
Nitrite: NEGATIVE
Protein, ur: NEGATIVE mg/dL
Specific Gravity, Urine: 1.024 (ref 1.005–1.030)
UROBILINOGEN UA: 1 mg/dL (ref 0.0–1.0)
pH: 6.5 (ref 5.0–8.0)

## 2014-07-11 LAB — WET PREP, GENITAL
Clue Cells Wet Prep HPF POC: NONE SEEN
Trich, Wet Prep: NONE SEEN
Yeast Wet Prep HPF POC: NONE SEEN

## 2014-07-11 LAB — PREGNANCY, URINE: PREG TEST UR: NEGATIVE

## 2014-07-11 LAB — RPR

## 2014-07-11 MED ORDER — HYDROMORPHONE HCL PF 1 MG/ML IJ SOLN
1.0000 mg | Freq: Once | INTRAMUSCULAR | Status: AC
  Administered 2014-07-11: 1 mg via INTRAVENOUS
  Filled 2014-07-11: qty 1

## 2014-07-11 MED ORDER — MORPHINE SULFATE 4 MG/ML IJ SOLN: 4.0000 mg | Freq: Once | INTRAMUSCULAR | Status: DC

## 2014-07-11 MED ORDER — ONDANSETRON 4 MG PO TBDP
4.0000 mg | ORAL_TABLET | Freq: Once | ORAL | Status: AC
  Administered 2014-07-11: 4 mg via ORAL
  Filled 2014-07-11: qty 1

## 2014-07-11 MED ORDER — ONDANSETRON HCL 4 MG/2ML IJ SOLN
4.0000 mg | Freq: Once | INTRAMUSCULAR | Status: AC
  Administered 2014-07-11: 4 mg via INTRAVENOUS
  Filled 2014-07-11: qty 2

## 2014-07-11 MED ORDER — NAPROXEN 375 MG PO TABS: 375.0000 mg | ORAL_TABLET | Freq: Two times a day (BID) | ORAL | Status: DC

## 2014-07-11 MED ORDER — OXYCODONE-ACETAMINOPHEN 5-325 MG PO TABS: 1.0000 | ORAL_TABLET | Freq: Four times a day (QID) | ORAL | Status: DC | PRN

## 2014-07-11 MED ORDER — SODIUM CHLORIDE 0.9 % IV SOLN
INTRAVENOUS | Status: DC
  Administered 2014-07-11: 14:00:00 via INTRAVENOUS

## 2014-07-11 MED ORDER — KETOROLAC TROMETHAMINE 30 MG/ML IJ SOLN
30.0000 mg | Freq: Once | INTRAMUSCULAR | Status: AC
  Administered 2014-07-11: 30 mg via INTRAVENOUS
  Filled 2014-07-11: qty 1

## 2014-07-11 MED ORDER — OXYCODONE-ACETAMINOPHEN 5-325 MG PO TABS
1.0000 | ORAL_TABLET | Freq: Once | ORAL | Status: AC
  Administered 2014-07-11: 1 via ORAL
  Filled 2014-07-11: qty 1

## 2014-07-11 MED ORDER — OXYCODONE-ACETAMINOPHEN 5-325 MG PO TABS
2.0000 | ORAL_TABLET | Freq: Once | ORAL | Status: AC
  Administered 2014-07-11: 2 via ORAL
  Filled 2014-07-11: qty 2

## 2014-07-11 MED ORDER — ONDANSETRON HCL 4 MG PO TABS: 4.0000 mg | ORAL_TABLET | Freq: Four times a day (QID) | ORAL | Status: DC

## 2014-07-11 NOTE — Discharge Instructions (Signed)

## 2014-07-11 NOTE — ED Provider Notes (Signed)
CSN: [id number]     Arrival date & time 07/11/14  1205 History   First MD Initiated Contact with Patient 07/11/14 1243     Chief Complaint  Patient presents with  . Abdominal Pain     (Consider location/radiation/quality/duration/timing/severity/associated sxs/prior Treatment) HPI  Patient to the ER with complaints of severe cramping due to menstrual cycle. She reports that this happens every month. Last month and this month has been much more severe than normal. She has had a normal amount of bleeding and without clots. The pain is suprapubic and in her low back. She denies having fevers, nausea, vomiting, diarrhea or discharge. She has not seen a specialist about this yet but her mom recommends she "get a vasectomy" to help stop her periods. PMH of anemia, abscess, depression, substance abuse.  Past Medical History  Diagnosis Date  . Anemia   . Abscess of axilla   . Depression   . Hemorrhoids   . Blood transfusion without reported diagnosis   . Heart murmur   . Substance abuse    History reviewed. No pertinent past surgical history. Family History  Problem Relation Age of Onset  . Hypertension Mother   . Diabetes type II Mother    History  Substance Use Topics  . Smoking status: Current Every Day Smoker -- 0.50 packs/day for 9 years    Types: Cigarettes  . Smokeless tobacco: Not on file  . Alcohol Use: 3.6 oz/week    6 Cans of beer per week   OB History   Grav Para Term Preterm Abortions TAB SAB Ect Mult Living                 Review of Systems   Review of Systems  Gen: no weight loss, fevers, chills, night sweats  Eyes: no discharge or drainage, no occular pain or visual changes  Nose: no epistaxis or rhinorrhea  Mouth: no dental pain, no sore throat  Neck: no neck pain  Lungs:No wheezing or hemoptysis No coughing CV:  No palpitations, dependent edema or orthopnea. No chest pain Abd: no diarrhea. No nausea or vomiting, No abdominal pain  GU: no dysuria or  gross hematuria  + pelvic pain MSK:  No muscle weakness, No  pain Neuro: no headache, no focal neurologic deficits  Skin: no rash , no wounds Psyche: no complaints    Allergies  Amoxicillin and Penicillins  Home Medications   Prior to Admission medications   Medication Sig Start Date End Date Taking? Authorizing Provider  HYDROcodone-acetaminophen (NORCO/VICODIN) 5-325 MG per tablet Take 1 tablet by mouth every 8 (eight) hours as needed for moderate pain.   Yes Historical Provider, MD  naproxen (NAPROSYN) 375 MG tablet Take 1 tablet (375 mg total) by mouth 2 (two) times daily. 07/11/14   Mutasim Tuckey Irine Seal, PA-C  ondansetron (ZOFRAN) 4 MG tablet Take 1 tablet (4 mg total) by mouth every 6 (six) hours. 07/11/14   Dorthula Matas, PA-C  oxyCODONE-acetaminophen (PERCOCET/ROXICET) 5-325 MG per tablet Take 1-2 tablets by mouth every 6 (six) hours as needed. 07/11/14   Letasha Kershaw Irine Seal, PA-C   BP 106/66  Pulse 74  Temp(Src) 97.7 F (36.5 C) (Oral)  Resp 16  SpO2 100% Physical Exam  Nursing note and vitals reviewed. Constitutional: She appears well-developed and well-nourished. No distress.  HENT:  Head: Normocephalic and atraumatic.  Eyes: Pupils are equal, round, and reactive to light.  Neck: Normal range of motion. Neck supple.  Cardiovascular: Normal rate and regular  rhythm.   Pulmonary/Chest: Effort normal.  Abdominal: Soft.  Genitourinary: Uterus normal. Uterus is not enlarged and not tender. Cervix exhibits no motion tenderness, no discharge and no friability. Right adnexum displays no mass, no tenderness and no fullness. Left adnexum displays no mass, no tenderness and no fullness. There is tenderness and bleeding around the vagina. No vaginal discharge found.  Swollen inguinal lymph node, smooth, mobile, non indurated or painful to the touch.  Neurological: She is alert.  Skin: Skin is warm and dry.    ED Course  Procedures (including critical care time) Labs Review Labs  Reviewed  WET PREP, GENITAL - Abnormal; Notable for the following:    WBC, Wet Prep HPF POC RARE (*)    All other components within normal limits  GC/CHLAMYDIA PROBE AMP  URINALYSIS, ROUTINE W REFLEX MICROSCOPIC  PREGNANCY, URINE  HIV ANTIBODY (ROUTINE TESTING)  RPR    Imaging Review No results found.   EKG Interpretation None      MDM   Final diagnoses:  Dysmenorrhea   2: 30 pm Normal pelvic, minimal blood in vaginal vault.  negative urine preg, pain controlled with IV pain medications.  2: 58 pm Patient pain treated in the ED and is negative for any infection ( dry swab was received). She is also not pregnant and has no UTI. [facility], will most likely need to be started on hormone therapy to help regulate cycles.Marland Kitchen. He pelvic exam was norma, her uterus was mildly tender but was not enlarged and smooth.   naproxen (NAPROSYN) 375 MG tablet Take 1 tablet (375 mg total) by mouth 2 (two) times daily. 20 tablet Dorthula Matasiffany G Thalia Turkington, PA-C  ondansetron (ZOFRAN) 4 MG tablet Take 1 tablet (4 mg total) by mouth every 6 (six) hours. 12 tablet Dorthula Matasiffany G Cortavious Nix, PA-C   oxyCODONE-acetaminophen (PERCOCET/ROXICET) 5-325 MG per tablet Take 1-2 tablets by mouth every 6 (six) hours as needed. 20 tablet Dorthula Matasiffany G Fahima Cifelli, PA-C   28 y.o.Rozalynn C Ems's evaluation in the Emergency Department is complete. It has been determined that no acute conditions requiring further emergency intervention are present at this time. The patient/guardian have been advised of the diagnosis and plan. We have discussed signs and symptoms that warrant return to the ED, such as changes or worsening in symptoms.  Vital signs are stable at discharge. Filed Vitals:   07/11/14 1345  BP: 106/66  Pulse: 74  Temp:   Resp: 16    Patient/guardian has voiced understanding and agreed to follow-up with the PCP or specialist.   Dorthula Matasiffany G Akeel Reffner, PA-C 07/11/14 1501

## 2014-07-11 NOTE — ED Notes (Signed)
Pt in c/o severe menstrual cramps, states last month pain during her cycle was severe but this morning it is worse, pt took a vicodin at home with no relief. Also reports a bump on her labia that is worse during her cycle.

## 2014-07-11 NOTE — ED Provider Notes (Signed)
Medical screening examination/treatment/procedure(s) were performed by non-physician practitioner and as supervising physician I was immediately available for consultation/collaboration.   EKG Interpretation None        Coe Angelos W Delicia Berens, MD 07/11/14 1556 

## 2014-07-11 NOTE — ED Notes (Addendum)
Pt having extreme cramps- states her bones in her pelvis feel like they are cracking. Gave pt warm blanket and heat pack

## 2014-07-12 ENCOUNTER — Inpatient Hospital Stay (HOSPITAL_COMMUNITY)
Admission: AD | Admit: 2014-07-12 | Discharge: 2014-07-12 | Disposition: A | Discharge status: Home / Self Care | Payer: Medicaid Other | Source: Ambulatory Visit | Attending: Obstetrics & Gynecology | Admitting: Obstetrics & Gynecology

## 2014-07-12 ENCOUNTER — Inpatient Hospital Stay (HOSPITAL_COMMUNITY): Payer: Medicaid Other

## 2014-07-12 ENCOUNTER — Encounter (HOSPITAL_COMMUNITY): Payer: Self-pay | Admitting: *Deleted

## 2014-07-12 DIAGNOSIS — N809 Endometriosis, unspecified: Secondary | ICD-10-CM | POA: Insufficient documentation

## 2014-07-12 DIAGNOSIS — N946 Dysmenorrhea, unspecified: Secondary | ICD-10-CM | POA: Insufficient documentation

## 2014-07-12 DIAGNOSIS — R109 Unspecified abdominal pain: Secondary | ICD-10-CM | POA: Diagnosis present

## 2014-07-12 DIAGNOSIS — M25559 Pain in unspecified hip: Secondary | ICD-10-CM | POA: Insufficient documentation

## 2014-07-12 DIAGNOSIS — F17200 Nicotine dependence, unspecified, uncomplicated: Secondary | ICD-10-CM | POA: Diagnosis not present

## 2014-07-12 DIAGNOSIS — D649 Anemia, unspecified: Secondary | ICD-10-CM | POA: Diagnosis not present

## 2014-07-12 HISTORY — DX: Headache: R51

## 2014-07-12 HISTORY — DX: Anxiety disorder, unspecified: F41.9

## 2014-07-12 LAB — RAPID URINE DRUG SCREEN, HOSP PERFORMED
AMPHETAMINES: NOT DETECTED
Barbiturates: NOT DETECTED
Benzodiazepines: POSITIVE — AB
Cocaine: NOT DETECTED
OPIATES: NOT DETECTED
TETRAHYDROCANNABINOL: POSITIVE — AB

## 2014-07-12 LAB — CBC
HCT: 25.8 % — ABNORMAL LOW (ref 36.0–46.0)
HEMOGLOBIN: 7.4 g/dL — AB (ref 12.0–15.0)
MCH: 17.2 pg — ABNORMAL LOW (ref 26.0–34.0)
MCHC: 28.7 g/dL — AB (ref 30.0–36.0)
MCV: 59.9 fL — ABNORMAL LOW (ref 78.0–100.0)
Platelets: 324 10*3/uL (ref 150–400)
RBC: 4.31 MIL/uL (ref 3.87–5.11)
RDW: 20.2 % — ABNORMAL HIGH (ref 11.5–15.5)
WBC: 4.8 10*3/uL (ref 4.0–10.5)

## 2014-07-12 MED ORDER — KETOROLAC TROMETHAMINE 60 MG/2ML IM SOLN
60.0000 mg | Freq: Once | INTRAMUSCULAR | Status: AC
  Administered 2014-07-12: 60 mg via INTRAMUSCULAR
  Filled 2014-07-12: qty 2

## 2014-07-12 MED ORDER — NORGESTIMATE-ETH ESTRADIOL 0.25-35 MG-MCG PO TABS: 1.0000 | ORAL_TABLET | Freq: Every day | ORAL | Status: DC

## 2014-07-12 NOTE — MAU Note (Signed)
Was at [facility] yesterday, dx with very mad menstrual cramps.  Have always been bad, but seem to be getting worse.

## 2014-07-12 NOTE — Discharge Instructions (Signed)
Dysmenorrhea °Menstrual cramps (dysmenorrhea) are caused by the muscles of the uterus tightening (contracting) during a menstrual period. For some women, this discomfort is merely bothersome. For others, dysmenorrhea can be severe enough to interfere with everyday activities for a few days each month. °Primary dysmenorrhea is menstrual cramps that last a couple of days when you start having menstrual periods or soon after. This often begins after a teenager starts having her period. As a woman gets older or has a baby, the cramps will usually lessen or disappear. Secondary dysmenorrhea begins later in life, lasts longer, and the pain may be stronger than primary dysmenorrhea. The pain may start before the period and last a few days after the period.  °CAUSES  °Dysmenorrhea is usually caused by an underlying problem, such as: °· The tissue lining the uterus grows outside of the uterus in other areas of the body (endometriosis). °· The endometrial tissue, which normally lines the uterus, is found in or grows into the muscular walls of the uterus (adenomyosis). °· The pelvic blood vessels are engorged with blood just before the menstrual period (pelvic congestive syndrome). °· Overgrowth of cells (polyps) in the lining of the uterus or cervix. °· Falling down of the uterus (prolapse) because of loose or stretched ligaments. °· Depression. °· Bladder problems, infection, or inflammation. °· Problems with the intestine, a tumor, or irritable bowel syndrome. °· Cancer of the female organs or bladder. °· A severely tipped uterus. °· A very tight opening or closed cervix. °· Noncancerous tumors of the uterus (fibroids). °· Pelvic inflammatory disease (PID). °· Pelvic scarring (adhesions) from a previous surgery. °· Ovarian cyst. °· An intrauterine device (IUD) used for birth control. °RISK FACTORS °You may be at greater risk of dysmenorrhea if: °· You are younger than age 30. °· You started puberty early. °· You have  irregular or heavy bleeding. °· You have never given birth. °· You have a family history of this problem. °· You are a smoker. °SIGNS AND SYMPTOMS  °· Cramping or throbbing pain in your lower abdomen. °· Headaches. °· Lower back pain. °· Nausea or vomiting. °· Diarrhea. °· Sweating or dizziness. °· Loose stools. °DIAGNOSIS  °A diagnosis is based on your history, symptoms, physical exam, diagnostic tests, or procedures. Diagnostic tests or procedures may include: °· Blood tests. °· Ultrasonography. °· An examination of the lining of the uterus (dilation and curettage, D&C). °· An examination inside your abdomen or pelvis with a scope (laparoscopy). °· X-rays. °· CT scan. °· MRI. °· An examination inside the bladder with a scope (cystoscopy). °· An examination inside the intestine or stomach with a scope (colonoscopy, gastroscopy). °TREATMENT  °Treatment depends on the cause of the dysmenorrhea. Treatment may include: °· Pain medicine prescribed by your health care provider. °· Birth control pills or an IUD with progesterone hormone in it. °· Hormone replacement therapy. °· Nonsteroidal anti-inflammatory drugs (NSAIDs). These may help stop the production of prostaglandins. °· Surgery to remove adhesions, endometriosis, ovarian cyst, or fibroids. °· Removal of the uterus (hysterectomy). °· Progesterone shots to stop the menstrual period. °· Cutting the nerves on the sacrum that go to the female organs (presacral neurectomy). °· Electric current to the sacral nerves (sacral nerve stimulation). °· Antidepressant medicine. °· Psychiatric therapy, counseling, or group therapy. °· Exercise and physical therapy. °· Meditation and yoga therapy. °· Acupuncture. °HOME CARE INSTRUCTIONS  °· Only take over-the-counter or prescription medicines as directed by your health care provider. °· Place a heating pad   or hot water bottle on your lower back or abdomen. Do not sleep with the heating pad.  Use aerobic exercises, walking,  swimming, biking, and other exercises to help lessen the cramping.  Massage to the lower back or abdomen may help.  Stop smoking.  Avoid alcohol and caffeine. SEEK MEDICAL CARE IF:   Your pain does not get better with medicine.  You have pain with sexual intercourse.  Your pain increases and is not controlled with medicines.  You have abnormal vaginal bleeding with your period.  You develop nausea or vomiting with your period that is not controlled with medicine. SEEK IMMEDIATE MEDICAL CARE IF:  You pass out.  Document Released: 12/04/2005 Document Revised: 08/06/2013 Document Reviewed: 05/22/2013 [facility] Patient Information 2015 [place], [place]. This information is not intended to replace advice given to you by your health care provider. Make sure you discuss any questions you have with your health care provider.  Endometriosis Endometriosis is a condition in which the tissue that lines the uterus (endometrium) grows outside of its normal location. The tissue may grow in many locations close to the uterus, but it commonly grows on the ovaries, fallopian tubes, vagina, or bowel. Because the uterus expels, or sheds, its lining every menstrual cycle, there is bleeding wherever the endometrial tissue is located. This can cause pain because blood is irritating to tissues not normally exposed to it.  CAUSES  The cause of endometriosis is not known.  SIGNS AND SYMPTOMS  Often, there are no symptoms. When symptoms are present, they can vary with the location of the displaced tissue. Various symptoms can occur at different times. Although symptoms occur mainly during a woman's menstrual period, they can also occur midcycle and usually stop with menopause. Some people may go months with no symptoms at all. Symptoms may include:   Back or abdominal pain.   Heavier bleeding during periods.   Pain during intercourse.   Painful bowel movements.   Infertility. DIAGNOSIS  Your health  care provider will do a physical exam and ask about your symptoms. Various tests may be done, such as:   Blood tests and urine tests. These are done to help rule out other problems.   Ultrasound. This test is done to look for abnormal tissue.   An X-ray of the lower bowel (barium enema).  Laparoscopy. In this procedure, a thin, lighted tube with a tiny camera on the end (laparoscope) is inserted into your abdomen. This helps your health care provider look for abnormal tissue to confirm the diagnosis. The health care provider may also remove a small piece of tissue (biopsy) from any abnormal tissue found. This tissue sample can then be sent to a lab so it can be looked at under a microscope. TREATMENT  Treatment will vary and may include:   Medicines to relieve pain. Nonsteroidal anti-inflammatory drugs (NSAIDs) are a type of pain medicine that can help to relieve the pain caused by endometriosis.  Hormonal therapy. When using hormonal therapy, periods are eliminated. This eliminates the monthly exposure to blood by the displaced endometrial tissue.   Surgery. Surgery may sometimes be done to remove the abnormal endometrial tissue. In severe cases, surgery may be done to remove the fallopian tubes, uterus, and ovaries (hysterectomy). HOME CARE INSTRUCTIONS   Take all medicines as directed by your health care provider. Do not take aspirin because it may increase bleeding when you are not on hormonal therapy.   Avoid activities that produce pain, including sexual activity. SEEK  MEDICAL CARE IF:  You have pelvic pain before, after, or during your periods.  You have pelvic pain between periods that gets worse during your period.  You have pelvic pain during or after sex.  You have pelvic pain with bowel movements or urination, especially during your period.  You have problems getting pregnant.  You have a fever. SEEK IMMEDIATE MEDICAL CARE IF:   Your pain is severe and is not  responding to pain medicine.   You have severe nausea and vomiting, or you cannot keep foods down.   You have pain that is limited to the right lower part of your abdomen.   You have swelling or increasing pain in your abdomen.   You see blood in your stool.  MAKE SURE YOU:   Understand these instructions.  Will watch your condition.  Will get help right away if you are not doing well or get worse. Document Released: 12/01/2000 Document Revised: 04/20/2014 Document Reviewed: 08/01/2013 [facility] Patient Information 2015 [place], [place]. This information is not intended to replace advice given to you by your health care provider. Make sure you discuss any questions you have with your health care provider.   Anemia, Nonspecific Anemia is a condition in which the concentration of red blood cells or hemoglobin in the blood is below normal. Hemoglobin is a substance in red blood cells that carries oxygen to the tissues of the body. Anemia results in not enough oxygen reaching these tissues.  CAUSES  Common causes of anemia include:   Excessive bleeding. Bleeding may be internal or external. This includes excessive bleeding from periods (in women) or from the intestine.   Poor nutrition.   Chronic kidney, thyroid, and liver disease.  Bone marrow disorders that decrease red blood cell production.  Cancer and treatments for cancer.  HIV, AIDS, and their treatments.  Spleen problems that increase red blood cell destruction.  Blood disorders.  Excess destruction of red blood cells due to infection, medicines, and autoimmune disorders. SIGNS AND SYMPTOMS   Minor weakness.   Dizziness.   Headache.  Palpitations.   Shortness of breath, especially with exercise.   Paleness.  Cold sensitivity.  Indigestion.  Nausea.  Difficulty sleeping.  Difficulty concentrating. Symptoms may occur suddenly or they may develop slowly.  DIAGNOSIS  Additional blood tests  are often needed. These help your health care provider determine the best treatment. Your health care provider will check your stool for blood and look for other causes of blood loss.  TREATMENT  Treatment varies depending on the cause of the anemia. Treatment can include:   Supplements of iron, vitamin B12, or folic acid.   Hormone medicines.   A blood transfusion. This may be needed if blood loss is severe.   Hospitalization. This may be needed if there is significant continual blood loss.   Dietary changes.  Spleen removal. HOME CARE INSTRUCTIONS Keep all follow-up appointments. It often takes many weeks to correct anemia, and having your health care provider check on your condition and your response to treatment is very important. SEEK IMMEDIATE MEDICAL CARE IF:   You develop extreme weakness, shortness of breath, or chest pain.   You become dizzy or have trouble concentrating.  You develop heavy vaginal bleeding.   You develop a rash.   You have bloody or black, tarry stools.   You faint.   You vomit up blood.   You vomit repeatedly.   You have abdominal pain.  You have a fever or persistent  symptoms for more than 2-3 days.   You have a fever and your symptoms suddenly get worse.   You are dehydrated.  MAKE SURE YOU:  Understand these instructions.  Will watch your condition.  Will get help right away if you are not doing well or get worse. Document Released: 01/11/2005 Document Revised: 08/06/2013 Document Reviewed: 05/30/2013 [facility] Patient Information 2015 [place], [place]. This information is not intended to replace advice given to you by your health care provider. Make sure you discuss any questions you have with your health care provider.  Iron-Rich Diet An iron-rich diet contains foods that are good sources of iron. Iron is an important mineral that helps your body produce hemoglobin. Hemoglobin is a protein in red blood cells that  carries oxygen to the body's tissues. Sometimes, the iron level in your blood can be low. This may be caused by:  A lack of iron in your diet.  Blood loss.  Times of growth, such as during pregnancy or during a child's growth and development. Low levels of iron can cause a decrease in the number of red blood cells. This can result in iron deficiency anemia. Iron deficiency anemia symptoms include:  Tiredness.  Weakness.  Irritability.  Increased chance of infection. Here are some recommendations for daily iron intake:  Males older than 30 years of age need 8 mg of iron per day.  Women ages 52 to 15 need 18 mg of iron per day.  Pregnant women need 27 mg of iron per day, and women who are over 28 years of age and breastfeeding need 9 mg of iron per day.  Women over the age of 78 need 8 mg of iron per day. SOURCES OF IRON There are 2 types of iron that are found in food: heme iron and nonheme iron. Heme iron is absorbed by the body better than nonheme iron. Heme iron is found in meat, poultry, and fish. Nonheme iron is found in grains, beans, and vegetables. Heme Iron Sources Food / Iron (mg)  Chicken liver, 3 oz (85 g)/ 10 mg  Beef liver, 3 oz (85 g)/ 5.5 mg  Oysters, 3 oz (85 g)/ 8 mg  Beef, 3 oz (85 g)/ 2 to 3 mg  Shrimp, 3 oz (85 g)/ 2.8 mg  [place], 3 oz (85 g)/ 2 mg  Chicken, 3 oz (85 g) / 1 mg  Fish (tuna, halibut), 3 oz (85 g)/ 1 mg  Pork, 3 oz (85 g)/ 0.9 mg Nonheme Iron Sources Food / Iron (mg)  Ready-to-eat breakfast cereal, iron-fortified / 3.9 to 7 mg  Tofu,  cup / 3.4 mg  Kidney beans,  cup / 2.6 mg  Baked potato with skin / 2.7 mg  Asparagus,  cup / 2.2 mg  Avocado / 2 mg  Dried peaches,  cup / 1.6 mg  Raisins,  cup / 1.5 mg  Soy milk, 1 cup / 1.5 mg  Whole-wheat bread, 1 slice / 1.2 mg  Spinach, 1 cup / 0.8 mg  Broccoli,  cup / 0.6 mg IRON ABSORPTION Certain foods can decrease the body's absorption of iron. Try to avoid these  foods and beverages while eating meals with iron-containing foods:  Coffee.  Tea.  Fiber.  Soy. Foods containing vitamin C can help increase the amount of iron your body absorbs from iron sources, especially from nonheme sources. Eat foods with vitamin C along with iron-containing foods to increase your iron absorption. Foods that are high in vitamin C include  many fruits and vegetables. Some good sources are:  Fresh orange juice.  Oranges.  Strawberries.  Mangoes.  Grapefruit.  Red bell peppers.  Green bell peppers.  Broccoli.  Potatoes with skin.  Tomato juice. Document Released: 07/18/2005 Document Revised: 02/26/2012 Document Reviewed: 05/25/2011 [facility] Patient Information 2015 [place], [place]. This information is not intended to replace advice given to you by your health care provider. Make sure you discuss any questions you have with your health care provider.

## 2014-07-12 NOTE — MAU Provider Note (Signed)
Chief Complaint: Abdominal Cramping  First Provider Initiated Contact with Patient 07/12/14 1038      SUBJECTIVE HPI: [name] [name] is a 28 y.o. [id number] female who presents with low abdominal, low back and bilateral hip pain. States she's always had this pain with her menstrual periods starting 1 day before cycle and lasting as long as menstrual bleeding last. Seen at [place] yesterday for the pain and states [facility]. Pain has worsened since birth of her last child. States she's never been evaluated by gynecologist for this problem and has not been to a gynecologist in several years due to severe anxiety about female providers and presence of males at the gynecologist's office. Is working with a Veterinary surgeoncounselor on these issues  and knows that she needs to have routine gynecology care.  UA, UPT, wet prep, RPR and HIV negative yesterday. GC/Chlamydia in process. Has not had any ultrasounds for this problem.  Has known chronic anemia. No Work-up. Looking for PCP.    Past Medical History  Diagnosis Date  . Anemia   . Abscess of axilla   . Hemorrhoids   . Blood transfusion without reported diagnosis   . Heart murmur   . Substance abuse   . Headache(784.0)   . Anxiety   . Depression     bipolar   OB History  Gravida Para Term Preterm AB SAB TAB Ectopic Multiple Living  3 2 1 1 1 1  0 0 0 2    # Outcome Date GA Lbr Len/2nd Weight Sex Delivery Anes PTL Lv  3 SAB           2 PRE     F SVD   Y     Comments: baby had gastroscesis  1 TRM     M SVD  N Y     Past Surgical History  Procedure Laterality Date  . No past surgeries     History   Social History  . Marital Status: Single    Spouse Name: N/A    Number of Children: N/A  . Years of Education: N/A   Occupational History  . Not on file.   Social History Main Topics  . Smoking status: Current Every Day Smoker -- 0.50 packs/day for 9 years    Types: Cigarettes  . Smokeless tobacco: Not on file  .  Alcohol Use: Yes  . Drug Use: 5.00 per week    Special: Marijuana     Comment: last 07/24  . Sexual Activity: Yes    Birth Control/ Protection: None   Other Topics Concern  . Not on file   Social History Narrative  . No narrative on file   No current facility-administered medications on file prior to encounter.   Current Outpatient Prescriptions on File Prior to Encounter  Medication Sig Dispense Refill  . HYDROcodone-acetaminophen (NORCO/VICODIN) 5-325 MG per tablet Take 1 tablet by mouth every 8 (eight) hours as needed for moderate pain.      . naproxen (NAPROSYN) 375 MG tablet Take 1 tablet (375 mg total) by mouth 2 (two) times daily.  20 tablet  0  . ondansetron (ZOFRAN) 4 MG tablet Take 1 tablet (4 mg total) by mouth every 6 (six) hours.  12 tablet  0  . oxyCODONE-acetaminophen (PERCOCET/ROXICET) 5-325 MG per tablet Take 1-2 tablets by mouth every 6 (six) hours as needed.  20 tablet  0   Allergies  Allergen Reactions  . Amoxicillin Nausea And Vomiting  .  Penicillins Nausea And Vomiting    ROS: Pertinent positive items in HPI. Negative for fever, chills, vaginal discharge, vaginal odor, nausea, vomiting, diarrhea, dysuria, hematuria, urgency, frequency, flank pain. Has always had mild dyspareunia. Frequent constipation, especially when on iron therapy. Last bowel movement yesterday, loose.  OBJECTIVE Blood pressure 103/58, pulse 64, temperature 98 F (36.7 C), resp. rate 18, last menstrual period 07/09/2014. GENERAL: Well-developed, well-nourished, slender female in mild-mod distress.  HEENT: Normocephalic. Mucus membranes pale HEART: normal rate RESP: normal effort ABDOMEN: Soft, mild, generalized low abdominal tenderness. Negative for rebound tenderness or mass. Positive bowel sounds x4. Negative CVA tenderness. Back nontender, normal range of motion.  EXTREMITIES: Nontender, no edema NEURO: Alert and oriented BIMANUAL: Normal external female genitalia. Mild inguinal  lymphadenopathy. Declined repeat exam.  LAB RESULTS Results for orders placed during the hospital encounter of 07/11/14 (from the past 24 hour(s))  HIV ANTIBODY (ROUTINE TESTING)     Status: None   Collection Time    07/11/14  1:00 PM      Result Value Ref Range   HIV 1&2 Ab, 4th Generation NONREACTIVE  NONREACTIVE  RPR     Status: None   Collection Time    07/11/14  1:00 PM      Result Value Ref Range   RPR NON REAC  NON REAC  URINALYSIS, ROUTINE W REFLEX MICROSCOPIC     Status: None   Collection Time    07/11/14  2:02 PM      Result Value Ref Range   Color, Urine YELLOW  YELLOW   APPearance CLEAR  CLEAR   Specific Gravity, Urine 1.024  1.005 - 1.030   pH 6.5  5.0 - 8.0   Glucose, UA NEGATIVE  NEGATIVE mg/dL   Hgb urine dipstick NEGATIVE  NEGATIVE   Bilirubin Urine NEGATIVE  NEGATIVE   Ketones, ur NEGATIVE  NEGATIVE mg/dL   Protein, ur NEGATIVE  NEGATIVE mg/dL   Urobilinogen, UA 1.0  0.0 - 1.0 mg/dL   Nitrite NEGATIVE  NEGATIVE   Leukocytes, UA NEGATIVE  NEGATIVE  PREGNANCY, URINE     Status: None   Collection Time    07/11/14  2:02 PM      Result Value Ref Range   Preg Test, Ur NEGATIVE  NEGATIVE  WET PREP, GENITAL     Status: Abnormal   Collection Time    07/11/14  2:18 PM      Result Value Ref Range   Yeast Wet Prep HPF POC NONE SEEN  NONE SEEN   Trich, Wet Prep NONE SEEN  NONE SEEN   Clue Cells Wet Prep HPF POC NONE SEEN  NONE SEEN   WBC, Wet Prep HPF POC RARE (*) NONE SEEN    IMAGING US Transvaginal Non-ob  07/12/2014   CLINICAL DATA:  pelvic cramping  EXAM: TRANSABDOMINAL AND TRANSVAGINAL ULTRASOUND OF PELVIS  TECHNIQUE: Study was performed transabdominally to optimize pelvic field of view evaluation and transvaginally to optimize internal visceral architecture evaluation.  COMPARISON:  CT abdomen and pelvis December 12, 2005  FINDINGS: Uterus  Measurements: 8.2 x 5.1 x 6.5 cm. No dominant mass is seen by ultrasound. There are scattered 2-3 mm echogenic foci  which may represent small calcifications due to leiomyomatous change without well-defined mass. Uterus is retroverted.  Endometrium  Thickness: 4 mm.  No focal abnormality visualized.  Right ovary  Measurements: 3.5 x 1.4 x 2.2 cm. Normal appearance/no adnexal mass.  Left ovary  Measurements: 2.6 x 2.8 x 2.2 cm. Normal  appearance/no adnexal mass.  Other findings  There is a small amount of free pelvic fluid.  IMPRESSION: Scattered small calcifications in the uterus which may represent leiomyomatous change. There is no well-defined mass. The overall echotexture of the uterus appears unremarkable. The endometrium is not thickened. Uterus is retroverted.  Small amount of free pelvic fluid may be due to recent ovarian cyst rupture. Note that currently there is no extrauterine pelvic or adnexal mass appreciable.   Electronically Signed   By: Bretta Bang M.D.   On: 07/12/2014 14:36   US Pelvis Complete  07/12/2014   CLINICAL DATA:  pelvic cramping  EXAM: TRANSABDOMINAL AND TRANSVAGINAL ULTRASOUND OF PELVIS  TECHNIQUE: Study was performed transabdominally to optimize pelvic field of view evaluation and transvaginally to optimize internal visceral architecture evaluation.  COMPARISON:  CT abdomen and pelvis December 12, 2005  FINDINGS: Uterus  Measurements: 8.2 x 5.1 x 6.5 cm. No dominant mass is seen by ultrasound. There are scattered 2-3 mm echogenic foci which may represent small calcifications due to leiomyomatous change without well-defined mass. Uterus is retroverted.  Endometrium  Thickness: 4 mm.  No focal abnormality visualized.  Right ovary  Measurements: 3.5 x 1.4 x 2.2 cm. Normal appearance/no adnexal mass.  Left ovary  Measurements: 2.6 x 2.8 x 2.2 cm. Normal appearance/no adnexal mass.  Other findings  There is a small amount of free pelvic fluid.  IMPRESSION: Scattered small calcifications in the uterus which may represent leiomyomatous change. There is no well-defined mass. The overall echotexture  of the uterus appears unremarkable. The endometrium is not thickened. Uterus is retroverted.  Small amount of free pelvic fluid may be due to recent ovarian cyst rupture. Note that currently there is no extrauterine pelvic or adnexal mass appreciable.   Electronically Signed   By: Bretta Bang M.D.   On: 07/12/2014 14:36   MAU COURSE Toradol given.  Patient reports tingling and in toes and vagina after Toradol administration. No other side effects. States she's had these effects in the past without any other complications. Pain significantly improved. Patient relaxed and smiling.  ASSESSMENT 1. Dysmenorrhea   2. Endometriosis   3. Chronic anemia    PLAN Discharge home in stable condition. Take naproxen or ibuprofen on schedule starting 24 hours prior to onset of menses and throughout cycle. Use Percocet sparingly as needed for breakthrough pain. Discussed habit-forming and constipating side effects of narcotics. Start OCPs for presumed endometriosis. Emphasize that patient cannot have chronic pelvic pain problems managed through emergency room settings and will need to be followed by gynecologist.  Patient would like to go to [facility]  family medicine for primary care and for continued management of OCPs. Will get referral to gynecologist as needed if this is not effective. Follow-up Information   Follow up with Gynecologist. (For management of chronic pelvic pain and routine gynecology care)       Follow up with [facility]. (As needed in emergencies)    Contact information:   [address] [place] [address]       Follow up with [facility]. (For primary care, anemia workup and a OCP management)    Contact information:   [address] [place] [address] [phone]       Medication List    ASK your doctor about these medications       HYDROcodone-acetaminophen 5-325 MG per tablet  Commonly known as:  NORCO/VICODIN  Take 1 tablet by  mouth every  8 (eight) hours as needed for moderate pain.     naproxen 375 MG tablet  Commonly known as:  NAPROSYN  Take 1 tablet (375 mg total) by mouth 2 (two) times daily.     ondansetron 4 MG tablet  Commonly known as:  ZOFRAN  Take 1 tablet (4 mg total) by mouth every 6 (six) hours.     oxyCODONE-acetaminophen 5-325 MG per tablet  Commonly known as:  PERCOCET/ROXICET  Take 1-2 tablets by mouth every 6 (six) hours as needed.        [place], [place] 07/12/2014  3:41 PM

## 2014-07-12 NOTE — MAU Note (Signed)
Pt presents to MAU with complaints of cramping in her lower abdomen from her menstrual cycle. Cycle started Thursday night. [facility] and referred here.

## 2014-07-13 LAB — GC/CHLAMYDIA PROBE AMP
CT Probe RNA: NEGATIVE
CT Probe RNA: NEGATIVE
GC Probe RNA: NEGATIVE
GC Probe RNA: NEGATIVE

## 2014-08-09 ENCOUNTER — Other Ambulatory Visit: Payer: Self-pay | Admitting: Advanced Practice Midwife

## 2014-10-19 ENCOUNTER — Encounter (HOSPITAL_COMMUNITY): Payer: Self-pay | Admitting: *Deleted

## 2014-11-04 ENCOUNTER — Inpatient Hospital Stay (HOSPITAL_COMMUNITY)
Admission: AD | Admit: 2014-11-04 | Discharge: 2014-11-04 | Disposition: A | Discharge status: Home / Self Care | Payer: Medicaid Other | Source: Ambulatory Visit | Attending: Family Medicine | Admitting: Family Medicine

## 2014-11-04 ENCOUNTER — Encounter (HOSPITAL_COMMUNITY): Payer: Self-pay | Admitting: *Deleted

## 2014-11-04 DIAGNOSIS — Z880 Allergy status to penicillin: Secondary | ICD-10-CM | POA: Diagnosis not present

## 2014-11-04 DIAGNOSIS — F17210 Nicotine dependence, cigarettes, uncomplicated: Secondary | ICD-10-CM | POA: Insufficient documentation

## 2014-11-04 DIAGNOSIS — N907 Vulvar cyst: Secondary | ICD-10-CM | POA: Diagnosis not present

## 2014-11-04 DIAGNOSIS — Z202 Contact with and (suspected) exposure to infections with a predominantly sexual mode of transmission: Secondary | ICD-10-CM | POA: Diagnosis not present

## 2014-11-04 DIAGNOSIS — B373 Candidiasis of vulva and vagina: Secondary | ICD-10-CM | POA: Diagnosis not present

## 2014-11-04 DIAGNOSIS — B3731 Acute candidiasis of vulva and vagina: Secondary | ICD-10-CM

## 2014-11-04 DIAGNOSIS — R109 Unspecified abdominal pain: Secondary | ICD-10-CM | POA: Diagnosis present

## 2014-11-04 LAB — WET PREP, GENITAL
Clue Cells Wet Prep HPF POC: NONE SEEN
Trich, Wet Prep: NONE SEEN

## 2014-11-04 LAB — URINALYSIS, ROUTINE W REFLEX MICROSCOPIC
GLUCOSE, UA: NEGATIVE mg/dL
Hgb urine dipstick: NEGATIVE
Ketones, ur: 15 mg/dL — AB
LEUKOCYTES UA: NEGATIVE
NITRITE: NEGATIVE
PH: 5.5 (ref 5.0–8.0)
Protein, ur: NEGATIVE mg/dL
Specific Gravity, Urine: >1.03 — ABNORMAL HIGH (ref 1.005–1.030)
Urobilinogen, UA: 0.2 mg/dL (ref 0.0–1.0)

## 2014-11-04 LAB — POCT PREGNANCY, URINE: PREG TEST UR: NEGATIVE

## 2014-11-04 LAB — HIV ANTIBODY (ROUTINE TESTING W REFLEX): HIV 1&2 Ab, 4th Generation: NONREACTIVE

## 2014-11-04 MED ORDER — FLUCONAZOLE 150 MG PO TABS: 150.0000 mg | ORAL_TABLET | Freq: Once | ORAL | Status: DC

## 2014-11-04 NOTE — MAU Note (Signed)
No problems, just wants to be tested.  Stomach has been feeling crazy (a wk), and urine has an odor.

## 2014-11-04 NOTE — Discharge Instructions (Signed)

## 2014-11-04 NOTE — MAU Note (Signed)
To lobby to wait on results.

## 2014-11-04 NOTE — MAU Provider Note (Signed)
History     CSN: [id number]  Arrival date and time: 11/04/14 1008   First Provider Initiated Contact with Patient 11/04/14 1112      Chief Complaint  Patient presents with  . Abdominal Pain   HPI Patient seen for concerns of STD exposure. She is in a nonexclusive female/female relationship. She denies symptoms of discharge, pain, fevers, chills nausea, vomiting. Her urine does have a strong odor. Her partner was being tested and she thought she should be tested as well.   OB History    Gravida Para Term Preterm AB TAB SAB Ectopic Multiple Living   3 2 1 1 1  0 1 0 0 2      Past Medical History  Diagnosis Date  . Anemia   . Abscess of axilla   . Hemorrhoids   . Blood transfusion without reported diagnosis   . Heart murmur   . Substance abuse   . Headache(784.0)   . Anxiety   . Depression     bipolar; not on meds    Past Surgical History  Procedure Laterality Date  . No past surgeries      Family History  Problem Relation Age of Onset  . Hypertension Mother   . Diabetes type II Mother   . Arthritis Mother   . Hyperlipidemia Mother   . Cancer Father     lung    History  Substance Use Topics  . Smoking status: Current Every Day Smoker -- 0.50 packs/day for 9 years    Types: Cigarettes  . Smokeless tobacco: Never Used  . Alcohol Use: Yes     Comment: rare    Allergies:  Allergies  Allergen Reactions  . Amoxicillin Nausea And Vomiting  . Penicillins Nausea And Vomiting    Prescriptions prior to admission  Medication Sig Dispense Refill Last Dose  . naproxen (NAPROSYN) 375 MG tablet Take 1 tablet (375 mg total) by mouth 2 (two) times daily. 20 tablet 0 Past Week at Unknown time  . norgestimate-ethinyl estradiol (ORTHO-CYCLEN,SPRINTEC,PREVIFEM) 0.25-35 MG-MCG tablet Take 1 tablet by mouth daily. 1 Package 2 11/04/2014 at Unknown time  . HYDROcodone-acetaminophen (NORCO/VICODIN) 5-325 MG per tablet Take 1 tablet by mouth every 8 (eight) hours as needed  for moderate pain.   Past Week at Unknown time  . ondansetron (ZOFRAN) 4 MG tablet Take 1 tablet (4 mg total) by mouth every 6 (six) hours. (Patient not taking: Reported on 11/04/2014) 12 tablet 0 new rx  . oxyCODONE-acetaminophen (PERCOCET/ROXICET) 5-325 MG per tablet Take 1-2 tablets by mouth every 6 (six) hours as needed. (Patient not taking: Reported on 11/04/2014) 20 tablet 0 new rx    Review of Systems  Constitutional: Negative for fever, chills and weight loss.  Respiratory: Negative for sputum production, shortness of breath and wheezing.   Cardiovascular: Negative for chest pain, palpitations and leg swelling.  Gastrointestinal: Negative for nausea, vomiting, abdominal pain and diarrhea.  Genitourinary: Negative for dysuria, urgency, frequency, hematuria and flank pain.  Neurological: Negative for weakness and headaches.   Physical Exam   Blood pressure 106/60, pulse 79, temperature 99.1 F (37.3 C), temperature source Oral, resp. rate 16, height 5' 6.5" (1.689 m), weight 127 lb (57.607 kg), last menstrual period 10/24/2014.  Physical Exam  Constitutional: She appears well-developed and well-nourished.  HENT:  Head: Normocephalic and atraumatic.  Cardiovascular: Normal rate and regular rhythm.  Exam reveals no gallop and no friction rub.   No murmur heard. Respiratory: Effort normal and breath sounds normal.  No respiratory distress. She has no wheezes. She has no rales. She exhibits no tenderness.  GI: Soft. Bowel sounds are normal. She exhibits no distension and no mass. There is no tenderness. There is no rebound and no guarding.  Genitourinary:    There is no rash, tenderness, lesion or injury on the right labia. There is no rash, tenderness, lesion or injury on the left labia. Uterus is not deviated, not enlarged, not fixed and not tender. Cervix exhibits no discharge and no friability. Right adnexum displays no mass, no tenderness and no fullness. Left adnexum displays no  mass, no tenderness and no fullness. No erythema, tenderness or bleeding in the vagina. No foreign body around the vagina. No signs of injury around the vagina. No vaginal discharge found.  Skin: Skin is warm and dry. No rash noted. No erythema. No pallor.  Psychiatric: She has a normal mood and affect. Her behavior is normal. Judgment and thought content normal.    MAU Course  Procedures  MDM GC/CT, HIV, and wet prep obtained. Wet prep showed yeast.  Assessment and Plan   Problem List Items Addressed This Visit    None    Visit Diagnoses    STD exposure    -  Primary    Relevant Orders       Discharge patient    Inclusion cyst of vulva        Relevant Orders       Discharge patient    Yeast vaginitis        Relevant Medications       fluconazole (DIFLUCAN) tablet 150 mg    Other Relevant Orders       Discharge patient      HIV, GC/CT pending. Education given.  STINSON, JACOB JEHIEL 11/04/2014, 11:24 AM

## 2014-11-05 LAB — GC/CHLAMYDIA PROBE AMP
CT Probe RNA: NEGATIVE
GC PROBE AMP APTIMA: NEGATIVE

## 2015-03-18 ENCOUNTER — Emergency Department (HOSPITAL_COMMUNITY)
Admission: EM | Admit: 2015-03-18 | Discharge: 2015-03-18 | Disposition: A | Discharge status: Home / Self Care | Payer: Medicaid Other | Attending: Emergency Medicine | Admitting: Emergency Medicine

## 2015-03-18 ENCOUNTER — Encounter (HOSPITAL_COMMUNITY): Payer: Self-pay | Admitting: Emergency Medicine

## 2015-03-18 DIAGNOSIS — Y929 Unspecified place or not applicable: Secondary | ICD-10-CM | POA: Diagnosis not present

## 2015-03-18 DIAGNOSIS — S4992XA Unspecified injury of left shoulder and upper arm, initial encounter: Secondary | ICD-10-CM | POA: Insufficient documentation

## 2015-03-18 DIAGNOSIS — Z8719 Personal history of other diseases of the digestive system: Secondary | ICD-10-CM | POA: Diagnosis not present

## 2015-03-18 DIAGNOSIS — Z720 Tobacco use: Secondary | ICD-10-CM | POA: Insufficient documentation

## 2015-03-18 DIAGNOSIS — Z793 Long term (current) use of hormonal contraceptives: Secondary | ICD-10-CM | POA: Diagnosis not present

## 2015-03-18 DIAGNOSIS — Z79899 Other long term (current) drug therapy: Secondary | ICD-10-CM | POA: Diagnosis not present

## 2015-03-18 DIAGNOSIS — Y939 Activity, unspecified: Secondary | ICD-10-CM | POA: Insufficient documentation

## 2015-03-18 DIAGNOSIS — X58XXXA Exposure to other specified factors, initial encounter: Secondary | ICD-10-CM | POA: Diagnosis not present

## 2015-03-18 DIAGNOSIS — Z880 Allergy status to penicillin: Secondary | ICD-10-CM | POA: Diagnosis not present

## 2015-03-18 DIAGNOSIS — Z8659 Personal history of other mental and behavioral disorders: Secondary | ICD-10-CM | POA: Diagnosis not present

## 2015-03-18 DIAGNOSIS — R011 Cardiac murmur, unspecified: Secondary | ICD-10-CM | POA: Insufficient documentation

## 2015-03-18 DIAGNOSIS — Y999 Unspecified external cause status: Secondary | ICD-10-CM | POA: Diagnosis not present

## 2015-03-18 DIAGNOSIS — Z862 Personal history of diseases of the blood and blood-forming organs and certain disorders involving the immune mechanism: Secondary | ICD-10-CM | POA: Diagnosis not present

## 2015-03-18 DIAGNOSIS — Z872 Personal history of diseases of the skin and subcutaneous tissue: Secondary | ICD-10-CM | POA: Diagnosis not present

## 2015-03-18 DIAGNOSIS — M25512 Pain in left shoulder: Secondary | ICD-10-CM

## 2015-03-18 MED ORDER — MELOXICAM 15 MG PO TABS: 15.0000 mg | ORAL_TABLET | Freq: Every day | ORAL | Status: DC

## 2015-03-18 MED ORDER — CYCLOBENZAPRINE HCL 10 MG PO TABS: 10.0000 mg | ORAL_TABLET | Freq: Two times a day (BID) | ORAL | Status: DC | PRN

## 2015-03-18 NOTE — ED Notes (Signed)
Pt reporting left shoulder oain for "a while", worse the past few days. Denies injury. Pt reports left hand gets cold sometimes. NAD noted.

## 2015-03-18 NOTE — ED Provider Notes (Signed)
CSN: [id number]     Arrival date & time 03/18/15  1256 History  This chart was scribed for non-physician practitioner, Emilia Beck, PA-C, working with Eber Hong, MD, by Abel Presto, ED Scribe. This patient was seen in room TR08C/TR08C and the patient's care was started at 1:39 PM.    Chief Complaint  Patient presents with  . Shoulder Pain    Patient is a 29 y.o. female presenting with shoulder pain. The history is provided by the patient. No language interpreter was used.  Shoulder Pain  HPI Comments: [name] [name] is a 29 y.o. female who presents to the Emergency Department complaining of left shoulder pain with onset 1 week ago. Pt notes limited ROM due to pain.  Pt notes she has felt a popping sensation in her shoulder until the past 2 days. She notes pain radiates to her forearm. Pt took Advil, used [employer], and Vicodin for relief. Pt reports arm feels intermittently cold and hot. Pt denies any known injury or other complaints at this time.   Past Medical History  Diagnosis Date  . Anemia   . Abscess of axilla   . Hemorrhoids   . Blood transfusion without reported diagnosis   . Heart murmur   . Substance abuse   . Headache(784.0)   . Anxiety   . Depression     bipolar; not on meds   Past Surgical History  Procedure Laterality Date  . No past surgeries     Family History  Problem Relation Age of Onset  . Hypertension Mother   . Diabetes type II Mother   . Arthritis Mother   . Hyperlipidemia Mother   . Cancer Father     lung   History  Substance Use Topics  . Smoking status: Current Every Day Smoker -- 0.50 packs/day for 9 years    Types: Cigarettes  . Smokeless tobacco: Never Used  . Alcohol Use: Yes     Comment: rare   OB History    Gravida Para Term Preterm AB TAB SAB Ectopic Multiple Living   0 1 0 0 2     Review of Systems  Musculoskeletal: Positive for myalgias and arthralgias.  All other systems reviewed and are  negative.     Allergies  Amoxicillin and Penicillins  Home Medications   Prior to Admission medications   Medication Sig Start Date End Date Taking? Authorizing Provider  fluconazole (DIFLUCAN) 150 MG tablet Take 1 tablet (150 mg total) by mouth once. 11/04/14   [name], DO  naproxen (NAPROSYN) 375 MG tablet Take 1 tablet (375 mg total) by mouth 2 (two) times daily. 07/11/14   Marlon Pel, PA-C  norgestimate-ethinyl estradiol (ORTHO-CYCLEN,SPRINTEC,PREVIFEM) 0.25-35 MG-MCG tablet Take 1 tablet by mouth daily. 07/12/14   Koleen Nimrod Smith, CNM   BP 106/63 mmHg  Pulse 64  Temp(Src) 98.2 F (36.8 C)  Resp 15  Ht 5' 6.5" (1.689 m)  Wt 130 lb (58.968 kg)  BMI 20.67 kg/m2  SpO2 100%  LMP 02/16/2015 Physical Exam  Constitutional: She is oriented to person, place, and time. She appears well-developed and well-nourished. No distress.  HENT:  Head: Normocephalic and atraumatic.  Eyes: Conjunctivae are normal.  Neck: Normal range of motion. Neck supple.  Cardiovascular: Normal rate, regular rhythm and intact distal pulses.  Exam reveals no gallop and no friction rub.   No murmur heard. Pulmonary/Chest: Effort normal and breath sounds normal. She has no wheezes. She has no  rales. She exhibits no tenderness.  Abdominal: Soft. There is no tenderness.  Musculoskeletal:       Left shoulder: She exhibits normal range of motion and no tenderness.  Limited ROM of left shoulder due to pain. Posterior left shoulder tenderness to palpation.   Neurological: She is alert and oriented to person, place, and time.  Speech is goal-oriented. Moves limbs without ataxia.   Skin: Skin is warm and dry.  Psychiatric: She has a normal mood and affect. Her behavior is normal.  Nursing note and vitals reviewed.   ED Course  Procedures (including critical care time) DIAGNOSTIC STUDIES: Oxygen Saturation is 100% on room air, normal by my interpretation.    COORDINATION OF CARE: 1:43 PM Discussed  treatment plan with patient at beside, the patient agrees with the plan and has no further questions at this time.   Labs Review Labs Reviewed - No data to display  SPLINT APPLICATION Date/Time: 1:59 PM Authorized by: Emilia BeckKaitlyn Ardon Franklin Consent: Verbal consent obtained. Risks and benefits: risks, benefits and alternatives were discussed Consent given by: patient Splint applied by: orthopedic technician Location details: left shoulder Splint type: arm sling Supplies used: arm sling Post-procedure: The splinted body part was neurovascularly unchanged following the procedure. Patient tolerance: Patient tolerated the procedure well with no immediate complications.     Imaging Review No results found.   EKG Interpretation None      MDM   Final diagnoses:  Left shoulder pain   Patient likely has muscle strain. Patient will have sling and referral to Ortho. No neurovascular compromise.   I personally performed the services described in this documentation, which was scribed in my presence. The recorded information has been reviewed and is accurate.     Emilia BeckKaitlyn Lema Heinkel, PA-C 03/18/15 1528  Eber HongBrian Miller, MD 03/18/15 [phone]

## 2015-03-18 NOTE — Discharge Instructions (Signed)
Take Mobic as needed for pain. Take flexeril as needed for muscle spasm. Refer to attached documents for more information. Follow up with Dr. Victorino DikeHewitt as needed for further evaluation.

## 2015-06-11 ENCOUNTER — Encounter (HOSPITAL_COMMUNITY): Payer: Self-pay | Admitting: Emergency Medicine

## 2015-06-11 ENCOUNTER — Emergency Department (HOSPITAL_COMMUNITY)
Admission: EM | Admit: 2015-06-11 | Discharge: 2015-06-11 | Disposition: A | Discharge status: Home / Self Care | Payer: Medicaid Other | Attending: Emergency Medicine | Admitting: Emergency Medicine

## 2015-06-11 DIAGNOSIS — Z3202 Encounter for pregnancy test, result negative: Secondary | ICD-10-CM | POA: Diagnosis not present

## 2015-06-11 DIAGNOSIS — R011 Cardiac murmur, unspecified: Secondary | ICD-10-CM | POA: Insufficient documentation

## 2015-06-11 DIAGNOSIS — R197 Diarrhea, unspecified: Secondary | ICD-10-CM | POA: Insufficient documentation

## 2015-06-11 DIAGNOSIS — Z791 Long term (current) use of non-steroidal anti-inflammatories (NSAID): Secondary | ICD-10-CM | POA: Insufficient documentation

## 2015-06-11 DIAGNOSIS — Z880 Allergy status to penicillin: Secondary | ICD-10-CM | POA: Diagnosis not present

## 2015-06-11 DIAGNOSIS — Z720 Tobacco use: Secondary | ICD-10-CM | POA: Diagnosis not present

## 2015-06-11 DIAGNOSIS — D649 Anemia, unspecified: Secondary | ICD-10-CM | POA: Diagnosis not present

## 2015-06-11 DIAGNOSIS — Z8659 Personal history of other mental and behavioral disorders: Secondary | ICD-10-CM | POA: Diagnosis not present

## 2015-06-11 DIAGNOSIS — Z872 Personal history of diseases of the skin and subcutaneous tissue: Secondary | ICD-10-CM | POA: Insufficient documentation

## 2015-06-11 DIAGNOSIS — R112 Nausea with vomiting, unspecified: Secondary | ICD-10-CM | POA: Diagnosis not present

## 2015-06-11 LAB — URINE MICROSCOPIC-ADD ON

## 2015-06-11 LAB — COMPREHENSIVE METABOLIC PANEL
ALT: 15 U/L (ref 14–54)
AST: 18 U/L (ref 15–41)
Albumin: 3.3 g/dL — ABNORMAL LOW (ref 3.5–5.0)
Alkaline Phosphatase: 38 U/L (ref 38–126)
Anion gap: 7 (ref 5–15)
BUN: 7 mg/dL (ref 6–20)
CO2: 24 mmol/L (ref 22–32)
Calcium: 9 mg/dL (ref 8.9–10.3)
Chloride: 106 mmol/L (ref 101–111)
Creatinine, Ser: 0.64 mg/dL (ref 0.44–1.00)
GFR calc Af Amer: >60 mL/min (ref >60)
GFR calc non Af Amer: >60 mL/min (ref >60)
Glucose, Bld: 93 mg/dL (ref 65–99)
Potassium: 3.6 mmol/L (ref 3.5–5.1)
Sodium: 137 mmol/L (ref 135–145)
Total Bilirubin: 0.5 mg/dL (ref 0.3–1.2)
Total Protein: 6.4 g/dL — ABNORMAL LOW (ref 6.5–8.1)

## 2015-06-11 LAB — URINALYSIS, ROUTINE W REFLEX MICROSCOPIC
Glucose, UA: NEGATIVE mg/dL
Hgb urine dipstick: NEGATIVE
Ketones, ur: 40 mg/dL — AB
Nitrite: NEGATIVE
Protein, ur: NEGATIVE mg/dL
Specific Gravity, Urine: 1.03 (ref 1.005–1.030)
Urobilinogen, UA: 1 mg/dL (ref 0.0–1.0)
pH: 6.5 (ref 5.0–8.0)

## 2015-06-11 LAB — CBC WITH DIFFERENTIAL/PLATELET
Basophils Absolute: 0 10*3/uL (ref 0.0–0.1)
Basophils Relative: 0 % (ref 0–1)
Eosinophils Absolute: 0 10*3/uL (ref 0.0–0.7)
Eosinophils Relative: 1 % (ref 0–5)
HCT: 28 % — ABNORMAL LOW (ref 36.0–46.0)
Hemoglobin: 8.1 g/dL — ABNORMAL LOW (ref 12.0–15.0)
Lymphocytes Relative: 23 % (ref 12–46)
Lymphs Abs: 1.1 10*3/uL (ref 0.7–4.0)
MCH: 17.5 pg — ABNORMAL LOW (ref 26.0–34.0)
MCHC: 28.9 g/dL — ABNORMAL LOW (ref 30.0–36.0)
MCV: 60.5 fL — ABNORMAL LOW (ref 78.0–100.0)
Monocytes Absolute: 0.4 10*3/uL (ref 0.1–1.0)
Monocytes Relative: 8 % (ref 3–12)
Neutro Abs: 3.3 10*3/uL (ref 1.7–7.7)
Neutrophils Relative %: 68 % (ref 43–77)
Platelets: 367 10*3/uL (ref 150–400)
RBC: 4.63 MIL/uL (ref 3.87–5.11)
RDW: 19.6 % — ABNORMAL HIGH (ref 11.5–15.5)
WBC: 4.8 10*3/uL (ref 4.0–10.5)

## 2015-06-11 LAB — POC URINE PREG, ED: Preg Test, Ur: NEGATIVE

## 2015-06-11 LAB — LIPASE, BLOOD: Lipase: 13 U/L — ABNORMAL LOW (ref 22–51)

## 2015-06-11 MED ORDER — MORPHINE SULFATE 4 MG/ML IJ SOLN: 4.0000 mg | Freq: Once | INTRAMUSCULAR | Status: DC

## 2015-06-11 MED ORDER — ONDANSETRON HCL 4 MG PO TABS: 4.0000 mg | ORAL_TABLET | Freq: Three times a day (TID) | ORAL | Status: DC | PRN

## 2015-06-11 MED ORDER — ACETAMINOPHEN 325 MG PO TABS
650.0000 mg | ORAL_TABLET | Freq: Once | ORAL | Status: AC
  Administered 2015-06-11: 650 mg via ORAL
  Filled 2015-06-11: qty 2

## 2015-06-11 MED ORDER — SODIUM CHLORIDE 0.9 % IV BOLUS (SEPSIS)
1000.0000 mL | Freq: Once | INTRAVENOUS | Status: AC
  Administered 2015-06-11: 1000 mL via INTRAVENOUS

## 2015-06-11 MED ORDER — ONDANSETRON HCL 4 MG/2ML IJ SOLN
4.0000 mg | Freq: Once | INTRAMUSCULAR | Status: AC
  Administered 2015-06-11: 4 mg via INTRAVENOUS
  Filled 2015-06-11: qty 2

## 2015-06-11 MED ORDER — NITROFURANTOIN MONOHYD MACRO 100 MG PO CAPS: 100.0000 mg | ORAL_CAPSULE | Freq: Two times a day (BID) | ORAL | Status: DC

## 2015-06-11 NOTE — ED Notes (Signed)
Pt. Stated, I 've been sick since wed. With vomiting and diarrhea

## 2015-06-11 NOTE — ED Notes (Signed)
Patient given sprite and tolerating oral fluids at this time.

## 2015-06-11 NOTE — ED Notes (Signed)
Patient states in the past month she has had 2-3 times per week "black out periods" that last for 10-15 seconds and the dizziness follows.  Patient states she has not fallen during any of these periods.  PA Laveda Norman made aware.

## 2015-06-11 NOTE — Discharge Instructions (Signed)

## 2015-06-11 NOTE — ED Provider Notes (Signed)
CSN: [id number]     Arrival date & time 06/11/15  0034 History   First MD Initiated Contact with Patient 06/11/15 1027     Chief Complaint  Patient presents with  . Emesis  . Diarrhea     (Consider location/radiation/quality/duration/timing/severity/associated sxs/prior Treatment) HPI   29 year old female with history of substance abuse, hemorrhoid, anxiety, anemia presenting for evaluation of nausea vomiting diarrhea. Patient reports for the past 3 days she has been feeling sick. Symptoms started shortly after she was hanging out with a friend and drink a small amount of mixed drinks. States she came home shortly after developing chills, sweats, abdominal cramping follows with nausea vomiting diarrhea. She has been vomiting every time she eats or drink. She also endorses having loose stools recently with trace of blood from wiping. She feels tired, and weak. She went to work today but was sent home. No complaints of fever, severe headache, chest pain, productive cough, dysuria, hematuria, vaginal bleeding or vaginal discharge. No recent antibiotic use. At home she tries taking bath with minimal improvement. No recent travel or eating exotic food.  Past Medical History  Diagnosis Date  . Anemia   . Abscess of axilla   . Hemorrhoids   . Blood transfusion without reported diagnosis   . Heart murmur   . Substance abuse   . Headache(784.0)   . Anxiety   . Depression     bipolar; not on meds   Past Surgical History  Procedure Laterality Date  . No past surgeries     Family History  Problem Relation Age of Onset  . Hypertension Mother   . Diabetes type II Mother   . Arthritis Mother   . Hyperlipidemia Mother   . Cancer Father     lung   History  Substance Use Topics  . Smoking status: Current Every Day Smoker -- 0.50 packs/day for 9 years    Types: Cigarettes  . Smokeless tobacco: Never Used  . Alcohol Use: Yes     Comment: rare   OB History    Gravida Para Term Preterm AB  TAB SAB Ectopic Multiple Living   3 2 1 1 1  0 1 0 0 2     Review of Systems  All other systems reviewed and are negative.     Allergies  Amoxicillin and Penicillins  Home Medications   Prior to Admission medications   Medication Sig Start Date End Date Taking? Authorizing Provider  cyclobenzaprine (FLEXERIL) 10 MG tablet Take 1 tablet (10 mg total) by mouth 2 (two) times daily as needed for muscle spasms. 03/18/15   Kaitlyn Szekalski, PA-C  fluconazole (DIFLUCAN) 150 MG tablet Take 1 tablet (150 mg total) by mouth once. 11/04/14   Levie Heritage, DO  meloxicam (MOBIC) 15 MG tablet Take 1 tablet (15 mg total) by mouth daily. 03/18/15   Kaitlyn Szekalski, PA-C  naproxen (NAPROSYN) 375 MG tablet Take 1 tablet (375 mg total) by mouth 2 (two) times daily. 07/11/14   Marlon Pel, PA-C  norgestimate-ethinyl estradiol (ORTHO-CYCLEN,SPRINTEC,PREVIFEM) 0.25-35 MG-MCG tablet Take 1 tablet by mouth daily. 07/12/14   Koleen Nimrod Smith, CNM   BP 89/54 mmHg  Pulse 75  Temp(Src) 97.4 F (36.3 C) (Oral)  Resp 18  Ht 5' 6.5" (1.689 m)  SpO2 100%  LMP 05/19/2015 Physical Exam  Constitutional: She is oriented to person, place, and time. She appears well-developed and well-nourished. No distress.  Small statured African-American female, in a fetal position, but nontoxic in appearance.  HENT:  Head: Atraumatic.  Mouth is dry.  Eyes: Conjunctivae are normal.  Neck: Normal range of motion. Neck supple.  No nuchal rigidity  Cardiovascular: Normal rate, regular rhythm and intact distal pulses.   Pulmonary/Chest: Effort normal and breath sounds normal.  Abdominal: Soft. Bowel sounds are normal. She exhibits no distension. There is no tenderness.  Neurological: She is alert and oriented to person, place, and time.  Skin: No rash noted.  Psychiatric: She has a normal mood and affect.  Nursing note and vitals reviewed.   ED Course  Procedures (including critical care time)  Patient here with  nausea vomiting diarrhea suspicious of viral GI. She has a benign abdominal exam. She has a soft blood pressure however she is small in stature. Workup initiated, IV fluid given, antinausea medication and pain medication provided.  2:17 PM\ Evidence of anemia with hemoglobin of 8.1 however improve from prior values. E test is negative. Her urine shows moderate leukocyte esterase and 21-50 WBC with many squamous epithelial cells. Although patient denies having any significant dysuria to suggest UTI, she is concerned and requests for antibiotic. Her electrolytes panel is unremarkable. She is able to tolerates by mouth and felt better after receiving IV hydration. Patient will be discharge with antinausea medication and antibiotic. Return precaution discussed.   Labs Review Labs Reviewed  CBC WITH DIFFERENTIAL/PLATELET - Abnormal; Notable for the following:    Hemoglobin 8.1 (*)    HCT 28.0 (*)    MCV 60.5 (*)    MCH 17.5 (*)    MCHC 28.9 (*)    RDW 19.6 (*)    All other components within normal limits  COMPREHENSIVE METABOLIC PANEL - Abnormal; Notable for the following:    Total Protein 6.4 (*)    Albumin 3.3 (*)    All other components within normal limits  URINALYSIS, ROUTINE W REFLEX MICROSCOPIC (NOT AT [facility]) - Abnormal; Notable for the following:    Color, Urine AMBER (*)    Bilirubin Urine SMALL (*)    Ketones, ur 40 (*)    Leukocytes, UA MODERATE (*)    All other components within normal limits  LIPASE, BLOOD - Abnormal; Notable for the following:    Lipase 13 (*)    All other components within normal limits  URINE MICROSCOPIC-ADD ON - Abnormal; Notable for the following:    Squamous Epithelial / LPF MANY (*)    Bacteria, UA FEW (*)    All other components within normal limits  POC URINE PREG, ED    Imaging Review No results found.   EKG Interpretation None      MDM   Final diagnoses:  Nausea vomiting and diarrhea  Anemia, unspecified anemia type    BP 101/57  mmHg  Pulse 60  Temp(Src) 97.4 F (36.3 C) (Oral)  Resp 20  Ht 5' 6.5" (1.689 m)  SpO2 100%  LMP 05/19/2015  I have reviewed nursing notes and vital signs. I reviewed available ER/hospitalization records through the EMR     Fayrene Helper, PA-C 06/11/15 1422  Raeford Razor, MD 06/12/15 [phone]

## 2015-06-13 LAB — URINE CULTURE

## 2015-06-14 ENCOUNTER — Telehealth (HOSPITAL_COMMUNITY): Payer: Self-pay

## 2015-06-14 NOTE — Telephone Encounter (Signed)
Post ED Visit - Positive Culture Follow-up  Culture report reviewed by antimicrobial stewardship pharmacist: []  Wes Dulaney, Pharm.D., BCPS []  Celedonio MiyamotoJeremy Frens, Pharm.D., BCPS []  Georgina PillionElizabeth Martin, Pharm.D., BCPS []  [place]ager[place]h Pham, Vermont[place]arm[place]D., BCPS, AAHIVP [x]  Estella HuskMichelle Turner, Pharm.D., BCPS, AAHIVP []  Elder CyphersLorie Poole, [address] Rainbow BoulevardPharm.D., BCPS  Positive urine culture Treated with nitrofurantoin, organism sensitive to the same and no further patient follow-up is required at this time.  Ashley JacobsFesterman, Berneta Sconyers C 06/14/2015, 5:45 PM

## 2015-08-04 ENCOUNTER — Emergency Department (HOSPITAL_COMMUNITY)
Admission: EM | Admit: 2015-08-04 | Discharge: 2015-08-04 | Disposition: A | Discharge status: Home / Self Care | Payer: Medicaid Other | Attending: Emergency Medicine | Admitting: Emergency Medicine

## 2015-08-04 ENCOUNTER — Encounter (HOSPITAL_COMMUNITY): Payer: Self-pay | Admitting: *Deleted

## 2015-08-04 DIAGNOSIS — L02214 Cutaneous abscess of groin: Secondary | ICD-10-CM | POA: Diagnosis not present

## 2015-08-04 DIAGNOSIS — Z862 Personal history of diseases of the blood and blood-forming organs and certain disorders involving the immune mechanism: Secondary | ICD-10-CM | POA: Insufficient documentation

## 2015-08-04 DIAGNOSIS — Z8719 Personal history of other diseases of the digestive system: Secondary | ICD-10-CM | POA: Diagnosis not present

## 2015-08-04 DIAGNOSIS — Z720 Tobacco use: Secondary | ICD-10-CM | POA: Insufficient documentation

## 2015-08-04 DIAGNOSIS — Z793 Long term (current) use of hormonal contraceptives: Secondary | ICD-10-CM | POA: Insufficient documentation

## 2015-08-04 DIAGNOSIS — Z8659 Personal history of other mental and behavioral disorders: Secondary | ICD-10-CM | POA: Diagnosis not present

## 2015-08-04 DIAGNOSIS — Z880 Allergy status to penicillin: Secondary | ICD-10-CM | POA: Insufficient documentation

## 2015-08-04 DIAGNOSIS — R011 Cardiac murmur, unspecified: Secondary | ICD-10-CM | POA: Diagnosis not present

## 2015-08-04 DIAGNOSIS — R1909 Other intra-abdominal and pelvic swelling, mass and lump: Secondary | ICD-10-CM | POA: Diagnosis present

## 2015-08-04 MED ORDER — HYDROMORPHONE HCL 1 MG/ML IJ SOLN
1.0000 mg | Freq: Once | INTRAMUSCULAR | Status: AC
  Administered 2015-08-04: 1 mg via INTRAMUSCULAR
  Filled 2015-08-04: qty 1

## 2015-08-04 MED ORDER — DOXYCYCLINE HYCLATE 100 MG PO CAPS: 100.0000 mg | ORAL_CAPSULE | Freq: Two times a day (BID) | ORAL | Status: DC

## 2015-08-04 MED ORDER — LIDOCAINE HCL 2 % IJ SOLN
20.0000 mL | Freq: Once | INTRAMUSCULAR | Status: AC
  Administered 2015-08-04: 400 mg
  Filled 2015-08-04: qty 20

## 2015-08-04 MED ORDER — HYDROCODONE-ACETAMINOPHEN 5-325 MG PO TABS: 1.0000 | ORAL_TABLET | Freq: Four times a day (QID) | ORAL | Status: DC | PRN

## 2015-08-04 NOTE — ED Provider Notes (Signed)
CSN: [id number]     Arrival date & time 08/04/15  1511 History   First MD Initiated Contact with Patient 08/04/15 1717     Chief Complaint  Patient presents with  . Groin Swelling     (Consider location/radiation/quality/duration/timing/severity/associated sxs/prior Treatment) HPI   Patient is a 29 year old African-American female with history of abscess and anemia who presents to the ED with 3 month history of cyst in her groin.  Patient states the cyst was small and would improve with warm compresses.  She feels like it has completely resolved at times during the past 3 months, but then quickly return.  Patients states that approximately 1 week ago she waxed the area.  The next day she noticed a small bump and began using warm compresses again.  She states the area has continued to grow and has been draining some pus.  She denies fever or chills.   Past Medical History  Diagnosis Date  . Anemia   . Abscess of axilla   . Hemorrhoids   . Blood transfusion without reported diagnosis   . Heart murmur   . Substance abuse   . Headache(784.0)   . Anxiety   . Depression     bipolar; not on meds   Past Surgical History  Procedure Laterality Date  . No past surgeries     Family History  Problem Relation Age of Onset  . Hypertension Mother   . Diabetes type II Mother   . Arthritis Mother   . Hyperlipidemia Mother   . Cancer Father     lung   Social History  Substance Use Topics  . Smoking status: Current Every Day Smoker -- 0.50 packs/day for 9 years    Types: Cigarettes  . Smokeless tobacco: Never Used  . Alcohol Use: Yes     Comment: rare   OB History    Gravida Para Term Preterm AB TAB SAB Ectopic Multiple Living   3 2 1 1 1  0 1 0 0 2     Review of Systems  All other systems negative except as documented in the HPI. All pertinent positives and negatives as reviewed in the HPI.  Allergies  Amoxicillin and Penicillins  Home Medications   Prior to Admission  medications   Medication Sig Start Date End Date Taking? Authorizing Provider  cyclobenzaprine (FLEXERIL) 10 MG tablet Take 1 tablet (10 mg total) by mouth 2 (two) times daily as needed for muscle spasms. Patient not taking: Reported on 06/11/2015 03/18/15   Emilia Beck, PA-C  fluconazole (DIFLUCAN) 150 MG tablet Take 1 tablet (150 mg total) by mouth once. Patient not taking: Reported on 06/11/2015 11/04/14   [name], DO  meloxicam (MOBIC) 15 MG tablet Take 1 tablet (15 mg total) by mouth daily. Patient not taking: Reported on 06/11/2015 03/18/15   Emilia Beck, PA-C  naproxen (NAPROSYN) 375 MG tablet Take 1 tablet (375 mg total) by mouth 2 (two) times daily. Patient not taking: Reported on 06/11/2015 07/11/14   Marlon Pel, PA-C  nitrofurantoin, macrocrystal-monohydrate, (MACROBID) 100 MG capsule Take 1 capsule (100 mg total) by mouth 2 (two) times daily. 06/11/15   Fayrene Helper, PA-C  norgestimate-ethinyl estradiol (ORTHO-CYCLEN,SPRINTEC,PREVIFEM) 0.25-35 MG-MCG tablet Take 1 tablet by mouth daily. 07/12/14   Dorathy Kinsman, CNM  ondansetron (ZOFRAN) 4 MG tablet Take 1 tablet (4 mg total) by mouth every 8 (eight) hours as needed for nausea or vomiting. 06/11/15   Fayrene Helper, PA-C   BP 100/61 mmHg  Pulse 65  Temp(Src) 98.3 F (36.8 C) (Oral)  Resp 20  SpO2 100%  LMP 08/01/2015 Physical Exam  Constitutional: She appears well-developed and well-nourished. No distress.  HENT:  Head: Normocephalic and atraumatic.  Cardiovascular: Normal rate.   Pulmonary/Chest: Effort normal.  Genitourinary:    There is tenderness on the left labia.  Skin: She is not diaphoretic.  Nursing note and vitals reviewed.   ED Course  Procedures (including critical care time)  INCISION AND DRAINAGE Performed by: Carlyle Dolly Consent: Verbal consent obtained. Risks and benefits: risks, benefits and alternatives were discussed Type: abscess  Body area: labial abscess  Anesthesia:  local infiltration  Incision was made with a scalpel.  Local anesthetic: lidocaine 20% without epinephrine  Anesthetic total: 2 ml  Complexity: complex Blunt dissection to break up loculations  Drainage: purulent  Drainage amount: small  Packing material: 1/4 in iodoform gauze  Patient tolerance: Patient tolerated the procedure well with no immediate complications.   Patient be referred to surgery.  Told to return here as needed.  This could represent a cystic structure that may reaccumulate.  Patient recently in all questions were answered   Charlestine Night, PA-C 08/05/15 1610  Lavera Guise, MD 08/05/15 [phone]

## 2015-08-04 NOTE — ED Notes (Signed)
PA at bedside.

## 2015-08-04 NOTE — ED Notes (Signed)
Pt reports a cyst to her vaginal area. Pt states that she has had drainage from site as well. Pt states that she is unable to sit due to pain. [facility] but states that she has not been able to follow up yet

## 2015-08-04 NOTE — Discharge Instructions (Signed)
Return here as needed. Follow up with the surgeon provided. Use warm compresses around the area. Remove the packing in 3 days.

## 2015-09-07 ENCOUNTER — Encounter (HOSPITAL_COMMUNITY): Payer: Self-pay | Admitting: Emergency Medicine

## 2015-09-07 ENCOUNTER — Emergency Department (HOSPITAL_COMMUNITY)
Admission: EM | Admit: 2015-09-07 | Discharge: 2015-09-07 | Discharge status: Against Medical Advice | Payer: Medicaid Other | Attending: Emergency Medicine | Admitting: Emergency Medicine

## 2015-09-07 DIAGNOSIS — Z720 Tobacco use: Secondary | ICD-10-CM | POA: Diagnosis not present

## 2015-09-07 DIAGNOSIS — Z3201 Encounter for pregnancy test, result positive: Secondary | ICD-10-CM | POA: Insufficient documentation

## 2015-09-07 DIAGNOSIS — R109 Unspecified abdominal pain: Secondary | ICD-10-CM | POA: Insufficient documentation

## 2015-09-07 DIAGNOSIS — R011 Cardiac murmur, unspecified: Secondary | ICD-10-CM | POA: Diagnosis not present

## 2015-09-07 LAB — URINALYSIS, ROUTINE W REFLEX MICROSCOPIC
BILIRUBIN URINE: NEGATIVE
Glucose, UA: NEGATIVE mg/dL
HGB URINE DIPSTICK: NEGATIVE
Ketones, ur: NEGATIVE mg/dL
Nitrite: NEGATIVE
PROTEIN: NEGATIVE mg/dL
Specific Gravity, Urine: 1.023 (ref 1.005–1.030)
UROBILINOGEN UA: 1 mg/dL (ref 0.0–1.0)
pH: 8 (ref 5.0–8.0)

## 2015-09-07 LAB — URINE MICROSCOPIC-ADD ON

## 2015-09-07 LAB — POC URINE PREG, ED: PREG TEST UR: POSITIVE — AB

## 2015-09-07 NOTE — ED Notes (Signed)
Pt at [facility] first asking if name had been called, informed that she did not answer for triage. Pt placed back in triage and triage RN notified.

## 2015-09-07 NOTE — ED Notes (Signed)
Called for triage x1-no answer. 

## 2015-09-07 NOTE — ED Notes (Signed)
Called for room x3. No answer. 

## 2015-09-07 NOTE — ED Notes (Signed)
Called for triage with no answer x2  

## 2015-09-07 NOTE — ED Notes (Signed)
Pt sts lower abd pain worse on left side with LMP being 07/29/15; pt sts home pregnancy test was positive

## 2015-09-08 ENCOUNTER — Encounter (HOSPITAL_COMMUNITY): Payer: Self-pay | Admitting: *Deleted

## 2015-09-08 ENCOUNTER — Inpatient Hospital Stay (HOSPITAL_COMMUNITY): Payer: Medicaid Other

## 2015-09-08 ENCOUNTER — Inpatient Hospital Stay (HOSPITAL_COMMUNITY)
Admission: AD | Admit: 2015-09-08 | Discharge: 2015-09-08 | Disposition: A | Discharge status: Home / Self Care | Payer: Medicaid Other | Source: Ambulatory Visit | Attending: Family Medicine | Admitting: Family Medicine

## 2015-09-08 DIAGNOSIS — A599 Trichomoniasis, unspecified: Secondary | ICD-10-CM | POA: Diagnosis not present

## 2015-09-08 DIAGNOSIS — O99331 Smoking (tobacco) complicating pregnancy, first trimester: Secondary | ICD-10-CM | POA: Diagnosis not present

## 2015-09-08 DIAGNOSIS — O98311 Other infections with a predominantly sexual mode of transmission complicating pregnancy, first trimester: Secondary | ICD-10-CM | POA: Diagnosis not present

## 2015-09-08 DIAGNOSIS — F17210 Nicotine dependence, cigarettes, uncomplicated: Secondary | ICD-10-CM | POA: Diagnosis not present

## 2015-09-08 DIAGNOSIS — Z880 Allergy status to penicillin: Secondary | ICD-10-CM | POA: Insufficient documentation

## 2015-09-08 DIAGNOSIS — Z3A01 Less than 8 weeks gestation of pregnancy: Secondary | ICD-10-CM | POA: Diagnosis not present

## 2015-09-08 DIAGNOSIS — R109 Unspecified abdominal pain: Secondary | ICD-10-CM | POA: Diagnosis present

## 2015-09-08 DIAGNOSIS — R102 Pelvic and perineal pain: Secondary | ICD-10-CM

## 2015-09-08 DIAGNOSIS — A5901 Trichomonal vulvovaginitis: Secondary | ICD-10-CM | POA: Insufficient documentation

## 2015-09-08 DIAGNOSIS — M545 Low back pain: Secondary | ICD-10-CM | POA: Insufficient documentation

## 2015-09-08 DIAGNOSIS — O26891 Other specified pregnancy related conditions, first trimester: Secondary | ICD-10-CM

## 2015-09-08 LAB — WET PREP, GENITAL: YEAST WET PREP: NONE SEEN

## 2015-09-08 LAB — CBC
HCT: 29.7 % — ABNORMAL LOW (ref 36.0–46.0)
Hemoglobin: 8.7 g/dL — ABNORMAL LOW (ref 12.0–15.0)
MCH: 17.1 pg — ABNORMAL LOW (ref 26.0–34.0)
MCHC: 29.3 g/dL — ABNORMAL LOW (ref 30.0–36.0)
MCV: 58.3 fL — ABNORMAL LOW (ref 78.0–100.0)
PLATELETS: 432 10*3/uL — AB (ref 150–400)
RBC: 5.09 MIL/uL (ref 3.87–5.11)
RDW: 20.7 % — ABNORMAL HIGH (ref 11.5–15.5)
WBC: 6.8 10*3/uL (ref 4.0–10.5)

## 2015-09-08 LAB — URINALYSIS, ROUTINE W REFLEX MICROSCOPIC
BILIRUBIN URINE: NEGATIVE
Glucose, UA: NEGATIVE mg/dL
Hgb urine dipstick: NEGATIVE
KETONES UR: NEGATIVE mg/dL
Leukocytes, UA: NEGATIVE
NITRITE: NEGATIVE
Protein, ur: NEGATIVE mg/dL
SPECIFIC GRAVITY, URINE: 1.025 (ref 1.005–1.030)
UROBILINOGEN UA: 0.2 mg/dL (ref 0.0–1.0)
pH: 6.5 (ref 5.0–8.0)

## 2015-09-08 LAB — HCG, QUANTITATIVE, PREGNANCY: HCG, BETA CHAIN, QUANT, S: 10254 m[IU]/mL — AB (ref <5)

## 2015-09-08 MED ORDER — PROMETHAZINE HCL 25 MG PO TABS: 12.5000 mg | ORAL_TABLET | Freq: Four times a day (QID) | ORAL | Status: DC | PRN

## 2015-09-08 MED ORDER — METRONIDAZOLE 500 MG PO TABS: 500.0000 mg | ORAL_TABLET | Freq: Two times a day (BID) | ORAL | Status: DC

## 2015-09-08 NOTE — MAU Note (Signed)
Pos UPT @ [facility] yesterday, having pinching pain on L side of abdomen - upper & lower.  Was hurting this a.m., not hurting now.  Denies bleeding, having clear mucus discharge.

## 2015-09-08 NOTE — Discharge Instructions (Signed)
Your lab results today show that you have trichomonas infection. You sex partner will need to be treated for the infection also.   Start your prenatal care. Return here as needed for any problems.   Abdominal Pain During Pregnancy Abdominal pain is common in pregnancy. Most of the time, it does not cause harm. There are many causes of abdominal pain. Some causes are more serious than others. Some of the causes of abdominal pain in pregnancy are easily diagnosed. Occasionally, the diagnosis takes time to understand. Other times, the cause is not determined. Abdominal pain can be a sign that something is very wrong with the pregnancy, or the pain may have nothing to do with the pregnancy at all. For this reason, always tell your health care provider if you have any abdominal discomfort. HOME CARE INSTRUCTIONS  Monitor your abdominal pain for any changes. The following actions may help to alleviate any discomfort you are experiencing:  Do not have sexual intercourse or put anything in your vagina until your symptoms go away completely.  Get plenty of rest until your pain improves.  Drink clear fluids if you feel nauseous. Avoid solid food as long as you are uncomfortable or nauseous.  Only take over-the-counter or prescription medicine as directed by your health care provider.  Keep all follow-up appointments with your health care provider. SEEK IMMEDIATE MEDICAL CARE IF:  You are bleeding, leaking fluid, or passing tissue from the vagina.  You have increasing pain or cramping.  You have persistent vomiting.  You have painful or bloody urination.  You have a fever.  You notice a decrease in your baby's movements.  You have extreme weakness or feel faint.  You have shortness of breath, with or without abdominal pain.  You develop a severe headache with abdominal pain.  You have abnormal vaginal discharge with abdominal pain.  You have persistent diarrhea.  You have abdominal  pain that continues even after rest, or gets worse. MAKE SURE YOU:   Understand these instructions.  Will watch your condition.  Will get help right away if you are not doing well or get worse. Document Released: 12/04/2005 Document Revised: 09/24/2013 Document Reviewed: 07/03/2013 [facility] Patient Information 2015 [place], [place]. This information is not intended to replace advice given to you by your health care provider. Make sure you discuss any questions you have with your health care provider.

## 2015-09-08 NOTE — MAU Provider Note (Signed)
CSN: [id number]     Arrival date & time 09/08/15  1411 History   None    Chief Complaint  Patient presents with  . Abdominal Pain     (Consider location/radiation/quality/duration/timing/severity/associated sxs/prior Treatment) Patient is a 29 y.o. female presenting with abdominal pain. The history is provided by the patient.  Abdominal Pain The primary symptoms of the illness include abdominal pain, fatigue and vaginal discharge. The primary symptoms of the illness do not include fever, shortness of breath, nausea, vomiting, dysuria or vaginal bleeding. The current episode started more than 2 days ago. The onset of the illness was gradual. The problem has not changed since onset. The vaginal discharge is not associated with dysuria.   Additional symptoms associated with the illness include back pain.   [name] [name] is a [id number] @ [redacted]w[redacted]d gestation who presents to the ED with left side abdominal pain and feeling tired. The symptoms started 3 days ago. She has a hx of anemia. She had a positive HPT.  She reports occasional spotting that she only sees when she wipes.   Past Medical History  Diagnosis Date  . Anemia   . Abscess of axilla   . Hemorrhoids   . Blood transfusion without reported diagnosis   . Heart murmur   . Substance abuse   . Headache(784.0)   . Anxiety   . Depression     bipolar; not on meds   Past Surgical History  Procedure Laterality Date  . No past surgeries     Family History  Problem Relation Age of Onset  . Hypertension Mother   . Diabetes type II Mother   . Arthritis Mother   . Hyperlipidemia Mother   . Cancer Father     lung   Social History  Substance Use Topics  . Smoking status: Current Every Day Smoker -- 0.50 packs/day for 9 years    Types: Cigarettes  . Smokeless tobacco: Never Used  . Alcohol Use: Yes     Comment: rare   OB History    Gravida Para Term Preterm AB TAB SAB Ectopic Multiple Living   4 2 1 1 1  0 1 0 0 2     Review of  Systems  Constitutional: Positive for fatigue. Negative for fever.  HENT: Negative.   Eyes: Negative for visual disturbance.  Respiratory: Negative for shortness of breath.   Cardiovascular: Negative for chest pain.  Gastrointestinal: Positive for abdominal pain. Negative for nausea and vomiting.  Genitourinary: Positive for vaginal discharge. Negative for dysuria and vaginal bleeding.  Musculoskeletal: Positive for back pain.  Skin: Negative for rash.  Neurological: Positive for light-headedness. Negative for headaches.  Psychiatric/Behavioral: Negative for confusion. The patient is not nervous/anxious.       Allergies  Amoxicillin and Penicillins  Home Medications   Prior to Admission medications   Medication Sig Start Date End Date Taking? Authorizing Provider  acetaminophen (TYLENOL) 500 MG tablet Take 500 mg by mouth every 6 (six) hours as needed for moderate pain.    Historical Provider, MD  aspirin 325 MG tablet Take 325 mg by mouth every 6 (six) hours as needed for moderate pain.    Historical Provider, MD  Aspirin-Salicylamide-Caffeine (BC HEADACHE POWDER PO) Take 1 Package by mouth daily as needed (FOR PAIN).    Historical Provider, MD  cyclobenzaprine (FLEXERIL) 10 MG tablet Take 1 tablet (10 mg total) by mouth 2 (two) times daily as needed for muscle spasms. Patient not taking: Reported on 06/11/2015 03/18/15  Kaitlyn Szekalski, PA-C  doxycycline (VIBRAMYCIN) 100 MG capsule Take 1 capsule (100 mg total) by mouth 2 (two) times daily. 08/04/15   Charlestine Night, PA-C  fluconazole (DIFLUCAN) 150 MG tablet Take 1 tablet (150 mg total) by mouth once. Patient not taking: Reported on 06/11/2015 11/04/14   [name], DO  meloxicam (MOBIC) 15 MG tablet Take 1 tablet (15 mg total) by mouth daily. Patient not taking: Reported on 06/11/2015 03/18/15   Emilia Beck, PA-C  metroNIDAZOLE (FLAGYL) 500 MG tablet Take 1 tablet (500 mg total) by mouth 2 (two) times daily. 09/08/15    Hope Orlene Och, NP  naproxen (NAPROSYN) 375 MG tablet Take 1 tablet (375 mg total) by mouth 2 (two) times daily. Patient not taking: Reported on 06/11/2015 07/11/14   Marlon Pel, PA-C  nitrofurantoin, macrocrystal-monohydrate, (MACROBID) 100 MG capsule Take 1 capsule (100 mg total) by mouth 2 (two) times daily. 06/11/15   Fayrene Helper, PA-C  norgestimate-ethinyl estradiol (ORTHO-CYCLEN,SPRINTEC,PREVIFEM) 0.25-35 MG-MCG tablet Take 1 tablet by mouth daily. 07/12/14   Dorathy Kinsman, CNM  ondansetron (ZOFRAN) 4 MG tablet Take 1 tablet (4 mg total) by mouth every 8 (eight) hours as needed for nausea or vomiting. 06/11/15   Fayrene Helper, PA-C  promethazine (PHENERGAN) 25 MG tablet Take 0.5 tablets (12.5 mg total) by mouth every 6 (six) hours as needed. 09/08/15   Hope Orlene Och, NP   BP 98/63 mmHg  Pulse 64  Temp(Src) 98.2 F (36.8 C) (Oral)  Resp 16  Ht 5' 6.5" (1.689 m)  Wt 116 lb (52.617 kg)  BMI 18.44 kg/m2  LMP 07/29/2015 (Approximate) Physical Exam  Constitutional: She is oriented to person, place, and time. She appears well-developed and well-nourished.  HENT:  Head: Normocephalic.  Eyes: Conjunctivae and EOM are normal.  Neck: Normal range of motion. Neck supple.  Cardiovascular: Normal rate.   Pulmonary/Chest: Effort normal.  Abdominal: Soft. There is no tenderness.  Genitourinary:  External genitalia without lesions. Frothy discharge vaginal vault. Cervix inflamed and polyp noted. Cervix closed, long, no CMT, no adnexal tenderness or mass palpated. Uterus with minimal enlargement.   Musculoskeletal: Normal range of motion.  Neurological: She is alert and oriented to person, place, and time. No cranial nerve deficit.  Skin: Skin is warm and dry.  Psychiatric: She has a normal mood and affect. Her behavior is normal.  Nursing note and vitals reviewed.   ED Course  Procedures (including critical care time) Labs Review Results for orders placed or performed during the hospital encounter  of 09/08/15 (from the past 24 hour(s))  Urinalysis, Routine w reflex microscopic (not at [facility])     Status: None   Collection Time: 09/08/15  2:55 PM  Result Value Ref Range   Color, Urine YELLOW YELLOW   APPearance CLEAR CLEAR   Specific Gravity, Urine 1.025 1.005 - 1.030   pH 6.5 5.0 - 8.0   Glucose, UA NEGATIVE NEGATIVE mg/dL   Hgb urine dipstick NEGATIVE NEGATIVE   Bilirubin Urine NEGATIVE NEGATIVE   Ketones, ur NEGATIVE NEGATIVE mg/dL   Protein, ur NEGATIVE NEGATIVE mg/dL   Urobilinogen, UA 0.2 0.0 - 1.0 mg/dL   Nitrite NEGATIVE NEGATIVE   Leukocytes, UA NEGATIVE NEGATIVE  CBC     Status: Abnormal   Collection Time: 09/08/15  4:26 PM  Result Value Ref Range   WBC 6.8 4.0 - 10.5 K/uL   RBC 5.09 3.87 - 5.11 MIL/uL   Hemoglobin 8.7 (L) 12.0 - 15.0 g/dL   HCT 16.1 (L)  36.0 - 46.0 %   MCV 58.3 (L) 78.0 - 100.0 fL   MCH 17.1 (L) 26.0 - 34.0 pg   MCHC 29.3 (L) 30.0 - 36.0 g/dL   RDW 40.9 (H) 81.1 - 91.4 %   Platelets 432 (H) 150 - 400 K/uL  hCG, quantitative, pregnancy     Status: Abnormal   Collection Time: 09/08/15  4:26 PM  Result Value Ref Range   hCG, Beta Chain, Quant, S 10254 (H) <5 mIU/mL  Wet prep, genital     Status: Abnormal   Collection Time: 09/08/15  4:55 PM  Result Value Ref Range   Yeast Wet Prep HPF POC NONE SEEN NONE SEEN   Trich, Wet Prep MODERATE (A) NONE SEEN   Clue Cells Wet Prep HPF POC FEW (A) NONE SEEN   WBC, Wet Prep HPF POC MODERATE (A) NONE SEEN     Imaging Review US Ob Comp Less 14 Wks  09/08/2015   CLINICAL DATA:  Pregnant, left lower quadrant pain  EXAM: OBSTETRIC <14 WK [name] AND TRANSVAGINAL OB US  TECHNIQUE: Both transabdominal and transvaginal ultrasound examinations were performed for complete evaluation of the gestation as well as the maternal uterus, adnexal regions, and pelvic cul-de-sac. Transvaginal technique was performed to assess early pregnancy.  COMPARISON:  None.  FINDINGS: Intrauterine gestational sac: Visualized/normal in shape.   Yolk sac:  Not visualized  Embryo:  Not visualized  MSD: 10.2  mm   5 w   5  d  Maternal uterus/adnexae: No subchronic hemorrhage.  Bilateral ovaries are within normal limits, noting a small left corpus luteal cyst.  No free fluid.  IMPRESSION: Single intrauterine gestational sac, measuring 5 weeks 5 days by mean sac diameter. No yolk sac or fetal pole is visualized.  Consider follow-up pelvic ultrasound in 14 days as clinically warranted.   Electronically Signed   By: Charline Bills M.D.   On: 09/08/2015 17:49   US Ob Transvaginal  09/08/2015   CLINICAL DATA:  Pregnant, left lower quadrant pain  EXAM: OBSTETRIC <14 WK [name] AND TRANSVAGINAL OB US  TECHNIQUE: Both transabdominal and transvaginal ultrasound examinations were performed for complete evaluation of the gestation as well as the maternal uterus, adnexal regions, and pelvic cul-de-sac. Transvaginal technique was performed to assess early pregnancy.  COMPARISON:  None.  FINDINGS: Intrauterine gestational sac: Visualized/normal in shape.  Yolk sac:  Not visualized  Embryo:  Not visualized  MSD: 10.2  mm   5 w   5  d  Maternal uterus/adnexae: No subchronic hemorrhage.  Bilateral ovaries are within normal limits, noting a small left corpus luteal cyst.  No free fluid.  IMPRESSION: Single intrauterine gestational sac, measuring 5 weeks 5 days by mean sac diameter. No yolk sac or fetal pole is visualized.  Consider follow-up pelvic ultrasound in 14 days as clinically warranted.   Electronically Signed   By: Charline Bills M.D.   On: 09/08/2015 17:49   I have personally reviewed and evaluated these images and lab results as part of my medical decision-making.   MDM  29 y.o. female with low back pain and occasional abdominal cramping with vaginal d/c in early pregnancy. Stable for d/c without acute abdomen. Will treat for trichomonas infection. ultrasound shows 5 week 5 day IUGS.  Patient to return in 10 days for follow up ultrasound or sooner for  problems.  Rx given for partner treatment Rosezetta Schlatter, Flagyl 2 grams PO x 1.  Discussed with the patient and all questioned fully  answered. She will return for U/S or sooner if any problems arise.   Final diagnoses:  Trichomonas infection  abdominal cramping in early pregnancy

## 2015-09-09 LAB — RPR: RPR Ser Ql: NONREACTIVE

## 2015-09-09 LAB — GC/CHLAMYDIA PROBE AMP (~~LOC~~) NOT AT ARMC
CHLAMYDIA, DNA PROBE: NEGATIVE
NEISSERIA GONORRHEA: NEGATIVE

## 2015-09-09 LAB — HIV ANTIBODY (ROUTINE TESTING W REFLEX): HIV SCREEN 4TH GENERATION: NONREACTIVE

## 2015-09-18 ENCOUNTER — Inpatient Hospital Stay (HOSPITAL_COMMUNITY)
Admission: AD | Admit: 2015-09-18 | Discharge: 2015-09-18 | Disposition: A | Discharge status: Home / Self Care | Payer: Medicaid Other | Source: Ambulatory Visit | Attending: Obstetrics & Gynecology | Admitting: Obstetrics & Gynecology

## 2015-09-18 DIAGNOSIS — O23591 Infection of other part of genital tract in pregnancy, first trimester: Secondary | ICD-10-CM

## 2015-09-18 DIAGNOSIS — Z3A01 Less than 8 weeks gestation of pregnancy: Secondary | ICD-10-CM | POA: Diagnosis not present

## 2015-09-18 DIAGNOSIS — A5901 Trichomonal vulvovaginitis: Secondary | ICD-10-CM | POA: Diagnosis not present

## 2015-09-18 DIAGNOSIS — O26891 Other specified pregnancy related conditions, first trimester: Secondary | ICD-10-CM | POA: Insufficient documentation

## 2015-09-18 DIAGNOSIS — B373 Candidiasis of vulva and vagina: Secondary | ICD-10-CM

## 2015-09-18 DIAGNOSIS — B3731 Acute candidiasis of vulva and vagina: Secondary | ICD-10-CM

## 2015-09-18 LAB — WET PREP, GENITAL
CLUE CELLS WET PREP: NONE SEEN
TRICH WET PREP: NONE SEEN

## 2015-09-18 MED ORDER — TERCONAZOLE 0.4 % VA CREA: 1.0000 | TOPICAL_CREAM | Freq: Every day | VAGINAL | Status: DC

## 2015-09-18 NOTE — MAU Provider Note (Signed)
History     CSN: [id number]  Arrival date and time: 09/18/15 1413   None     No chief complaint on file.  HPI This is a 29 y.o. female at [redacted]w[redacted]d who presents requesting an ultrasound and followup testing for Trichomonas. She was seen on 09/08/15    Ultrasound that day showed [redacted]w[redacted]d intrauterine gestational sac.  She was found to have Trich and was treated with Flagyl.  Was given script for partner but is not sure if he took it or not. Was told she would have a f/u US in 10 days.   RN Note:   Expand All Collapse All   Pt presents to MAU stating that she was told to come to MAU for follow up on her STD and ultrasound.         OB History    Gravida Para Term Preterm AB TAB SAB Ectopic Multiple Living   0 1 0 0 2      Past Medical History  Diagnosis Date  . Anemia   . Abscess of axilla   . Hemorrhoids   . Blood transfusion without reported diagnosis   . Heart murmur   . Substance abuse   . Headache(784.0)   . Anxiety   . Depression     bipolar; not on meds    Past Surgical History  Procedure Laterality Date  . No past surgeries      Family History  Problem Relation Age of Onset  . Hypertension Mother   . Diabetes type II Mother   . Arthritis Mother   . Hyperlipidemia Mother   . Cancer Father     lung    Social History  Substance Use Topics  . Smoking status: Current Every Day Smoker -- 0.50 packs/day for 9 years    Types: Cigarettes  . Smokeless tobacco: Never Used  . Alcohol Use: Yes     Comment: rare    Allergies:  Allergies  Allergen Reactions  . Amoxicillin Nausea And Vomiting  . Penicillins Nausea And Vomiting    Prescriptions prior to admission  Medication Sig Dispense Refill Last Dose  . Ascorbic Acid (VITAMIN C PO) Take 1 tablet by mouth daily.   09/08/2015 at Unknown time  . metroNIDAZOLE (FLAGYL) 500 MG tablet Take 1 tablet (500 mg total) by mouth 2 (two) times daily. 14 tablet 0   . nitrofurantoin, macrocrystal-monohydrate,  (MACROBID) 100 MG capsule Take 1 capsule (100 mg total) by mouth 2 (two) times daily. (Patient not taking: Reported on 09/08/2015) 10 capsule 0   . ondansetron (ZOFRAN) 4 MG tablet Take 1 tablet (4 mg total) by mouth every 8 (eight) hours as needed for nausea or vomiting. (Patient not taking: Reported on 09/08/2015) 12 tablet 0   . promethazine (PHENERGAN) 25 MG tablet Take 0.5 tablets (12.5 mg total) by mouth every 6 (six) hours as needed. 20 tablet 0     Review of Systems  Constitutional: Negative for fever, chills and malaise/fatigue.  Gastrointestinal: Negative for nausea, vomiting, abdominal pain, diarrhea and constipation.  Neurological: Negative for dizziness, weakness and headaches.   Physical Exam   Last menstrual period 07/29/2015.  Physical Exam  Constitutional: She is oriented to person, place, and time. She appears well-developed and well-nourished. No distress.  HENT:  Head: Normocephalic.  Cardiovascular: Normal rate.   Respiratory: Effort normal. No respiratory distress.  Genitourinary:  Exam not indicated   Musculoskeletal: Normal range of motion.  Neurological: She is  alert and oriented to person, place, and time.  Skin: Skin is warm and dry.  Psychiatric: She has a normal mood and affect.    MAU Course  Procedures  MDM DIscussed we usually do the followup US's as outpatient.   Assessment and Plan  A:  Pregnancy at [redacted]w[redacted]d        Single intrauterine sac seen at 5.5 wks        No pain or bleeding  P:  Discussed we usually do followup US as outpatient       Order put in for outpatient [name] this week        Encouraged her to have partner take meds and avoid IC for 2 weeks        Followup with [facility] for prenatal care  [facility] 09/18/2015, 2:37 PM

## 2015-09-18 NOTE — Discharge Instructions (Signed)
First Trimester of Pregnancy The first trimester of pregnancy is from week 1 until the end of week 12 (months 1 through 3). A week after a sperm fertilizes an egg, the egg will implant on the wall of the uterus. This embryo will begin to develop into a baby. Genes from you and your partner are forming the baby. The female genes determine whether the baby is a boy or a girl. At 6-8 weeks, the eyes and face are formed, and the heartbeat can be seen on ultrasound. At the end of 12 weeks, all the baby's organs are formed.  Now that you are pregnant, you will want to do everything you can to have a healthy baby. Two of the most important things are to get good prenatal care and to follow your health care provider's instructions. Prenatal care is all the medical care you receive before the baby's birth. This care will help prevent, find, and treat any problems during the pregnancy and childbirth. BODY CHANGES Your body goes through many changes during pregnancy. The changes vary from woman to woman.   You may gain or lose a couple of pounds at first.  You may feel sick to your stomach (nauseous) and throw up (vomit). If the vomiting is uncontrollable, call your health care provider.  You may tire easily.  You may develop headaches that can be relieved by medicines approved by your health care provider.  You may urinate more often. Painful urination may mean you have a bladder infection.  You may develop heartburn as a result of your pregnancy.  You may develop constipation because certain hormones are causing the muscles that push waste through your intestines to slow down.  You may develop hemorrhoids or swollen, bulging veins (varicose veins).  Your breasts may begin to grow larger and become tender. Your nipples may stick out more, and the tissue that surrounds them (areola) may become darker.  Your gums may bleed and may be sensitive to brushing and flossing.  Dark spots or blotches (chloasma,  mask of pregnancy) may develop on your face. This will likely fade after the baby is born.  Your menstrual periods will stop.  You may have a loss of appetite.  You may develop cravings for certain kinds of food.  You may have changes in your emotions from day to day, such as being excited to be pregnant or being concerned that something may go wrong with the pregnancy and baby.  You may have more vivid and strange dreams.  You may have changes in your hair. These can include thickening of your hair, rapid growth, and changes in texture. Some women also have hair loss during or after pregnancy, or hair that feels dry or thin. Your hair will most likely return to normal after your baby is born. WHAT TO EXPECT AT YOUR PRENATAL VISITS During a routine prenatal visit:  You will be weighed to make sure you and the baby are growing normally.  Your blood pressure will be taken.  Your abdomen will be measured to track your baby's growth.  The fetal heartbeat will be listened to starting around week 10 or 12 of your pregnancy.  Test results from any previous visits will be discussed. Your health care provider may ask you:  How you are feeling.  If you are feeling the baby move.  If you have had any abnormal symptoms, such as leaking fluid, bleeding, severe headaches, or abdominal cramping.  If you have any questions. Other tests   that may be performed during your first trimester include:  Blood tests to find your blood type and to check for the presence of any previous infections. They will also be used to check for low iron levels (anemia) and Rh antibodies. Later in the pregnancy, blood tests for diabetes will be done along with other tests if problems develop.  Urine tests to check for infections, diabetes, or protein in the urine.  An ultrasound to confirm the proper growth and development of the baby.  An amniocentesis to check for possible genetic problems.  Fetal screens for  spina bifida and Down syndrome.  You may need other tests to make sure you and the baby are doing well. HOME CARE INSTRUCTIONS  Medicines  Follow your health care provider's instructions regarding medicine use. Specific medicines may be either safe or unsafe to take during pregnancy.  Take your prenatal vitamins as directed.  If you develop constipation, try taking a stool softener if your health care provider approves. Diet  Eat regular, well-balanced meals. Choose a variety of foods, such as meat or vegetable-based protein, fish, milk and low-fat dairy products, vegetables, fruits, and whole grain breads and cereals. Your health care provider will help you determine the amount of weight gain that is right for you.  Avoid raw meat and uncooked cheese. These carry germs that can cause birth defects in the baby.  Eating four or five small meals rather than three large meals a day may help relieve nausea and vomiting. If you start to feel nauseous, eating a few soda crackers can be helpful. Drinking liquids between meals instead of during meals also seems to help nausea and vomiting.  If you develop constipation, eat more high-fiber foods, such as fresh vegetables or fruit and whole grains. Drink enough fluids to keep your urine clear or pale yellow. Activity and Exercise  Exercise only as directed by your health care provider. Exercising will help you:  Control your weight.  Stay in shape.  Be prepared for labor and delivery.  Experiencing pain or cramping in the lower abdomen or low back is a good sign that you should stop exercising. Check with your health care provider before continuing normal exercises.  Try to avoid standing for long periods of time. Move your legs often if you must stand in one place for a long time.  Avoid heavy lifting.  Wear low-heeled shoes, and practice good posture.  You may continue to have sex unless your health care provider directs you  otherwise. Relief of Pain or Discomfort  Wear a good support bra for breast tenderness.   Take warm sitz baths to soothe any pain or discomfort caused by hemorrhoids. Use hemorrhoid cream if your health care provider approves.   Rest with your legs elevated if you have leg cramps or low back pain.  If you develop varicose veins in your legs, wear support hose. Elevate your feet for 15 minutes, 3-4 times a day. Limit salt in your diet. Prenatal Care  Schedule your prenatal visits by the twelfth week of pregnancy. They are usually scheduled monthly at first, then more often in the last 2 months before delivery.  Write down your questions. Take them to your prenatal visits.  Keep all your prenatal visits as directed by your health care provider. Safety  Wear your seat belt at all times when driving.  Make a list of emergency phone numbers, including numbers for family, friends, the hospital, and police and fire departments. General Tips    Ask your health care provider for a referral to a local prenatal education class. Begin classes no later than at the beginning of month 6 of your pregnancy.  Ask for help if you have counseling or nutritional needs during pregnancy. Your health care provider can offer advice or refer you to specialists for help with various needs.  Do not use hot tubs, steam rooms, or saunas.  Do not douche or use tampons or scented sanitary pads.  Do not cross your legs for long periods of time.  Avoid cat litter boxes and soil used by cats. These carry germs that can cause birth defects in the baby and possibly loss of the fetus by miscarriage or stillbirth.  Avoid all smoking, herbs, alcohol, and medicines not prescribed by your health care provider. Chemicals in these affect the formation and growth of the baby.  Schedule a dentist appointment. At home, brush your teeth with a soft toothbrush and be gentle when you floss. SEEK MEDICAL CARE IF:   You have  dizziness.  You have mild pelvic cramps, pelvic pressure, or nagging pain in the abdominal area.  You have persistent nausea, vomiting, or diarrhea.  You have a bad smelling vaginal discharge.  You have pain with urination.  You notice increased swelling in your face, hands, legs, or ankles. SEEK IMMEDIATE MEDICAL CARE IF:   You have a fever.  You are leaking fluid from your vagina.  You have spotting or bleeding from your vagina.  You have severe abdominal cramping or pain.  You have rapid weight gain or loss.  You vomit blood or material that looks like coffee grounds.  You are exposed to German measles and have never had them.  You are exposed to fifth disease or chickenpox.  You develop a severe headache.  You have shortness of breath.  You have any kind of trauma, such as from a fall or a car accident. Document Released: 11/28/2001 Document Revised: 04/20/2014 Document Reviewed: 10/14/2013 [employer] Patient Information 2015 [employer]. This information is not intended to replace advice given to you by your health care provider. Make sure you discuss any questions you have with your health care provider.  

## 2015-09-18 NOTE — MAU Note (Signed)
Pt presents to MAU stating that she was told to come to MAU for follow up on her STD and ultrasound.

## 2015-09-22 ENCOUNTER — Inpatient Hospital Stay (HOSPITAL_COMMUNITY)
Admission: AD | Admit: 2015-09-22 | Discharge: 2015-09-22 | Disposition: A | Discharge status: Home / Self Care | Payer: Medicaid Other | Source: Ambulatory Visit | Attending: Obstetrics | Admitting: Obstetrics

## 2015-09-22 ENCOUNTER — Ambulatory Visit (HOSPITAL_COMMUNITY)
Admission: RE | Admit: 2015-09-22 | Discharge: 2015-09-22 | Disposition: A | Discharge status: Home / Self Care | Payer: Medicaid Other | Source: Ambulatory Visit | Attending: Advanced Practice Midwife | Admitting: Advanced Practice Midwife

## 2015-09-22 DIAGNOSIS — O208 Other hemorrhage in early pregnancy: Secondary | ICD-10-CM | POA: Insufficient documentation

## 2015-09-22 DIAGNOSIS — O3481 Maternal care for other abnormalities of pelvic organs, first trimester: Secondary | ICD-10-CM | POA: Insufficient documentation

## 2015-09-22 DIAGNOSIS — O26891 Other specified pregnancy related conditions, first trimester: Secondary | ICD-10-CM | POA: Diagnosis not present

## 2015-09-22 DIAGNOSIS — O468X1 Other antepartum hemorrhage, first trimester: Secondary | ICD-10-CM

## 2015-09-22 DIAGNOSIS — Z36 Encounter for antenatal screening of mother: Secondary | ICD-10-CM | POA: Diagnosis not present

## 2015-09-22 DIAGNOSIS — O418X10 Other specified disorders of amniotic fluid and membranes, first trimester, not applicable or unspecified: Secondary | ICD-10-CM

## 2015-09-22 DIAGNOSIS — Z3A01 Less than 8 weeks gestation of pregnancy: Secondary | ICD-10-CM | POA: Diagnosis not present

## 2015-09-22 DIAGNOSIS — N8312 Corpus luteum cyst of left ovary: Secondary | ICD-10-CM | POA: Insufficient documentation

## 2015-09-22 DIAGNOSIS — Z3491 Encounter for supervision of normal pregnancy, unspecified, first trimester: Secondary | ICD-10-CM

## 2015-09-22 NOTE — Discharge Instructions (Signed)
Before [facility] Before your baby arrives it is important to:  Have all of the supplies that you will need to care for your baby.  Know where to go if there is an emergency.  Discuss the baby's arrival with other family members. WHAT SUPPLIES WILL I NEED? It is recommended that you have the following supplies: Large Items  Crib.  Crib mattress.  Rear-facing infant car seat. If possible, have a trained professional check to make sure that it is installed correctly. Feeding  6-8 bottles that are 4-5 oz in size.  6-8 nipples.  Bottle brush.  Sterilizer, or a large pan or kettle with a lid.  A way to boil and cool water.  If you will be breastfeeding:  Breast pump.  Nipple cream.  Nursing bra.  Breast pads.  Breast shields.  If you will be formula feeding:  Formula.  Measuring cups.  Measuring spoons. Bathing  Mild baby soap and baby shampoo.  Petroleum jelly.  Soft cloth towel and washcloth.  Hooded towel.  Cotton balls.  Bath basin. Other Supplies  Rectal thermometer.  Bulb syringe.  Baby wipes or washcloths for diaper changes.  Diaper bag.  Changing pad.  Clothing, including one-piece outfits and pajamas.  Baby nail clippers.  Receiving blankets.  Mattress pad and sheets for the crib.  Night-light for the baby's room.  Baby monitor.  2 or 3 pacifiers.  Either 24-36 cloth diapers and waterproof diaper covers or a box of disposable diapers. You may need to use as many as 10-12 diapers per day. HOW DO I PREPARE FOR AN EMERGENCY? Prepare for an emergency by:  Knowing how to get to the nearest hospital.  Listing the phone numbers of your baby's health care providers near your home phone and in your cell phone. HOW DO I PREPARE MY FAMILY?  Decide how to handle visitors.  If you have other children:  Talk with them about the baby coming home. Ask them how they feel about it.  Read a book together about being a new big  brother or sister.  Find ways to let them help you prepare for the new baby.  Have someone ready to care for them while you are in the hospital.   This information is not intended to replace advice given to you by your health care provider. Make sure you discuss any questions you have with your health care provider.   Document Released: 11/16/2008 Document Revised: 04/20/2015 Document Reviewed: 11/11/2014 Elsevier Interactive Patient Education 2016 [employer].  First Trimester of Pregnancy The first trimester of pregnancy is from week 1 until the end of week 12 (months 1 through 3). A week after a sperm fertilizes an egg, the egg will implant on the wall of the uterus. This embryo will begin to develop into a baby. Genes from you and your partner are forming the baby. The female genes determine whether the baby is a boy or a girl. At 6-8 weeks, the eyes and face are formed, and the heartbeat can be seen on ultrasound. At the end of 12 weeks, all the baby's organs are formed.  Now that you are pregnant, you will want to do everything you can to have a healthy baby. Two of the most important things are to get good prenatal care and to follow your health care provider's instructions. Prenatal care is all the medical care you receive before the baby's birth. This care will help prevent, find, and treat any problems during  the pregnancy and childbirth. BODY CHANGES Your body goes through many changes during pregnancy. The changes vary from woman to woman.   You may gain or lose a couple of pounds at first.  You may feel sick to your stomach (nauseous) and throw up (vomit). If the vomiting is uncontrollable, call your health care provider.  You may tire easily.  You may develop headaches that can be relieved by medicines approved by your health care provider.  You may urinate more often. Painful urination may mean you have a bladder infection.  You may develop heartburn as a result of your  pregnancy.  You may develop constipation because certain hormones are causing the muscles that push waste through your intestines to slow down.  You may develop hemorrhoids or swollen, bulging veins (varicose veins).  Your breasts may begin to grow larger and become tender. Your nipples may stick out more, and the tissue that surrounds them (areola) may become darker.  Your gums may bleed and may be sensitive to brushing and flossing.  Dark spots or blotches (chloasma, mask of pregnancy) may develop on your face. This will likely fade after the baby is born.  Your menstrual periods will stop.  You may have a loss of appetite.  You may develop cravings for certain kinds of food.  You may have changes in your emotions from day to day, such as being excited to be pregnant or being concerned that something may go wrong with the pregnancy and baby.  You may have more vivid and strange dreams.  You may have changes in your hair. These can include thickening of your hair, rapid growth, and changes in texture. Some women also have hair loss during or after pregnancy, or hair that feels dry or thin. Your hair will most likely return to normal after your baby is born. WHAT TO EXPECT AT YOUR PRENATAL VISITS During a routine prenatal visit:  You will be weighed to make sure you and the baby are growing normally.  Your blood pressure will be taken.  Your abdomen will be measured to track your baby's growth.  The fetal heartbeat will be listened to starting around week 10 or 12 of your pregnancy.  Test results from any previous visits will be discussed. Your health care provider may ask you:  How you are feeling.  If you are feeling the baby move.  If you have had any abnormal symptoms, such as leaking fluid, bleeding, severe headaches, or abdominal cramping.  If you are using any tobacco products, including cigarettes, chewing tobacco, and electronic cigarettes.  If you have any  questions. Other tests that may be performed during your first trimester include:  Blood tests to find your blood type and to check for the presence of any previous infections. They will also be used to check for low iron levels (anemia) and Rh antibodies. Later in the pregnancy, blood tests for diabetes will be done along with other tests if problems develop.  Urine tests to check for infections, diabetes, or protein in the urine.  An ultrasound to confirm the proper growth and development of the baby.  An amniocentesis to check for possible genetic problems.  Fetal screens for spina bifida and Down syndrome.  You may need other tests to make sure you and the baby are doing well.  HIV (human immunodeficiency virus) testing. Routine prenatal testing includes screening for HIV, unless you choose not to have this test. HOME CARE INSTRUCTIONS  Medicines  Follow your  health care provider's instructions regarding medicine use. Specific medicines may be either safe or unsafe to take during pregnancy.  Take your prenatal vitamins as directed.  If you develop constipation, try taking a stool softener if your health care provider approves. Diet  Eat regular, well-balanced meals. Choose a variety of foods, such as meat or vegetable-based protein, fish, milk and low-fat dairy products, vegetables, fruits, and whole grain breads and cereals. Your health care provider will help you determine the amount of weight gain that is right for you.  Avoid raw meat and uncooked cheese. These carry germs that can cause birth defects in the baby.  Eating four or five small meals rather than three large meals a day may help relieve nausea and vomiting. If you start to feel nauseous, eating a few soda crackers can be helpful. Drinking liquids between meals instead of during meals also seems to help nausea and vomiting.  If you develop constipation, eat more high-fiber foods, such as fresh vegetables or fruit  and whole grains. Drink enough fluids to keep your urine clear or pale yellow. Activity and Exercise  Exercise only as directed by your health care provider. Exercising will help you:  Control your weight.  Stay in shape.  Be prepared for labor and delivery.  Experiencing pain or cramping in the lower abdomen or low back is a good sign that you should stop exercising. Check with your health care provider before continuing normal exercises.  Try to avoid standing for long periods of time. Move your legs often if you must stand in one place for a long time.  Avoid heavy lifting.  Wear low-heeled shoes, and practice good posture.  You may continue to have sex unless your health care provider directs you otherwise. Relief of Pain or Discomfort  Wear a good support bra for breast tenderness.   Take warm sitz baths to soothe any pain or discomfort caused by hemorrhoids. Use hemorrhoid cream if your health care provider approves.   Rest with your legs elevated if you have leg cramps or low back pain.  If you develop varicose veins in your legs, wear support hose. Elevate your feet for 15 minutes, 3-4 times a day. Limit salt in your diet. Prenatal Care  Schedule your prenatal visits by the twelfth week of pregnancy. They are usually scheduled monthly at first, then more often in the last 2 months before delivery.  Write down your questions. Take them to your prenatal visits.  Keep all your prenatal visits as directed by your health care provider. Safety  Wear your seat belt at all times when driving.  Make a list of emergency phone numbers, including numbers for family, friends, the hospital, and police and fire departments. General Tips  Ask your health care provider for a referral to a local prenatal education class. Begin classes no later than at the beginning of month 6 of your pregnancy.  Ask for help if you have counseling or nutritional needs during pregnancy. Your  health care provider can offer advice or refer you to specialists for help with various needs.  Do not use hot tubs, steam rooms, or saunas.  Do not douche or use tampons or scented sanitary pads.  Do not cross your legs for long periods of time.  Avoid cat litter boxes and soil used by cats. These carry germs that can cause birth defects in the baby and possibly loss of the fetus by miscarriage or stillbirth.  Avoid all smoking, herbs, alcohol,  and medicines not prescribed by your health care provider. Chemicals in these affect the formation and growth of the baby.  Do not use any tobacco products, including cigarettes, chewing tobacco, and electronic cigarettes. If you need help quitting, ask your health care provider. You may receive counseling support and other resources to help you quit.  Schedule a dentist appointment. At home, brush your teeth with a soft toothbrush and be gentle when you floss. SEEK MEDICAL CARE IF:   You have dizziness.  You have mild pelvic cramps, pelvic pressure, or nagging pain in the abdominal area.  You have persistent nausea, vomiting, or diarrhea.  You have a bad smelling vaginal discharge.  You have pain with urination.  You notice increased swelling in your face, hands, legs, or ankles. SEEK IMMEDIATE MEDICAL CARE IF:   You have a fever.  You are leaking fluid from your vagina.  You have spotting or bleeding from your vagina.  You have severe abdominal cramping or pain.  You have rapid weight gain or loss.  You vomit blood or material that looks like coffee grounds.  You are exposed to [place] measles and have never had them.  You are exposed to fifth disease or chickenpox.  You develop a severe headache.  You have shortness of breath.  You have any kind of trauma, such as from a fall or a car accident.   This information is not intended to replace advice given to you by your health care provider. Make sure you discuss any  questions you have with your health care provider.   Document Released: 11/28/2001 Document Revised: 12/25/2014 Document Reviewed: 10/14/2013 Elsevier Interactive Patient Education [employer]

## 2015-09-22 NOTE — MAU Provider Note (Signed)
[name] [name] is a 29 y.o. female [phone] at [redacted]w[redacted]d here today for a follow up US. She is without complaints today   US Ob Transvaginal  09/22/2015   CLINICAL DATA:  Follow-up viability.  EXAM: TRANSVAGINAL OB ULTRASOUND  TECHNIQUE: Transvaginal ultrasound was performed for complete evaluation of the gestation as well as the maternal uterus, adnexal regions, and pelvic cul-de-sac.  COMPARISON:  09/08/2015  FINDINGS: Intrauterine gestational sac: Visualized/normal in shape.  Yolk sac:  Visualized  Embryo:  Visualized  Cardiac Activity: Visualized  Heart Rate: 138 bpm  MSD:   mm    w     d  CRL:   11  mm   7 w 1 d                  [name] EDC: 05/09/2016  Maternal uterus/adnexae: Small subchorionic hemorrhage. Left corpus luteal cyst. No adnexal masses. Small amount of free fluid in the pelvis.  IMPRESSION: Seven week 1 day intrauterine pregnancy. Fetal heart rate 138 beats per minute. Small subchorionic hemorrhage.   Electronically Signed   By: Charlett Nose M.D.   On: 09/22/2015 11:57   Plan: Discussed the importance of daily prenatal vitamins Start prenatal care ASAP  [name] picture given to the patient.    Duane Lope, NP 09/22/2015 1:08 PM

## 2015-12-19 NOTE — L&D Delivery Note (Signed)
Delivery Note  Patient presented in active labor after SROM at home. She had a precipitous course.  At 4:25 AM a viable female was delivered via Vaginal, Spontaneous Delivery (Presentation: Right Occiput Anterior).  APGAR: 8, 9; weight 2570 g .   Placenta status: Intact, Spontaneous.  Cord: 3 vessels with the following complications: None.  Cord pH: not obtained  Anesthesia: None  Episiotomy: None Lacerations: None Est. Blood Loss (mL): 250  Mom to postpartum.  Baby to Couplet care / Skin to Skin.  [name] [name] 04/27/2016, 4:44 AM

## 2016-04-27 ENCOUNTER — Encounter (HOSPITAL_COMMUNITY): Payer: Self-pay | Admitting: *Deleted

## 2016-04-27 ENCOUNTER — Inpatient Hospital Stay (HOSPITAL_COMMUNITY)
Admission: AD | Admit: 2016-04-27 | Discharge: 2016-04-29 | DRG: 775 | Disposition: A | Discharge status: Home / Self Care | Payer: Medicaid Other | Source: Ambulatory Visit | Attending: Obstetrics and Gynecology | Admitting: Obstetrics and Gynecology

## 2016-04-27 DIAGNOSIS — Z8249 Family history of ischemic heart disease and other diseases of the circulatory system: Secondary | ICD-10-CM

## 2016-04-27 DIAGNOSIS — Z3A39 39 weeks gestation of pregnancy: Secondary | ICD-10-CM

## 2016-04-27 DIAGNOSIS — Z720 Tobacco use: Secondary | ICD-10-CM | POA: Diagnosis present

## 2016-04-27 DIAGNOSIS — F1290 Cannabis use, unspecified, uncomplicated: Secondary | ICD-10-CM | POA: Diagnosis present

## 2016-04-27 DIAGNOSIS — F418 Other specified anxiety disorders: Secondary | ICD-10-CM | POA: Diagnosis present

## 2016-04-27 DIAGNOSIS — Z833 Family history of diabetes mellitus: Secondary | ICD-10-CM

## 2016-04-27 DIAGNOSIS — F322 Major depressive disorder, single episode, severe without psychotic features: Secondary | ICD-10-CM | POA: Diagnosis present

## 2016-04-27 DIAGNOSIS — O99334 Smoking (tobacco) complicating childbirth: Secondary | ICD-10-CM | POA: Diagnosis not present

## 2016-04-27 DIAGNOSIS — F1490 Cocaine use, unspecified, uncomplicated: Secondary | ICD-10-CM | POA: Diagnosis present

## 2016-04-27 DIAGNOSIS — O99344 Other mental disorders complicating childbirth: Principal | ICD-10-CM | POA: Diagnosis present

## 2016-04-27 DIAGNOSIS — Z8261 Family history of arthritis: Secondary | ICD-10-CM | POA: Diagnosis not present

## 2016-04-27 DIAGNOSIS — F17210 Nicotine dependence, cigarettes, uncomplicated: Secondary | ICD-10-CM | POA: Diagnosis present

## 2016-04-27 DIAGNOSIS — IMO0001 Reserved for inherently not codable concepts without codable children: Secondary | ICD-10-CM

## 2016-04-27 DIAGNOSIS — O99324 Drug use complicating childbirth: Secondary | ICD-10-CM | POA: Diagnosis present

## 2016-04-27 DIAGNOSIS — F1410 Cocaine abuse, uncomplicated: Secondary | ICD-10-CM | POA: Diagnosis present

## 2016-04-27 DIAGNOSIS — O99323 Drug use complicating pregnancy, third trimester: Secondary | ICD-10-CM

## 2016-04-27 LAB — CBC
HEMATOCRIT: 28.8 % — AB (ref 36.0–46.0)
Hemoglobin: 8.5 g/dL — ABNORMAL LOW (ref 12.0–15.0)
MCH: 18.2 pg — AB (ref 26.0–34.0)
MCHC: 29.5 g/dL — ABNORMAL LOW (ref 30.0–36.0)
MCV: 61.8 fL — AB (ref 78.0–100.0)
Platelets: 198 10*3/uL (ref 150–400)
RBC: 4.66 MIL/uL (ref 3.87–5.11)
RDW: 22.9 % — AB (ref 11.5–15.5)
WBC: 7.6 10*3/uL (ref 4.0–10.5)

## 2016-04-27 LAB — COMPREHENSIVE METABOLIC PANEL
ALT: 14 U/L (ref 14–54)
AST: 16 U/L (ref 15–41)
Albumin: 2.5 g/dL — ABNORMAL LOW (ref 3.5–5.0)
Alkaline Phosphatase: 180 U/L — ABNORMAL HIGH (ref 38–126)
Anion gap: 7 (ref 5–15)
BUN: 6 mg/dL (ref 6–20)
CHLORIDE: 108 mmol/L (ref 101–111)
CO2: 20 mmol/L — ABNORMAL LOW (ref 22–32)
Calcium: 8.6 mg/dL — ABNORMAL LOW (ref 8.9–10.3)
Creatinine, Ser: 0.47 mg/dL (ref 0.44–1.00)
GFR calc Af Amer: >60 mL/min (ref >60)
Glucose, Bld: 74 mg/dL (ref 65–99)
POTASSIUM: 3.8 mmol/L (ref 3.5–5.1)
SODIUM: 135 mmol/L (ref 135–145)
Total Bilirubin: 0.3 mg/dL (ref 0.3–1.2)
Total Protein: 5.6 g/dL — ABNORMAL LOW (ref 6.5–8.1)

## 2016-04-27 LAB — RAPID URINE DRUG SCREEN, HOSP PERFORMED
Amphetamines: NOT DETECTED
BARBITURATES: NOT DETECTED
BENZODIAZEPINES: NOT DETECTED
COCAINE: POSITIVE — AB
Opiates: NOT DETECTED
TETRAHYDROCANNABINOL: POSITIVE — AB

## 2016-04-27 LAB — PROTEIN / CREATININE RATIO, URINE
CREATININE, URINE: 54 mg/dL
Protein Creatinine Ratio: 0.13 mg/mg{Cre} (ref 0.00–0.15)
Total Protein, Urine: 7 mg/dL

## 2016-04-27 LAB — HEPATITIS B SURFACE ANTIGEN: Hepatitis B Surface Ag: NEGATIVE

## 2016-04-27 LAB — TYPE AND SCREEN
ABO/RH(D): O POS
Antibody Screen: NEGATIVE

## 2016-04-27 LAB — RPR: RPR Ser Ql: NONREACTIVE

## 2016-04-27 LAB — GROUP B STREP BY PCR: Group B strep by PCR: POSITIVE — AB

## 2016-04-27 LAB — ABO/RH: ABO/RH(D): O POS

## 2016-04-27 MED ORDER — LIDOCAINE HCL (PF) 1 % IJ SOLN
INTRAMUSCULAR | Status: DC
Start: 2016-04-27 — End: 2016-04-27
  Filled 2016-04-27: qty 30

## 2016-04-27 MED ORDER — DIBUCAINE 1 % RE OINT
1.0000 | TOPICAL_OINTMENT | RECTAL | Status: DC | PRN
Start: 2016-04-27 — End: 2016-04-29

## 2016-04-27 MED ORDER — ONDANSETRON HCL 4 MG PO TABS
4.0000 mg | ORAL_TABLET | ORAL | Status: DC | PRN
Start: 2016-04-27 — End: 2016-04-29

## 2016-04-27 MED ORDER — ACETAMINOPHEN 325 MG PO TABS: 650.0000 mg | ORAL_TABLET | ORAL | Status: DC | PRN

## 2016-04-27 MED ORDER — OXYTOCIN 10 UNIT/ML IJ SOLN: 2.5000 [IU]/h | INTRAVENOUS | Status: DC

## 2016-04-27 MED ORDER — ONDANSETRON HCL 4 MG/2ML IJ SOLN: 4.0000 mg | INTRAMUSCULAR | Status: DC | PRN

## 2016-04-27 MED ORDER — LIDOCAINE HCL (PF) 1 % IJ SOLN
30.0000 mL | INTRAMUSCULAR | Status: DC | PRN
  Filled 2016-04-27: qty 30

## 2016-04-27 MED ORDER — OXYTOCIN BOLUS FROM INFUSION
500.0000 mL | INTRAVENOUS | Status: DC
  Administered 2016-04-27: 500 mL via INTRAVENOUS

## 2016-04-27 MED ORDER — DIPHENHYDRAMINE HCL 25 MG PO CAPS: 25.0000 mg | ORAL_CAPSULE | Freq: Four times a day (QID) | ORAL | Status: DC | PRN

## 2016-04-27 MED ORDER — IBUPROFEN 600 MG PO TABS
600.0000 mg | ORAL_TABLET | Freq: Four times a day (QID) | ORAL | Status: DC
  Administered 2016-04-27 – 2016-04-29 (×8): 600 mg via ORAL
  Filled 2016-04-27 (×8): qty 1

## 2016-04-27 MED ORDER — LACTATED RINGERS IV SOLN
INTRAVENOUS | Status: DC
  Administered 2016-04-27: 04:00:00 via INTRAVENOUS

## 2016-04-27 MED ORDER — SIMETHICONE 80 MG PO CHEW
80.0000 mg | CHEWABLE_TABLET | ORAL | Status: DC | PRN
Start: 2016-04-27 — End: 2016-04-29

## 2016-04-27 MED ORDER — TETANUS-DIPHTH-ACELL PERTUSSIS 5-2.5-18.5 LF-MCG/0.5 IM SUSP: 0.5000 mL | Freq: Once | INTRAMUSCULAR | Status: DC

## 2016-04-27 MED ORDER — PRENATAL MULTIVITAMIN CH
1.0000 | ORAL_TABLET | Freq: Every day | ORAL | Status: DC
  Administered 2016-04-27 – 2016-04-28 (×2): 1 via ORAL
  Filled 2016-04-27 (×2): qty 1

## 2016-04-27 MED ORDER — CITRIC ACID-SODIUM CITRATE 334-500 MG/5ML PO SOLN: 30.0000 mL | ORAL | Status: DC | PRN

## 2016-04-27 MED ORDER — FENTANYL CITRATE (PF) 100 MCG/2ML IJ SOLN: 100.0000 ug | INTRAMUSCULAR | Status: DC | PRN

## 2016-04-27 MED ORDER — WITCH HAZEL-GLYCERIN EX PADS: 1.0000 "application " | MEDICATED_PAD | CUTANEOUS | Status: DC | PRN

## 2016-04-27 MED ORDER — LACTATED RINGERS IV SOLN: 500.0000 mL | INTRAVENOUS | Status: DC | PRN

## 2016-04-27 MED ORDER — OXYTOCIN 10 UNIT/ML IJ SOLN
INTRAVENOUS | Status: AC
  Filled 2016-04-27: qty 4

## 2016-04-27 MED ORDER — ACETAMINOPHEN 325 MG PO TABS
650.0000 mg | ORAL_TABLET | ORAL | Status: DC | PRN
Start: 2016-04-27 — End: 2016-04-29

## 2016-04-27 MED ORDER — ONDANSETRON HCL 4 MG/2ML IJ SOLN: 4.0000 mg | Freq: Four times a day (QID) | INTRAMUSCULAR | Status: DC | PRN

## 2016-04-27 MED ORDER — SENNOSIDES-DOCUSATE SODIUM 8.6-50 MG PO TABS
2.0000 | ORAL_TABLET | ORAL | Status: DC
  Administered 2016-04-28 – 2016-04-29 (×2): 2 via ORAL
  Filled 2016-04-27 (×2): qty 2

## 2016-04-27 MED ORDER — MEASLES, MUMPS & RUBELLA VAC ~~LOC~~ INJ
0.5000 mL | INJECTION | Freq: Once | SUBCUTANEOUS | Status: DC
  Filled 2016-04-27: qty 0.5

## 2016-04-27 MED ORDER — FERROUS SULFATE 325 (65 FE) MG PO TABS
325.0000 mg | ORAL_TABLET | Freq: Two times a day (BID) | ORAL | Status: DC
  Administered 2016-04-27 – 2016-04-28 (×4): 325 mg via ORAL
  Filled 2016-04-27 (×4): qty 1

## 2016-04-27 MED ORDER — COCONUT OIL OIL
1.0000 | TOPICAL_OIL | Status: DC | PRN
Start: 2016-04-27 — End: 2016-04-29

## 2016-04-27 MED ORDER — BENZOCAINE-MENTHOL 20-0.5 % EX AERO
1.0000 | INHALATION_SPRAY | CUTANEOUS | Status: DC | PRN
Start: 2016-04-27 — End: 2016-04-29
  Administered 2016-04-28: 1 via TOPICAL
  Filled 2016-04-27: qty 56

## 2016-04-27 MED ORDER — ZOLPIDEM TARTRATE 5 MG PO TABS: 5.0000 mg | ORAL_TABLET | Freq: Every evening | ORAL | Status: DC | PRN

## 2016-04-27 NOTE — Progress Notes (Signed)
UR chart review completed.  

## 2016-04-27 NOTE — MAU Note (Signed)
Pt presents with complaint of ROM and contractions, pt had large amount of thick mec and having to breathe with contractions. Pt has history of drug use and states she last used one week ago. No prenatal care.

## 2016-04-27 NOTE — Lactation Note (Signed)
This note was copied from a baby's chart. Lactation Consultation Note  Patient Name: [name] [name] Today's Date: 04/27/2016 Reason for consult: Initial assessment  Visited with Mom, baby 6 hrs old.  Mom with little PNC, and [facility].  Pediatrician (Dr. Margo AyeHall) spoke with Mom on phone due to numerous family in room.  Pediatrician discussed results of UDS and stated she should not breast feed.  Offered to have Lactation come and set up a pump so she can preserve her milk supply until she tests negative.  Mom agreed.  When LC went in room, Mom was attempting to breast feed.  Asked people in the room to leave so I could talk with Mom.  Reinforced what Pediatrician had recently told Mom about not breast feeding with being positive for cocaine.  Talked about importance of giving baby formula until she is not using illegal drugs, and her UDS is negative.  Mom states she is in a drug program. Handout given on Marijuana and Breastfeeding, The Harmful Effects.  Mom stated that she was just interested in baby getting her colostrum.  Explained the routine of pumping and discarding milk, and the need to pump >8 times in 24 hrs.  Mom's RN aware of plan formula feed, and to have Mom pump and discard any EBM. Brochure given.  Information on IP and OP lactation services shared.     Consult Status Consult Status: Follow-up Date: 04/28/16 Follow-up type: In-patient    [name] [name], [name] [name] E 04/27/2016, 11:08 AM

## 2016-04-27 NOTE — H&P (Signed)
LABOR AND DELIVERY ADMISSION HISTORY AND PHYSICAL NOTE  [name] [name] is a 30 y.o. female [phone] with IUP at [redacted]w[redacted]d by L/7 presenting for contractions with srom at 02:30 this morning.   She reports positive fetal movement. She denies leakage of fluid or vaginal bleeding.  Prenatal History/Complications:  Past Medical History: Past Medical History  Diagnosis Date  . Anemia   . Abscess of axilla   . Hemorrhoids   . Blood transfusion without reported diagnosis   . Heart murmur   . Substance abuse   . Headache(784.0)   . Anxiety   . Depression     bipolar; not on meds    Past Surgical History: Past Surgical History  Procedure Laterality Date  . No past surgeries      Obstetrical History: OB History    Gravida Para Term Preterm AB TAB SAB Ectopic Multiple Living   0 1 0 0 2      Social History: Social History   Social History  . Marital Status: Single    Spouse Name: N/A  . Number of Children: N/A  . Years of Education: N/A   Social History Main Topics  . Smoking status: Current Every Day Smoker -- 0.50 packs/day for 9 years    Types: Cigarettes  . Smokeless tobacco: Never Used  . Alcohol Use: Yes     Comment: rare  . Drug Use: 3.00 per week    Special: Marijuana, Cocaine  . Sexual Activity: Yes    Birth Control/ Protection: None   Other Topics Concern  . None   Social History Narrative    Family History: Family History  Problem Relation Age of Onset  . Hypertension Mother   . Diabetes type II Mother   . Arthritis Mother   . Hyperlipidemia Mother   . Cancer Father     lung    Allergies: Allergies  Allergen Reactions  . Amoxicillin Nausea And Vomiting  . Penicillins Nausea And Vomiting    Prescriptions prior to admission  Medication Sig Dispense Refill Last Dose  . Ascorbic Acid (VITAMIN C PO) Take 1 tablet by mouth daily.   Unknown at Unknown time  . metroNIDAZOLE (FLAGYL) 500 MG tablet Take 1 tablet (500 mg total) by mouth 2  (two) times daily. 14 tablet 0   . promethazine (PHENERGAN) 25 MG tablet Take 0.5 tablets (12.5 mg total) by mouth every 6 (six) hours as needed. 20 tablet 0   . terconazole (TERAZOL 7) 0.4 % vaginal cream Place 1 applicator vaginally at bedtime. 45 g 0      Review of Systems   All systems reviewed and negative except as stated in HPI  Last menstrual period 07/29/2015. General appearance: alert, cooperative, appears stated age and moderate distress Lungs: clear to auscultation bilaterally Heart: regular rate and rhythm Abdomen: soft, non-tender; bowel sounds normal Extremities: No calf swelling or tenderness Presentation: cephalic per rn exam Fetal monitoring: 150/mod/-a/early decels Uterine activity: q 2-3 min  Dilation: 4.5 Effacement (%): 90 Station: 0 Exam by:: Nancee Liter RN    Prenatal labs: ABO, Rh:  pending Antibody:  pending Rubella: !Error!pending RPR: Non Reactive (09/21 1721)  HBsAg:   pending HIV: Non Reactive (09/21 1721)  GBS:   pending 1 hr Glucola: not performed Genetic screening:  declined Anatomy US: normal per patient @ Education administrator Tool  Maternal Diabetes: unknown Genetic Screening: Declined Maternal Ultrasounds/Referrals: Normal Fetal Ultrasounds or other Referrals:  None Maternal  Substance Abuse:  Yes:  Type: Cocaine Significant Maternal Medications:  None Significant Maternal Lab Results: gbs unknown  No results found for this or any previous visit (from the past 24 hour(s)).  Patient Active Problem List   Diagnosis Date Noted  . Drug overdose 06/19/2012  . Suicide attempt (HCC) 06/19/2012  . UTI (lower urinary tract infection) 06/19/2012  . Microcytic anemia 06/17/2012  . Hypokalemia 06/17/2012  . 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  . Tobacco abuse 06/17/2012    Assessment: [name] [name] is a 30 y.o. [id number] at 4257w0d here for SOL with SROm.  #Labor: expectant #Pain: Iv fentanyl, possible  epidural #FWB: Cat 1 #ID:  gbs unknown, pcr pending #MOF: br #MOC: pop, then ocp #Circ:  N/a #Scant prenatal care, history drug use: f/u uds, SW consult, hep [name], and rubella  [name] [name] [name] [name] 04/27/2016, 3:47 AM

## 2016-04-28 LAB — RUBELLA SCREEN
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
## 2016-04-28 NOTE — Progress Notes (Signed)
CPS case has been assigned to worker Maralyn SagoSarah [phone].  CSW left message for worker and will attempt to have her reached by CPS Intake worker if call is not returned within the hour.

## 2016-04-28 NOTE — Progress Notes (Signed)
Post Partum Day 1 Subjective: no complaints, up ad lib, voiding and tolerating PO, small lochia, plans to bottle feed, oral contraceptives (estrogen/progesterone)  Objective: Blood pressure 133/90, pulse 77, temperature 98.2 F (36.8 C), temperature source Oral, resp. rate 18, height 5\' 5"  (1.651 m), weight 62.596 kg (138 lb), last menstrual period 07/29/2015, SpO2 100 %, unknown if currently breastfeeding.  Physical Exam:  General: alert, cooperative and no distress Lochia:normal flow Chest: CTAB Heart: RRR no m/r/g Abdomen: +BS, soft, nontender,  Uterine Fundus: firm DVT Evaluation: No evidence of DVT seen on physical exam. Extremities: no edema   Recent Labs  04/27/16 0345  HGB 8.5*  HCT 28.8*    Assessment/Plan: Plan for discharge tomorrow and Social Work consult   LOS: 1 day   [name],[name] Devaux 04/28/2016, 7:09 AM

## 2016-04-28 NOTE — Progress Notes (Signed)
CSW spoke with CPS worker/S. Mariann LasterBowden who states either [facility] will come to hospital to meet with MOB tonight or Ms. Mariann LasterBowden will come meet with MOB in the morning prior to 11am discharge.  CSW informed her that baby should be medically ready for discharge tomorrow barring any changes in her medical status per RN report.  CSW gave weekend contact number and CSW name to follow up with regarding disposition plan.

## 2016-04-28 NOTE — Progress Notes (Signed)
CSW has met with MOB to complete assessment and offer support.  CPS report made at 10am.  Full documentation to follow.

## 2016-04-29 MED ORDER — IBUPROFEN 600 MG PO TABS: 600.0000 mg | ORAL_TABLET | Freq: Four times a day (QID) | ORAL | Status: DC

## 2016-04-29 MED ORDER — MEASLES, MUMPS & RUBELLA VAC ~~LOC~~ INJ
0.5000 mL | INJECTION | Freq: Once | SUBCUTANEOUS | Status: DC
  Filled 2016-04-29: qty 0.5

## 2016-04-29 NOTE — Progress Notes (Signed)
Clinical Social Work  CSW spoke with CPS worker (Sarah Bowden #[phone]) who evaluated MOB in hospital room. CPS completed evaluation and reports that baby can DC home with MOB when medically stable. CPS reports she can be contacted with any further questions.  CSW alerted MD (Dr. Hall) of no barriers to DC.  Kerah Hardebeck, LCSW Weekend Coverage 

## 2016-04-29 NOTE — Progress Notes (Signed)
Mother informed of risk of not getting Rubella shot. She wishes to get it in office. Risks factors explained of not getting the vaccine. CPS will be here by 11am per Child psychotherapistocial Worker.
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
## 2016-05-28 ENCOUNTER — Emergency Department (HOSPITAL_COMMUNITY)
Admission: EM | Admit: 2016-05-28 | Discharge: 2016-05-28 | Disposition: A | Discharge status: Home / Self Care | Payer: Medicaid Other | Attending: Emergency Medicine | Admitting: Emergency Medicine

## 2016-05-28 ENCOUNTER — Encounter (HOSPITAL_COMMUNITY): Payer: Self-pay | Admitting: *Deleted

## 2016-05-28 DIAGNOSIS — J029 Acute pharyngitis, unspecified: Secondary | ICD-10-CM | POA: Diagnosis present

## 2016-05-28 DIAGNOSIS — F329 Major depressive disorder, single episode, unspecified: Secondary | ICD-10-CM | POA: Insufficient documentation

## 2016-05-28 DIAGNOSIS — F17210 Nicotine dependence, cigarettes, uncomplicated: Secondary | ICD-10-CM | POA: Diagnosis not present

## 2016-05-28 DIAGNOSIS — J020 Streptococcal pharyngitis: Secondary | ICD-10-CM | POA: Insufficient documentation

## 2016-05-28 LAB — RAPID STREP SCREEN (MED CTR MEBANE ONLY): STREPTOCOCCUS, GROUP A SCREEN (DIRECT): POSITIVE — AB

## 2016-05-28 MED ORDER — AZITHROMYCIN 500 MG PO TABS: 500.0000 mg | ORAL_TABLET | Freq: Every day | ORAL | Status: DC

## 2016-05-28 MED ORDER — IBUPROFEN 400 MG PO TABS
400.0000 mg | ORAL_TABLET | Freq: Once | ORAL | Status: AC
  Administered 2016-05-28: 400 mg via ORAL
  Filled 2016-05-28: qty 1

## 2016-05-28 MED ORDER — AZITHROMYCIN 250 MG PO TABS
500.0000 mg | ORAL_TABLET | Freq: Once | ORAL | Status: AC
  Administered 2016-05-28: 500 mg via ORAL
  Filled 2016-05-28: qty 2

## 2016-05-28 NOTE — ED Notes (Addendum)
Mom with sore throat x 2 days, hx drug use last marijuana and cocaine approx 2-3 days ago, here with 734 week old daughter, GBS + untreated at delivery

## 2016-05-28 NOTE — ED Notes (Signed)
Declined W/C at D/C and was escorted to lobby by RN. 

## 2016-05-28 NOTE — ED Notes (Signed)
Patient just came to front desk stating she was in pediatric ER with child and is now ready to be soon

## 2016-05-28 NOTE — ED Provider Notes (Signed)
CSN: [id number]     Arrival date & time 05/28/16  1008 History   First MD Initiated Contact with Patient 05/28/16 1349     Chief Complaint  Patient presents with  . Sore Throat   (Consider location/radiation/quality/duration/timing/severity/associated sxs/prior Treatment) HPI 30 y.o. female presents to the Emergency Department today complaining of sore throat since Wednesday. Associated sinus congestion, rhinorrhea. States pain is 7/10 and throbbing. No cough. No CP/SOB/ABD pain. No N/V/D. No fevers. Pt has daughter with similar symptoms. No trouble swallowing. No difficulty with neck ROM. No neck stiffness.   Past Medical History  Diagnosis Date  . Anemia   . Abscess of axilla   . Hemorrhoids   . Blood transfusion without reported diagnosis   . Heart murmur   . Substance abuse   . Headache(784.0)   . Anxiety   . Depression     bipolar; not on meds   Past Surgical History  Procedure Laterality Date  . No past surgeries     Family History  Problem Relation Age of Onset  . Hypertension Mother   . Diabetes type II Mother   . Arthritis Mother   . Hyperlipidemia Mother   . Cancer Father     lung   Social History  Substance Use Topics  . Smoking status: Current Every Day Smoker -- 0.50 packs/day for 9 years    Types: Cigarettes  . Smokeless tobacco: Never Used  . Alcohol Use: Yes     Comment: rare   OB History    Gravida Para Term Preterm AB TAB SAB Ectopic Multiple Living   '4 3 2 1 1 ' 0 1 0 0 3     Review of Systems  Constitutional: Negative for fever.  HENT: Positive for congestion, rhinorrhea and sore throat. Negative for sinus pressure, trouble swallowing and voice change.   Musculoskeletal: Negative for neck pain and neck stiffness.   Allergies  Amoxicillin and Penicillins  Home Medications   Prior to Admission medications   Medication Sig Start Date End Date Taking? Authorizing Provider  azithromycin (ZITHROMAX) 500 MG tablet Take 1 tablet (500 mg total)  by mouth daily. Take first 2 tablets together, then 1 every day until finished. 05/28/16   Shary Decamp, PA-C  ferrous sulfate 325 (65 FE) MG tablet Take 325 mg by mouth daily with breakfast.    Historical Provider, MD  ibuprofen (ADVIL,MOTRIN) 600 MG tablet Take 1 tablet (600 mg total) by mouth every 6 (six) hours. 04/29/16   Caren Macadam, MD   BP 129/88 mmHg  Pulse 79  Temp(Src) 99 F (37.2 C) (Oral)  Resp 16  SpO2 100%  Breastfeeding? Unknown Physical Exam  Constitutional: She is oriented to person, place, and time. She appears well-developed and well-nourished. No distress.  HENT:  Head: Normocephalic and atraumatic.  Right Ear: Tympanic membrane, external ear and ear canal normal.  Left Ear: Tympanic membrane, external ear and ear canal normal.  Nose: Nose normal.  Mouth/Throat: Uvula is midline and mucous membranes are normal. No trismus in the jaw. No uvula swelling. Oropharyngeal exudate and posterior oropharyngeal erythema present. No tonsillar abscesses.  Full ROM of neck without difficulty. No trismus. No uvula deviation.    Eyes: EOM are normal. Pupils are equal, round, and reactive to light.  Neck: Normal range of motion. Neck supple. No tracheal deviation present.  Cardiovascular: Normal rate, regular rhythm, S1 normal, S2 normal, normal heart sounds, intact distal pulses and normal pulses.   Pulmonary/Chest: Effort normal and  breath sounds normal. No respiratory distress. She has no decreased breath sounds. She has no wheezes. She has no rhonchi. She has no rales.  Abdominal: Normal appearance and bowel sounds are normal. There is no tenderness.  Musculoskeletal: Normal range of motion.  Neurological: She is alert and oriented to person, place, and time.  Skin: Skin is warm and dry.  Psychiatric: She has a normal mood and affect. Her speech is normal and behavior is normal. Thought content normal.    ED Course  Procedures (including critical care time) Labs  Review Labs Reviewed  RAPID STREP SCREEN (NOT AT [facility]) - Abnormal; Notable for the following:    Streptococcus, Group A Screen (Direct) POSITIVE (*)    All other components within normal limits    Imaging Review No results found. I have personally reviewed and evaluated these images and lab results as part of my medical decision-making.   EKG Interpretation None      MDM  I have reviewed and evaluated the relevant laboratory valuesI have reviewed the relevant previous healthcare records. I obtained HPI from historian.  ED Course:  Assessment: Two of four CENTOR criteria met  (__) Fever (X) Cervical adenopathy (X) Tonsillar/pharyngeal exudate (__) No cough   Strep screen positive.  Antibiotics prescribed. No Trismus. No neck stiffness. Full ROM of neck without pain. No uvula deviation. At time of discharge, Patient is in no acute distress. Vital Signs are stable. Patient is able to ambulate. Patient able to tolerate PO.     Disposition/Plan:  DC Home Additional Verbal discharge instructions given and discussed with patient.  Pt Instructed to f/u with PCP in the next week for evaluation and treatment of symptoms. Return precautions given Pt acknowledges and agrees with plan  Supervising Physician Veryl Speak, MD   Final diagnoses:  Strep pharyngitis     Shary Decamp, PA-C 05/28/16 1402  Veryl Speak, MD 05/28/16 1534

## 2016-05-28 NOTE — ED Notes (Signed)
Remains in peds with hr child

## 2016-05-28 NOTE — ED Notes (Signed)
Called for patient to be triaged however patient is with pediatric patient to be seen first.

## 2016-05-28 NOTE — ED Notes (Signed)
Unable to locatd Pt in lobby.

## 2016-05-28 NOTE — ED Notes (Signed)
Patient still in pediatrics with child

## 2016-05-28 NOTE — Discharge Instructions (Signed)
Please read and follow all provided instructions.  Your diagnoses today include:  1. Strep pharyngitis    Tests performed today include:  Strep test: was POSITIVE for strep throat  Vital signs. See below for your results today.   Medications prescribed:   Take any medications prescribed only as directed.   Home care instructions:  Please read the educational materials provided and follow any instructions contained in this packet.  Follow-up instructions: Please follow-up with your primary care provider as needed for further evaluation of your symptoms.  Return instructions:   Please return to the Emergency Department if you experience worsening symptoms.   Return if you have worsening problems swallowing, your neck becomes swollen, you cannot swallow your saliva or your voice becomes muffled.   Return with high persistent fever, persistent vomiting, or if you have trouble breathing.   Please return if you have any other emergent concerns.  Additional Information:  Your vital signs today were: BP 129/88 mmHg   Pulse 79   Temp(Src) 99 F (37.2 C) (Oral)   Resp 16   SpO2 100%   Breastfeeding? Unknown If your blood pressure (BP) was elevated above 135/85 this visit, please have this repeated by your doctor within one month. --------------

## 2016-05-28 NOTE — ED Notes (Signed)
Patient will be seen in fast track once pediatric patient is D/C

## 2016-05-28 NOTE — ED Notes (Signed)
PT called with no answer. Unable to locate Pt in lobby

## 2016-06-19 ENCOUNTER — Ambulatory Visit: Payer: Self-pay | Admitting: Obstetrics and Gynecology

## 2017-03-05 ENCOUNTER — Ambulatory Visit (HOSPITAL_COMMUNITY)
Admission: EM | Admit: 2017-03-05 | Discharge: 2017-03-05 | Disposition: A | Discharge status: Home / Self Care | Payer: Medicaid Other | Attending: Family Medicine | Admitting: Family Medicine

## 2017-03-05 ENCOUNTER — Encounter (HOSPITAL_COMMUNITY): Payer: Self-pay | Admitting: *Deleted

## 2017-03-05 DIAGNOSIS — R102 Pelvic and perineal pain: Secondary | ICD-10-CM | POA: Insufficient documentation

## 2017-03-05 DIAGNOSIS — F17210 Nicotine dependence, cigarettes, uncomplicated: Secondary | ICD-10-CM | POA: Diagnosis not present

## 2017-03-05 DIAGNOSIS — Z79899 Other long term (current) drug therapy: Secondary | ICD-10-CM | POA: Insufficient documentation

## 2017-03-05 DIAGNOSIS — R0981 Nasal congestion: Secondary | ICD-10-CM | POA: Diagnosis not present

## 2017-03-05 DIAGNOSIS — Z880 Allergy status to penicillin: Secondary | ICD-10-CM | POA: Insufficient documentation

## 2017-03-05 LAB — POCT URINALYSIS DIP (DEVICE)
Bilirubin Urine: NEGATIVE
Glucose, UA: NEGATIVE mg/dL
HGB URINE DIPSTICK: NEGATIVE
Ketones, ur: NEGATIVE mg/dL
LEUKOCYTES UA: NEGATIVE
NITRITE: NEGATIVE
PH: 7.5 (ref 5.0–8.0)
PROTEIN: NEGATIVE mg/dL
SPECIFIC GRAVITY, URINE: 1.02 (ref 1.005–1.030)
UROBILINOGEN UA: 1 mg/dL (ref 0.0–1.0)

## 2017-03-05 LAB — POCT I-STAT, CHEM 8
BUN: 7 mg/dL (ref 6–20)
CREATININE: 0.8 mg/dL (ref 0.44–1.00)
Calcium, Ion: 1.37 mmol/L (ref 1.15–1.40)
Chloride: 105 mmol/L (ref 101–111)
Glucose, Bld: 85 mg/dL (ref 65–99)
HEMATOCRIT: 32 % — AB (ref 36.0–46.0)
HEMOGLOBIN: 10.9 g/dL — AB (ref 12.0–15.0)
POTASSIUM: 4.5 mmol/L (ref 3.5–5.1)
SODIUM: 140 mmol/L (ref 135–145)
TCO2: 26 mmol/L (ref 0–100)

## 2017-03-05 LAB — POCT PREGNANCY, URINE: Preg Test, Ur: NEGATIVE

## 2017-03-05 LAB — TSH: TSH: 0.58 u[IU]/mL (ref 0.350–4.500)

## 2017-03-05 MED ORDER — AZITHROMYCIN 250 MG PO TABS
1000.0000 mg | ORAL_TABLET | Freq: Once | ORAL | Status: AC
  Administered 2017-03-05: 1000 mg via ORAL

## 2017-03-05 MED ORDER — AZITHROMYCIN 250 MG PO TABS
ORAL_TABLET | ORAL | Status: AC
  Filled 2017-03-05: qty 4

## 2017-03-05 NOTE — Discharge Instructions (Signed)
We are providing you antibiotics today in hopes that this will relieve your pelvic pain. There are number chest that are pending and we should have the results back by Wednesday or Thursday. We'll let you know what the results are at that time.  Many times people are very tired when they are going through rehabilitation. It's important to avoid all drug use because this will only prolong the rehabilitation. And make you more tired.

## 2017-03-05 NOTE — ED Triage Notes (Addendum)
Patient reports cold like symptoms plus cough.   States also has lower abdominal pain, bilateral, with no reason for pain after follow ups with OBGYN. States has not had period since September. Patient states she has pressure in hips and lower back as she is one her feet all day.

## 2017-03-05 NOTE — ED Provider Notes (Signed)
[facility]    CSN: [id number] Arrival date & time: 03/05/17  1349     History   Chief Complaint Chief Complaint  Patient presents with  . Nasal Congestion  . Abdominal Pain    HPI [name] [name] is a 31 y.o. female.   Patient reports cold like symptoms plus cough.   States also has lower abdominal pain, bilateral, with no reason for pain after follow ups with OBGYN. States has not had period since September. Patient states she has pressure in hips and lower back as she is one her feet all day.    This 31 year old woman works at [employer] in the afternoon and his studying for her GED in the morning. She has a 33-year-old daughter. She still using drugs, most recently yesterday, using weed and Coke. [facility]'s [facility] past and been prescribed Latuda antidepressant but it "didn't agree with me".      Past Medical History:  Diagnosis Date  . Abscess of axilla   . Anemia   . Anxiety   . Blood transfusion without reported diagnosis   . Depression    bipolar; not on meds  . Headache(784.0)   . Heart murmur   . Hemorrhoids   . Substance abuse     Patient Active Problem List   Diagnosis Date Noted  . NSVD (normal spontaneous vaginal delivery) 04/29/2016  . Active labor 04/27/2016  . 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 Drug overdose 06/19/2012  . Suicide attempt 06/19/2012  . UTI (lower urinary tract infection) 06/19/2012  . Microcytic anemia 06/17/2012  . Hypokalemia 06/17/2012  . 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  . Tobacco abuse 06/17/2012    Past Surgical History:  Procedure Laterality Date  . NO PAST SURGERIES      OB History    Gravida Para Term Preterm AB Living   4 3 2 1 1 3    SAB TAB Ectopic Multiple Live Births   1 0 0 0 3       Home Medications    Prior to Admission medications   Medication Sig Start Date End Date Taking? Authorizing Provider  ferrous sulfate  325 (65 FE) MG tablet Take 325 mg by mouth daily with breakfast.    Historical Provider, MD  ibuprofen (ADVIL,MOTRIN) 600 MG tablet Take 1 tablet (600 mg total) by mouth every 6 (six) hours. 04/29/16   Federico FlakeKimberly Niles Newton, MD    Family History Family History  Problem Relation Age of Onset  . Hypertension Mother   . Diabetes type II Mother   . Arthritis Mother   . Hyperlipidemia Mother   . Cancer Father     lung    Social History Social History  Substance Use Topics  . Smoking status: Current Every Day Smoker    Packs/day: 0.50    Years: 9.00    Types: Cigarettes  . Smokeless tobacco: Never Used  . Alcohol use Yes     Comment: rare     Allergies   Amoxicillin and Penicillins   Review of Systems Review of Systems  Constitutional: Positive for fatigue. Negative for fever.  HENT: Positive for congestion.   Gastrointestinal: Negative.   Genitourinary: Positive for pelvic pain.     Physical Exam Triage Vital Signs ED Triage Vitals [03/05/17 1438]  Enc Vitals Group     BP      Pulse      Resp  Temp      Temp src      SpO2      Weight      Height      Head Circumference      Peak Flow      Pain Score 0     Pain Loc      Pain Edu?      Excl. in GC?    No data found.   Updated Vital Signs There were no vitals taken for this visit.  Visual Acuity Right Eye Distance:   Left Eye Distance:   Bilateral Distance:    Right Eye Near:   Left Eye Near:    Bilateral Near:     Physical Exam  Constitutional: She is oriented to person, place, and time.  This woman is somewhat cachectic with poor hygiene.  HENT:  Head: Normocephalic.  Right Ear: External ear normal.  Left Ear: External ear normal.  poor dentition  Eyes: Conjunctivae and EOM are normal. Pupils are equal, round, and reactive to light.  Neck: Normal range of motion. Neck supple.  Cardiovascular: Normal rate, regular rhythm and normal heart sounds.   Pulmonary/Chest: Effort normal and  breath sounds normal.  Abdominal: Soft. Bowel sounds are normal.  Musculoskeletal: Normal range of motion.  Neurological: She is alert and oriented to person, place, and time.  Skin: Skin is warm and dry.  Nursing note and vitals reviewed.    UC Treatments / Results  Labs (all labs ordered are listed, but only abnormal results are displayed) Labs Reviewed  TSH  POCT URINALYSIS DIP (DEVICE)  URINE CYTOLOGY ANCILLARY ONLY    EKG  EKG Interpretation None       Radiology No results found.  Procedures Procedures (including critical care time)  Medications Ordered in UC Medications  azithromycin (ZITHROMAX) tablet 1,000 mg (not administered)     Initial Impression / Assessment and Plan / UC Course  I have reviewed the triage vital signs and the nursing notes.  Pertinent labs & imaging results that were available during my care of the patient were reviewed by me and considered in my medical decision making (see chart for details).     Final Clinical Impressions(s) / UC Diagnoses   Final diagnoses:  Nasal congestion  Pelvic pain    New Prescriptions Current Discharge Medication List    Patient advised to stay off drugs. We are planning on contacting her when her labs are completely ready.   Elvina Sidle, MD 03/05/17 1535

## 2017-03-06 LAB — URINE CYTOLOGY ANCILLARY ONLY
Chlamydia: NEGATIVE
Neisseria Gonorrhea: NEGATIVE
Trichomonas: NEGATIVE

## 2017-10-26 ENCOUNTER — Encounter (HOSPITAL_COMMUNITY): Payer: Self-pay | Admitting: Family Medicine

## 2017-10-26 ENCOUNTER — Ambulatory Visit (INDEPENDENT_AMBULATORY_CARE_PROVIDER_SITE_OTHER): Payer: Self-pay

## 2017-10-26 ENCOUNTER — Ambulatory Visit (HOSPITAL_COMMUNITY)
Admission: EM | Admit: 2017-10-26 | Discharge: 2017-10-26 | Disposition: A | Discharge status: Home / Self Care | Payer: Self-pay | Attending: Internal Medicine | Admitting: Internal Medicine

## 2017-10-26 DIAGNOSIS — J40 Bronchitis, not specified as acute or chronic: Secondary | ICD-10-CM

## 2017-10-26 DIAGNOSIS — R05 Cough: Secondary | ICD-10-CM

## 2017-10-26 MED ORDER — PREDNISONE 20 MG PO TABS: 40.0000 mg | ORAL_TABLET | Freq: Every day | ORAL | 0 refills | Status: AC

## 2017-10-26 MED ORDER — AZITHROMYCIN 250 MG PO TABS: 250.0000 mg | ORAL_TABLET | Freq: Every day | ORAL | 0 refills | Status: DC

## 2017-10-26 NOTE — ED Triage Notes (Signed)
Pt here for URI symptoms.  

## 2017-10-26 NOTE — Discharge Instructions (Signed)
Chest xray negative for pneumonia. Start azithromycin and prednisone as directed. Keep hydrated, your urine should be clear to pale yellow in color. Tylenol/motrin for fever and pain. Encourage smoking cessation. Monitor for any worsening of symptoms, chest pain, shortness of breath, wheezing, swelling of the throat, follow up for reevaluation.

## 2017-10-26 NOTE — ED Provider Notes (Signed)
[facility]    CSN: [id number] Arrival date & time: 10/26/17  1746     History   Chief Complaint Chief Complaint  Patient presents with  . Cough    HPI [name] [name] is a 31 y.o. female.   31 year old female with history of substance abuse, anxiety, depression comes in for 2 week history of URI symptoms. Patient states symptoms first started out as cough, body soreness, chills, sore throat, nasal congestion/rhinorrhea. Denies fever. Patient states she has been taking otc nyquil/dayquil with some improvement. Other symptoms has since resolved, but cough has continued. Mild productive cough that is worse at night. She had 1 episode of post tussive vomiting. Denies chest pain, shortness of breath. Current every day smoker.       Past Medical History:  Diagnosis Date  . Abscess of axilla   . Anemia   . Anxiety   . Blood transfusion without reported diagnosis   . Depression    bipolar; not on meds  . Headache(784.0)   . Heart murmur   . Hemorrhoids   . Substance abuse [facility])     Patient Active Problem List   Diagnosis Date Noted  . NSVD (normal spontaneous vaginal delivery) 04/29/2016  . Active labor 04/27/2016  . 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 Drug overdose 06/19/2012  . Suicide attempt (HCC) 06/19/2012  . UTI (lower urinary tract infection) 06/19/2012  . Microcytic anemia 06/17/2012  . Hypokalemia 06/17/2012  . 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  . Tobacco abuse 06/17/2012    Past Surgical History:  Procedure Laterality Date  . NO PAST SURGERIES      OB History    Gravida Para Term Preterm AB Living   4 3 2 1 1 3    SAB TAB Ectopic Multiple Live Births   1 0 0 0 3       Home Medications    Prior to Admission medications   Medication Sig Start Date End Date Taking? Authorizing Provider  azithromycin (ZITHROMAX) 250 MG tablet Take 1 tablet (250 mg total) daily by mouth. Take first 2 tablets  together, then 1 every day until finished. 10/26/17   Cathie Hoops, Amy V, PA-C  ferrous sulfate 325 (65 FE) MG tablet Take 325 mg by mouth daily with breakfast.    [provider]  ibuprofen (ADVIL,MOTRIN) 600 MG tablet Take 1 tablet (600 mg total) by mouth every 6 (six) hours. 04/29/16   Federico Flake, MD  predniSONE (DELTASONE) 20 MG tablet Take 2 tablets (40 mg total) daily for 4 days by mouth. 10/26/17 10/30/17  Belinda Fisher, PA-C    Family History Family History  Problem Relation Age of Onset  . Hypertension Mother   . Diabetes type II Mother   . Arthritis Mother   . Hyperlipidemia Mother   . Cancer Father        lung    Social History Social History   Tobacco Use  . Smoking status: Current Every Day Smoker    Packs/day: 0.50    Years: 9.00    Pack years: 4.50    Types: Cigarettes  . Smokeless tobacco: Never Used  Substance Use Topics  . Alcohol use: Yes    Comment: rare  . Drug use: Yes    Frequency: 3.0 times per week    Types: Marijuana, Cocaine     Allergies   Amoxicillin and Penicillins   Review of Systems Review of Systems  Reason unable to perform ROS: See HPI as above.     Physical Exam Triage Vital Signs ED Triage Vitals [10/26/17 1812]  Enc Vitals Group     BP 107/75     Pulse Rate 72     Resp 18     Temp 98.4 F (36.9 C)     Temp src      SpO2 100 %     Weight      Height      Head Circumference      Peak Flow      Pain Score      Pain Loc      Pain Edu?      Excl. in GC?    No data found.  Updated Vital Signs BP 107/75   Pulse 72   Temp 98.4 F (36.9 C)   Resp 18   LMP 10/16/2017 (Exact Date)   SpO2 100%   Physical Exam  Constitutional: She is oriented to person, place, and time. She appears well-developed and well-nourished. No distress.  HENT:  Head: Normocephalic and atraumatic.  Right Ear: Tympanic membrane, external ear and ear canal normal. Tympanic membrane is not erythematous and not bulging.  Left Ear:  Tympanic membrane, external ear and ear canal normal. Tympanic membrane is not erythematous and not bulging.  Nose: Nose normal. Right sinus exhibits no maxillary sinus tenderness and no frontal sinus tenderness. Left sinus exhibits no maxillary sinus tenderness and no frontal sinus tenderness.  Mouth/Throat: Uvula is midline, oropharynx is clear and moist and mucous membranes are normal.  Eyes: Conjunctivae are normal. Pupils are equal, round, and reactive to light.  Neck: Normal range of motion. Neck supple.  Cardiovascular: Normal rate, regular rhythm and normal heart sounds. Exam reveals no gallop and no friction rub.  No murmur heard. Pulmonary/Chest: Effort normal.  Difficult exam due to continued coughing throughout exam. No obvious wheezing, crackles heard.   Lymphadenopathy:    She has no cervical adenopathy.  Neurological: She is alert and oriented to person, place, and time.  Skin: Skin is warm and dry.  Psychiatric: She has a normal mood and affect. Her behavior is normal. Judgment normal.     UC Treatments / Results  Labs (all labs ordered are listed, but only abnormal results are displayed) Labs Reviewed - No data to display  EKG  EKG Interpretation None       Radiology Dg Chest 2 View  Result Date: 10/26/2017 CLINICAL DATA:  Per pt: sick for two weeks, with cough, no fever, slight head ache. Smoker of 5 to 1/2 ppd of cigarettes. No history of cardiac or respiratory disease. No HBP. Patient is not a diabetic EXAM: CHEST  2 VIEW COMPARISON:  None. FINDINGS: Normal mediastinum and cardiac silhouette. Normal pulmonary vasculature. No evidence of effusion, infiltrate, or pneumothorax. No acute bony abnormality. IMPRESSION: No acute cardiopulmonary process. Electronically Signed   By: Genevive BiStewart  Edmunds M.D.   On: 10/26/2017 19:27    Procedures Procedures (including critical care time)  Medications Ordered in UC Medications - No data to display   Initial Impression /  Assessment and Plan / UC Course  I have reviewed the triage vital signs and the nursing notes.  Pertinent labs & imaging results that were available during my care of the patient were reviewed by me and considered in my medical decision making (see chart for details).    CXR negative for pneumonia. Start azithromycin and prednisone as directed. Smoking cessation encouraged. Return  precautions given.   Final Clinical Impressions(s) / UC Diagnoses   Final diagnoses:  Bronchitis    ED Discharge Orders        Ordered    azithromycin (ZITHROMAX) 250 MG tablet  Daily     10/26/17 1938    predniSONE (DELTASONE) 20 MG tablet  Daily     10/26/17 1938        Belinda FisherYu, Amy V, PA-C 10/26/17 2010

## 2017-12-18 DIAGNOSIS — K610 Anal abscess: Secondary | ICD-10-CM

## 2017-12-18 HISTORY — DX: Anal abscess: K61.0

## 2017-12-18 NOTE — L&D Delivery Note (Addendum)
Patient: [name] [name] MRN: [id number]  GBS status: Positive in prior pregnancy, IAP given  Patient is a 32 y.o. now G5P4 s/p NSVD at 5566w6d, who was admitted for IOL due to IUGR. AROM 9h 572m prior to delivery with clear fluid.  Head delivered LOA. No nuchal cord present. Shoulder and body delivered in usual fashion. Infant with spontaneous cry, placed on mother's abdomen, dried and bulb suctioned. Cord clamped x 2 after 1-minute delay, and cut by family member. Baby had reduced tone with retractions at this time and pediatrics called to evaluate. Infant received Narcan and neopuff with recovery to normal respirations. Returned to mother.   Cord blood drawn. Placenta delivered spontaneously with gentle cord traction. Fundus firm with massage and Pitocin. Perineum inspected and found to have 1st degree periurethral laceration, which was found to be hemostatic.  Delivery Note At 4:00 AM a viable female was delivered via Vaginal, Spontaneous (Presentation: cephalic; LOA).  APGAR: 8, 5; weight 2345g.   Placenta status: intact, 3 vessel.  Sent to path.  Anesthesia: Fentanyl, 50 mcg approximately 1 hour prior to delivery  Episiotomy: None Lacerations:  1st degree periurethral Est. Blood Loss (mL):  365  Mom to postpartum.  Baby to Couplet care / Skin to Skin.  Shawn RouteSarah A Peiffer 11/29/2018, 4:20 AM   OB FELLOW DELIVERY ATTESTATION  I was gloved and present for the delivery in its entirety, and I agree with the above resident's note.    Marcy Sirenatherine Nikolette Reindl, D.O. OB Fellow  11/29/2018, 8:06 AM

## 2018-01-16 ENCOUNTER — Observation Stay (HOSPITAL_COMMUNITY)
Admission: EM | Admit: 2018-01-16 | Discharge: 2018-01-17 | Disposition: A | Discharge status: Home / Self Care | Payer: Medicaid Other | Attending: General Surgery | Admitting: General Surgery

## 2018-01-16 ENCOUNTER — Emergency Department (HOSPITAL_COMMUNITY): Payer: Medicaid Other | Admitting: Anesthesiology

## 2018-01-16 ENCOUNTER — Emergency Department (HOSPITAL_COMMUNITY): Payer: Medicaid Other

## 2018-01-16 ENCOUNTER — Encounter (HOSPITAL_COMMUNITY): Payer: Self-pay

## 2018-01-16 ENCOUNTER — Encounter (HOSPITAL_COMMUNITY)
Admission: EM | Disposition: A | Discharge status: Home / Self Care | Payer: Self-pay | Source: Home / Self Care | Attending: Emergency Medicine

## 2018-01-16 ENCOUNTER — Other Ambulatory Visit: Payer: Self-pay

## 2018-01-16 DIAGNOSIS — F17210 Nicotine dependence, cigarettes, uncomplicated: Secondary | ICD-10-CM | POA: Diagnosis not present

## 2018-01-16 DIAGNOSIS — F419 Anxiety disorder, unspecified: Secondary | ICD-10-CM | POA: Insufficient documentation

## 2018-01-16 DIAGNOSIS — K611 Rectal abscess: Principal | ICD-10-CM | POA: Insufficient documentation

## 2018-01-16 DIAGNOSIS — D509 Iron deficiency anemia, unspecified: Secondary | ICD-10-CM | POA: Diagnosis not present

## 2018-01-16 DIAGNOSIS — K610 Anal abscess: Secondary | ICD-10-CM

## 2018-01-16 DIAGNOSIS — Z880 Allergy status to penicillin: Secondary | ICD-10-CM | POA: Insufficient documentation

## 2018-01-16 DIAGNOSIS — F329 Major depressive disorder, single episode, unspecified: Secondary | ICD-10-CM | POA: Diagnosis not present

## 2018-01-16 DIAGNOSIS — K5900 Constipation, unspecified: Secondary | ICD-10-CM | POA: Diagnosis not present

## 2018-01-16 HISTORY — PX: INCISION AND DRAINAGE PERIRECTAL ABSCESS: SHX1804

## 2018-01-16 HISTORY — PX: INCISE AND DRAIN ABCESS: PRO64

## 2018-01-16 HISTORY — DX: Anal abscess: K61.0

## 2018-01-16 LAB — CBC
HEMATOCRIT: 26 % — AB (ref 36.0–46.0)
HEMOGLOBIN: 7.2 g/dL — AB (ref 12.0–15.0)
MCH: 15.8 pg — ABNORMAL LOW (ref 26.0–34.0)
MCHC: 27.7 g/dL — AB (ref 30.0–36.0)
MCV: 57.1 fL — ABNORMAL LOW (ref 78.0–100.0)
Platelets: 577 10*3/uL — ABNORMAL HIGH (ref 150–400)
RBC: 4.55 MIL/uL (ref 3.87–5.11)
RDW: 22 % — ABNORMAL HIGH (ref 11.5–15.5)
WBC: 5.2 10*3/uL (ref 4.0–10.5)

## 2018-01-16 LAB — BASIC METABOLIC PANEL
ANION GAP: 7 (ref 5–15)
BUN: 9 mg/dL (ref 6–20)
CHLORIDE: 111 mmol/L (ref 101–111)
CO2: 22 mmol/L (ref 22–32)
CREATININE: 0.6 mg/dL (ref 0.44–1.00)
Calcium: 8.9 mg/dL (ref 8.9–10.3)
GFR calc Af Amer: >60 mL/min (ref >60)
GFR calc non Af Amer: >60 mL/min (ref >60)
Glucose, Bld: 86 mg/dL (ref 65–99)
POTASSIUM: 3.9 mmol/L (ref 3.5–5.1)
Sodium: 140 mmol/L (ref 135–145)

## 2018-01-16 LAB — I-STAT BETA HCG BLOOD, ED (MC, WL, AP ONLY): I-stat hCG, quantitative: <5 m[IU]/mL (ref <5)

## 2018-01-16 SURGERY — INCISION AND DRAINAGE, ABSCESS, PERIRECTAL
Anesthesia: General | Site: Rectum

## 2018-01-16 MED ORDER — FENTANYL CITRATE (PF) 100 MCG/2ML IJ SOLN
INTRAMUSCULAR | Status: DC | PRN
  Administered 2018-01-16: 100 ug via INTRAVENOUS
  Administered 2018-01-16 (×2): 50 ug via INTRAVENOUS

## 2018-01-16 MED ORDER — FENTANYL CITRATE (PF) 100 MCG/2ML IJ SOLN
INTRAMUSCULAR | Status: AC
  Administered 2018-01-16: 50 ug via INTRAVENOUS
  Filled 2018-01-16: qty 2

## 2018-01-16 MED ORDER — LACTATED RINGERS IV SOLN
INTRAVENOUS | Status: DC | PRN
  Administered 2018-01-16: 15:00:00 via INTRAVENOUS

## 2018-01-16 MED ORDER — ONDANSETRON HCL 4 MG/2ML IJ SOLN
INTRAMUSCULAR | Status: DC | PRN
  Administered 2018-01-16: 4 mg via INTRAVENOUS

## 2018-01-16 MED ORDER — MORPHINE SULFATE (PF) 4 MG/ML IV SOLN
2.0000 mg | INTRAVENOUS | Status: DC | PRN
  Administered 2018-01-17: 2 mg via INTRAVENOUS
  Filled 2018-01-16: qty 1

## 2018-01-16 MED ORDER — IOPAMIDOL (ISOVUE-300) INJECTION 61%
INTRAVENOUS | Status: AC
  Administered 2018-01-16: 75 mL via INTRAVENOUS
  Filled 2018-01-16: qty 100

## 2018-01-16 MED ORDER — DIPHENHYDRAMINE HCL 25 MG PO CAPS: 25.0000 mg | ORAL_CAPSULE | Freq: Four times a day (QID) | ORAL | Status: DC | PRN

## 2018-01-16 MED ORDER — ACETAMINOPHEN 325 MG PO TABS: 325.0000 mg | ORAL_TABLET | ORAL | Status: DC | PRN

## 2018-01-16 MED ORDER — OXYCODONE HCL 5 MG PO TABS
5.0000 mg | ORAL_TABLET | ORAL | Status: DC | PRN
  Administered 2018-01-16: 5 mg via ORAL
  Administered 2018-01-17: 10 mg via ORAL
  Filled 2018-01-16 (×2): qty 2

## 2018-01-16 MED ORDER — PROPOFOL 10 MG/ML IV BOLUS
INTRAVENOUS | Status: AC
  Filled 2018-01-16: qty 20

## 2018-01-16 MED ORDER — ONDANSETRON HCL 4 MG/2ML IJ SOLN
4.0000 mg | Freq: Four times a day (QID) | INTRAMUSCULAR | Status: DC | PRN
  Administered 2018-01-17: 4 mg via INTRAVENOUS
  Filled 2018-01-16: qty 2

## 2018-01-16 MED ORDER — FENTANYL CITRATE (PF) 100 MCG/2ML IJ SOLN
25.0000 ug | INTRAMUSCULAR | Status: DC | PRN
  Administered 2018-01-16 (×2): 50 ug via INTRAVENOUS

## 2018-01-16 MED ORDER — LIDOCAINE 2% (20 MG/ML) 5 ML SYRINGE
INTRAMUSCULAR | Status: AC
  Filled 2018-01-16: qty 10

## 2018-01-16 MED ORDER — PHENYLEPHRINE 40 MCG/ML (10ML) SYRINGE FOR IV PUSH (FOR BLOOD PRESSURE SUPPORT)
PREFILLED_SYRINGE | INTRAVENOUS | Status: AC
  Filled 2018-01-16: qty 10

## 2018-01-16 MED ORDER — ACETAMINOPHEN 160 MG/5ML PO SOLN: 325.0000 mg | ORAL | Status: DC | PRN

## 2018-01-16 MED ORDER — MIDAZOLAM HCL 2 MG/2ML IJ SOLN
INTRAMUSCULAR | Status: AC
Start: 2018-01-16 — End: ?
  Filled 2018-01-16: qty 2

## 2018-01-16 MED ORDER — SUCCINYLCHOLINE CHLORIDE 200 MG/10ML IV SOSY
PREFILLED_SYRINGE | INTRAVENOUS | Status: AC
  Filled 2018-01-16: qty 10

## 2018-01-16 MED ORDER — 0.9 % SODIUM CHLORIDE (POUR BTL) OPTIME
TOPICAL | Status: DC | PRN
  Administered 2018-01-16: 1000 mL

## 2018-01-16 MED ORDER — FERROUS SULFATE 325 (65 FE) MG PO TABS
325.0000 mg | ORAL_TABLET | Freq: Every day | ORAL | Status: DC
Start: 2018-01-17 — End: 2018-01-17
  Administered 2018-01-17: 325 mg via ORAL
  Filled 2018-01-16: qty 1

## 2018-01-16 MED ORDER — LIDOCAINE HCL (CARDIAC) 20 MG/ML IV SOLN
INTRAVENOUS | Status: DC | PRN
  Administered 2018-01-16: 60 mg via INTRAVENOUS

## 2018-01-16 MED ORDER — OXYCODONE HCL 5 MG PO TABS
ORAL_TABLET | ORAL | Status: AC
  Filled 2018-01-16: qty 1

## 2018-01-16 MED ORDER — OXYCODONE HCL 5 MG PO TABS
5.0000 mg | ORAL_TABLET | Freq: Once | ORAL | Status: AC | PRN
  Administered 2018-01-16: 5 mg via ORAL

## 2018-01-16 MED ORDER — SODIUM CHLORIDE 0.9 % IV BOLUS (SEPSIS)
1000.0000 mL | Freq: Once | INTRAVENOUS | Status: AC
  Administered 2018-01-16: 1000 mL via INTRAVENOUS

## 2018-01-16 MED ORDER — OXYCODONE HCL 5 MG/5ML PO SOLN: 5.0000 mg | Freq: Once | ORAL | Status: AC | PRN

## 2018-01-16 MED ORDER — SODIUM CHLORIDE 0.9 % IV SOLN
3.0000 g | Freq: Four times a day (QID) | INTRAVENOUS | Status: DC
  Administered 2018-01-16 – 2018-01-17 (×4): 3 g via INTRAVENOUS
  Filled 2018-01-16 (×7): qty 3

## 2018-01-16 MED ORDER — EPHEDRINE 5 MG/ML INJ
INTRAVENOUS | Status: AC
  Filled 2018-01-16: qty 10

## 2018-01-16 MED ORDER — ONDANSETRON HCL 4 MG/2ML IJ SOLN
INTRAMUSCULAR | Status: AC
  Filled 2018-01-16: qty 2

## 2018-01-16 MED ORDER — MIDAZOLAM HCL 5 MG/5ML IJ SOLN
INTRAMUSCULAR | Status: DC | PRN
  Administered 2018-01-16: 2 mg via INTRAVENOUS

## 2018-01-16 MED ORDER — ONDANSETRON 4 MG PO TBDP: 4.0000 mg | ORAL_TABLET | Freq: Four times a day (QID) | ORAL | Status: DC | PRN

## 2018-01-16 MED ORDER — DIPHENHYDRAMINE HCL 50 MG/ML IJ SOLN: 25.0000 mg | Freq: Four times a day (QID) | INTRAMUSCULAR | Status: DC | PRN

## 2018-01-16 MED ORDER — SUCCINYLCHOLINE CHLORIDE 20 MG/ML IJ SOLN
INTRAMUSCULAR | Status: DC | PRN
  Administered 2018-01-16: 60 mg via INTRAVENOUS

## 2018-01-16 MED ORDER — PROPOFOL 10 MG/ML IV BOLUS
INTRAVENOUS | Status: DC | PRN
  Administered 2018-01-16: 20 mg via INTRAVENOUS
  Administered 2018-01-16: 120 mg via INTRAVENOUS

## 2018-01-16 MED ORDER — FENTANYL CITRATE (PF) 250 MCG/5ML IJ SOLN
INTRAMUSCULAR | Status: AC
  Filled 2018-01-16: qty 5

## 2018-01-16 SURGICAL SUPPLY — 32 items
BNDG GAUZE ELAST 4 BULKY (GAUZE/BANDAGES/DRESSINGS) IMPLANT
COVER MAYO STAND STRL (DRAPES) ×2 IMPLANT
COVER SURGICAL LIGHT HANDLE (MISCELLANEOUS) ×2 IMPLANT
DRAIN PENROSE 1/4X12 LTX STRL (WOUND CARE) ×1 IMPLANT
ELECT CAUTERY BLADE 6.4 (BLADE) ×2 IMPLANT
ELECT REM PT RETURN 9FT ADLT (ELECTROSURGICAL) ×2
ELECTRODE REM PT RTRN 9FT ADLT (ELECTROSURGICAL) ×1 IMPLANT
GAUZE SPONGE 4X4 12PLY STRL (GAUZE/BANDAGES/DRESSINGS) ×1 IMPLANT
GLOVE BIOGEL PI IND STRL 6.5 (GLOVE) IMPLANT
GLOVE BIOGEL PI IND STRL 7.5 (GLOVE) ×1 IMPLANT
GLOVE BIOGEL PI INDICATOR 6.5 (GLOVE) ×1
GLOVE BIOGEL PI INDICATOR 7.5 (GLOVE) ×1
GLOVE SURG SS PI 7.0 STRL IVOR (GLOVE) ×2 IMPLANT
GOWN STRL REUS W/ TWL LRG LVL3 (GOWN DISPOSABLE) ×2 IMPLANT
GOWN STRL REUS W/TWL LRG LVL3 (GOWN DISPOSABLE) ×4
KIT BASIN OR (CUSTOM PROCEDURE TRAY) ×2 IMPLANT
KIT ROOM TURNOVER OR (KITS) ×2 IMPLANT
NEEDLE 22X1 1/2 (OR ONLY) (NEEDLE) ×1 IMPLANT
NS IRRIG 1000ML POUR BTL (IV SOLUTION) ×2 IMPLANT
PACK LITHOTOMY IV (CUSTOM PROCEDURE TRAY) ×2 IMPLANT
PAD ARMBOARD 7.5X6 YLW CONV (MISCELLANEOUS) ×2 IMPLANT
PENCIL BUTTON HOLSTER BLD 10FT (ELECTRODE) ×2 IMPLANT
SPONGE LAP 18X18 X RAY DECT (DISPOSABLE) ×2 IMPLANT
SURGILUBE 2OZ TUBE FLIPTOP (MISCELLANEOUS) ×2 IMPLANT
SUT SILK 2 0 TIES 10X30 (SUTURE) ×1 IMPLANT
SWAB CULTURE LIQ STUART DBL (MISCELLANEOUS) ×1 IMPLANT
SWAB CULTURE LIQUID MINI MALE (MISCELLANEOUS) ×1 IMPLANT
SYR BULB 3OZ (MISCELLANEOUS) ×2 IMPLANT
TOWEL OR 17X24 6PK STRL BLUE (TOWEL DISPOSABLE) ×1 IMPLANT
TOWEL OR 17X26 10 PK STRL BLUE (TOWEL DISPOSABLE) ×2 IMPLANT
TUBE CONNECTING 12X1/4 (SUCTIONS) ×2 IMPLANT
YANKAUER SUCT BULB TIP NO VENT (SUCTIONS) ×2 IMPLANT

## 2018-01-16 NOTE — ED Triage Notes (Signed)
Patient complains of 8 days of vaginal bleeding. Also complains of pain to perineum. States that she has knot and concerned this is causing her bleeding. Patient pale and increased fatigue. Alert and oriented

## 2018-01-16 NOTE — Anesthesia Procedure Notes (Signed)
Procedure Name: Intubation Date/Time: 01/16/2018 3:38 PM Performed by: Eligha Bridegroom, CRNA Pre-anesthesia Checklist: Patient identified, Emergency Drugs available, Suction available and Patient being monitored Patient Re-evaluated:Patient Re-evaluated prior to induction Oxygen Delivery Method: Circle system utilized Preoxygenation: Pre-oxygenation with 100% oxygen Induction Type: IV induction Ventilation: Mask ventilation without difficulty Laryngoscope Size: Mac and 3 Grade View: Grade I Tube type: Oral Tube size: 7.0 mm Number of attempts: 1 Airway Equipment and Method: Stylet Placement Confirmation: ETT inserted through vocal cords under direct vision,  positive ETCO2 and breath sounds checked- equal and bilateral Secured at: 21 cm Tube secured with: Tape Dental Injury: Teeth and Oropharynx as per pre-operative assessment

## 2018-01-16 NOTE — Anesthesia Preprocedure Evaluation (Signed)
Anesthesia Evaluation  Patient identified by MRN, date of birth, ID band Patient awake    Reviewed: Allergy & Precautions, NPO status , Patient's Chart, lab work & pertinent test results  History of Anesthesia Complications Negative for: history of anesthetic complications  Airway Mallampati: I  TM Distance: >3 FB Neck ROM: Full    Dental  (+) Missing,    Pulmonary Current Smoker,    breath sounds clear to auscultation       Cardiovascular negative cardio ROS   Rhythm:Regular     Neuro/Psych  Headaches, PSYCHIATRIC DISORDERS Anxiety Depression    GI/Hepatic negative GI ROS, Neg liver ROS,   Endo/Other  negative endocrine ROS  Renal/GU negative Renal ROS     Musculoskeletal   Abdominal   Peds  Hematology  (+) anemia ,   Anesthesia Other Findings   Reproductive/Obstetrics                             Anesthesia Physical Anesthesia Plan  ASA: II  Anesthesia Plan: General   Post-op Pain Management:    Induction: Intravenous  PONV Risk Score and Plan: 2  Airway Management Planned: Oral ETT  Additional Equipment: None  Intra-op Plan:   Post-operative Plan: Extubation in OR  Informed Consent: I have reviewed the patients History and Physical, chart, labs and discussed the procedure including the risks, benefits and alternatives for the proposed anesthesia with the patient or authorized representative who has indicated his/her understanding and acceptance.   Dental advisory given  Plan Discussed with: CRNA and Surgeon  Anesthesia Plan Comments:         Anesthesia Quick Evaluation

## 2018-01-16 NOTE — ED Notes (Signed)
Pt states she has had this hemorrhoid that started 14 years ago then developed into a skin tag and now the area is inbetween rectal and vaginal area and has grown. She cannot tell if it is draining.  She started her menstrual 8 days ago.  Pt states pain 8/10.  Family at bedside.  Pelvic cart to bedside

## 2018-01-16 NOTE — Transfer of Care (Signed)
Immediate Anesthesia Transfer of Care Note  Patient: [name] [name]  Procedure(s) Performed: IRRIGATION AND DEBRIDEMENT PERIRECTAL ABSCESS (N/A )  Patient Location: PACU  Anesthesia Type:General  Level of Consciousness: awake, alert  and oriented  Airway & Oxygen Therapy: Patient Spontanous Breathing  Post-op Assessment: Report given to RN and Post -op Vital signs reviewed and stable  Post vital signs: Reviewed and stable  Last Vitals:  Vitals:   01/16/18 1320 01/16/18 1400  BP: 107/72 119/62  Pulse: (!) 59 (!) 57  Resp: 16   Temp:    SpO2: 100% 100%    Last Pain:  Vitals:   01/16/18 0920  TempSrc:   PainSc: 8          Complications: No apparent anesthesia complications

## 2018-01-16 NOTE — Op Note (Signed)
Preoperative diagnosis: perirectal abscess  Postoperative diagnosis: same   Procedure: incision and drainage of perirectal abscess  Surgeon: Feliciana RossettiLuke Kinsinger, M.D.  Asst: none  Anesthesia: general  Indications for procedure: [name] [name] is a 32 y.o. year old female with symptoms of perianal pain. She presented to the ER and was found to have a small anterior left perirectal abscess.  Description of procedure: The patient was brought into the operative suite. Anesthesia was administered with General LMA anesthesia. WHO checklist was applied. The patient was then placed in lithotomy position. The area was prepped and draped in the usual sterile fashion.  Next, the area of highest fluctuance was identified and a stab incision was made. A moderate amount of purulence was expressed. Cultures were sent. A hemostat was used to widen the tract and a counterincision was made. A 1/4" penrose was placed through the incisions and tied in place with 3 2-0 silks. The wound irrigated. Bandage was put in place  Findings: small left anterior abscess  Specimen: perirectal abscess  Implant: 1/4" penrose drain   Blood loss: <7430ml  Local anesthesia: none  Complications: none  Feliciana RossettiLuke Kinsinger, M.D. General, Bariatric, & Minimally Invasive Surgery [facility]

## 2018-01-16 NOTE — ED Provider Notes (Signed)
[facility] Provider Note   CSN: [id number] Arrival date & time: 01/16/18  0844     History   Chief Complaint Chief Complaint  Patient presents with  . Vaginal Bleeding    HPI [name] [name] is a 32 y.o. female.  Patient is a 32 year old female who presents with rectal pain.  She has a history of anemia and has previously been on iron but has not been taking it because it makes her constipated.  She also has a history of a hemorrhoid/skin tag to her rectum.  She states that she has had this since the birth of her first child several years ago.  She states she has seen several surgeons who have told her it is a Educational psychologistcosmetic repair that is not covered by Medicaid so she has not had it removed.  She states this makes it difficult for her to have bowel movements because it bulges out after she has a bowel movement and then she has to push it back in.  She states over the last 7-8 days she has noticed a painful knot in her perineum adjacent to the skin tag.  She has not had this before.  She states now it is very painful to have a bowel movement.  She is noticed a little bit of blood when she wipes.  She recently had her menstrual cycle and does not know if she is having a little spotting related to the end of the cycle or if it is more bleeding at the site of the knot.  She denies any vaginal discharge.  No abdominal pain.  No nausea or vomiting.  No fevers.  She does report a cough that is been going on for several months.  She was told previously in urgent care in October that it was bronchitis but is still persisting.  She does complain of some fatigue related to her anemia but no shortness of breath.  No dizziness.      Past Medical History:  Diagnosis Date  . Abscess of axilla   . Anemia   . Anxiety   . Blood transfusion without reported diagnosis   . Depression    bipolar; not on meds  . Headache(784.0)   . Heart murmur   . Hemorrhoids   .  Substance abuse [facility](HCC)     Patient Active Problem List   Diagnosis Date Noted  . NSVD (normal spontaneous vaginal delivery) 04/29/2016  . Active labor 04/27/2016  . 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 Drug overdose 06/19/2012  . Suicide attempt (HCC) 06/19/2012  . UTI (lower urinary tract infection) 06/19/2012  . Microcytic anemia 06/17/2012  . Hypokalemia 06/17/2012  . 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  . Tobacco abuse 06/17/2012    Past Surgical History:  Procedure Laterality Date  . NO PAST SURGERIES      OB History    Gravida Para Term Preterm AB Living   4 3 2 1 1 3    SAB TAB Ectopic Multiple Live Births   1 0 0 0 3       Home Medications    Prior to Admission medications   Medication Sig Start Date End Date Taking? Authorizing Provider  azithromycin (ZITHROMAX) 250 MG tablet Take 1 tablet (250 mg total) daily by mouth. Take first 2 tablets together, then 1 every day until finished. Patient not taking: Reported on 01/16/2018 10/26/17   Belinda FisherYu, Amy V, PA-C  ferrous sulfate 325 (  65 FE) MG tablet Take 325 mg by mouth daily with breakfast.    [provider]  ibuprofen (ADVIL,MOTRIN) 600 MG tablet Take 1 tablet (600 mg total) by mouth every 6 (six) hours. Patient not taking: Reported on 01/16/2018 04/29/16   Federico Flake, MD    Family History Family History  Problem Relation Age of Onset  . Hypertension Mother   . Diabetes type II Mother   . Arthritis Mother   . Hyperlipidemia Mother   . Cancer Father        lung    Social History Social History   Tobacco Use  . Smoking status: Current Every Day Smoker    Packs/day: 0.50    Years: 9.00    Pack years: 4.50    Types: Cigarettes  . Smokeless tobacco: Never Used  Substance Use Topics  . Alcohol use: Yes    Comment: rare  . Drug use: Yes    Frequency: 3.0 times per week    Types: Marijuana, Cocaine     Allergies   Amoxicillin and  Penicillins   Review of Systems Review of Systems  Constitutional: Positive for fatigue. Negative for chills, diaphoresis and fever.  HENT: Negative for congestion, rhinorrhea and sneezing.   Eyes: Negative.   Respiratory: Positive for cough. Negative for chest tightness and shortness of breath.   Cardiovascular: Negative for chest pain and leg swelling.  Gastrointestinal: Negative for abdominal pain, blood in stool, diarrhea, nausea and vomiting.       Rectal pain  Genitourinary: Negative for difficulty urinating, flank pain, frequency and hematuria.  Musculoskeletal: Negative for arthralgias and back pain.  Skin: Negative for rash.  Neurological: Negative for dizziness, speech difficulty, weakness, numbness and headaches.     Physical Exam Updated Vital Signs BP 119/62   Pulse (!) 57   Temp (!) 97.5 F (36.4 C) (Oral)   Resp 16   LMP 01/11/2018 (Exact Date)   SpO2 100%   Physical Exam  Constitutional: She is oriented to person, place, and time. She appears well-developed and well-nourished.  HENT:  Head: Normocephalic and atraumatic.  Eyes: Pupils are equal, round, and reactive to light.  Neck: Normal range of motion. Neck supple.  Cardiovascular: Normal rate, regular rhythm and normal heart sounds.  Pulmonary/Chest: Effort normal and breath sounds normal. No respiratory distress. She has no wheezes. She has no rales. She exhibits no tenderness.  Abdominal: Soft. Bowel sounds are normal. There is no tenderness. There is no rebound and no guarding.  Genitourinary:  Genitourinary Comments: Patient has a 1 cm nonthrombosed hemorrhoid.  It is soft and nontender.  She does have some fullness and tenderness to her perineal/perirectal area.  No noticeable vaginal bleeding or discharge.  Musculoskeletal: Normal range of motion. She exhibits no edema.  Lymphadenopathy:    She has no cervical adenopathy.  Neurological: She is alert and oriented to person, place, and time.  Skin:  Skin is warm and dry. No rash noted.  Psychiatric: She has a normal mood and affect.     ED Treatments / Results  Labs (all labs ordered are listed, but only abnormal results are displayed) Labs Reviewed  CBC - Abnormal; Notable for the following components:      Result Value   Hemoglobin 7.2 (*)    HCT 26.0 (*)    MCV 57.1 (*)    MCH 15.8 (*)    MCHC 27.7 (*)    RDW 22.0 (*)    Platelets 577 (*)  All other components within normal limits  BASIC METABOLIC PANEL  I-STAT BETA HCG BLOOD, ED ([facility], [facility], [facility] ONLY)    EKG  EKG Interpretation None       Radiology Dg Chest 2 View  Result Date: 01/16/2018 CLINICAL DATA:  Productive cough. EXAM: CHEST  2 VIEW COMPARISON:  Radiographs of October 26, 2017. FINDINGS: The heart size and mediastinal contours are within normal limits. Both lungs are clear. No pneumothorax or pleural effusion is noted. The visualized skeletal structures are unremarkable. IMPRESSION: No active cardiopulmonary disease. Electronically Signed   By: Lupita Raider, M.D.   On: 01/16/2018 11:31   Ct Pelvis W Contrast  Result Date: 01/16/2018 CLINICAL DATA:  Pt has a known "knot" between anus and vagina, on floor. This has been there approximately 1 year, but is growing, and pain is much worse. EXAM: CT PELVIS WITH CONTRAST TECHNIQUE: Multidetector CT imaging of the pelvis was performed using the standard protocol following the bolus administration of intravenous contrast. CONTRAST:  <See Chart> ISOVUE-300 IOPAMIDOL (ISOVUE-300) INJECTION 61% COMPARISON:  12/12/2005 FINDINGS: Urinary Tract:  Bladder decompressed. Bowel:  No bowel dilatation within the visualized pelvis. Vascular/Lymphatic: No evidence for atherosclerosis. No pelvic sidewall lymphadenopathy. Reproductive:  Uterus unremarkable.  No adnexal mass. Other:  Trace free fluid evident in the cul-de-sac. Musculoskeletal: 1.7 x 2.3 cm thick walled, irregular lesion is identified in the left ischial anal fat,  immediately adjacent to the anus. Central low attenuation suggests a fluid component or necrosis. No definite fistulous tract by CT. No soft tissue gas Bone windows reveal no worrisome lytic or sclerotic osseous lesions. IMPRESSION: 1.7 x 2.3 cm thick walled left perianal lesion likely related to infected perianal gland. No classic CT features of perianal fistula at this time. Electronically Signed   By: Kennith Center M.D.   On: 01/16/2018 12:37    Procedures Procedures (including critical care time)  Medications Ordered in ED Medications  Ampicillin-Sulbactam (UNASYN) 3 g in sodium chloride 0.9 % 100 mL IVPB (not administered)  iopamidol (ISOVUE-300) 61 % injection (75 mLs Intravenous Contrast Given 01/16/18 1210)  sodium chloride 0.9 % bolus 1,000 mL (0 mLs Intravenous Stopped 01/16/18 1413)     Initial Impression / Assessment and Plan / ED Course  I have reviewed the triage vital signs and the nursing notes.  Pertinent labs & imaging results that were available during my care of the patient were reviewed by me and considered in my medical decision making (see chart for details).     Patient has a perianal abscess on CT.  I spoke with general surgery who will admit the patient for surgical drainage.  She does have an anemia with a hemoglobin of 7.2.  She states this is baseline for her and this is also reflected in on the lab work per chart review.  She is supposed to be on iron but has not been taking it.  I did discuss with her getting back on iron supplementation.  Final Clinical Impressions(s) / ED Diagnoses   Final diagnoses:  Perianal abscess    ED Discharge Orders    None       Rolan Bucco, MD 01/16/18 1427

## 2018-01-16 NOTE — ED Notes (Signed)
Pt remains in scanner 

## 2018-01-16 NOTE — ED Notes (Signed)
Pt remains in CT

## 2018-01-16 NOTE — ED Notes (Addendum)
OR called to take pt upstairs to [facility]. Transported at this time. Belongings bagged and sent with her.

## 2018-01-16 NOTE — H&P (Signed)
[facility] Consult/Admission Note  [name] [name] Mar 28, 1986  [id number].    Requesting MD: Dr. Jacqulyn Bath Chief Complaint/Reason for Consult: perianal abscess, rectal pain  HPI:   Pt is an otherwise healthy 32 year old female with a history of iron deficiency anemia who was on iron but stopped due to constipation, who presented to the ER with complaints of rectal pain. Pt states she noticed pain rough 3 days ago. Pain has progressively worsened to severe, constant, non radiating, worse with sitting, walking and BM's. She had a normal BM yesterday. She has hemorrhoids that she states are very painful and after a BM she pushes them back into her rectum. She denies fever, chill, CP, SOB. She states she has been anemic since birth. She states she does not know why but has had several tests in the past to include sickle cell which she states was negative. Mom at bedside. Labs revealed Hg of 7.2. Pt is afebrile. CT scan showed 1.7 x 2.3 cm thick walled left perianal lesion likely related to infected perianal gland. No classic CT features of perianal fistula at this time. Pt has not had anything to eat or drink today. She is not on blood thinners.   ROS:  Review of Systems  Constitutional: Negative for chills, diaphoresis and fever.  HENT: Negative for sore throat.   Respiratory: Negative for cough and shortness of breath.   Cardiovascular: Negative for chest pain.  Gastrointestinal: Negative for abdominal pain, blood in stool, constipation, diarrhea, nausea and vomiting.  Genitourinary: Negative for dysuria.       + for rectal pain  Skin: Negative for rash.  Neurological: Negative for dizziness and loss of consciousness.  All other systems reviewed and are negative.    Family History  Problem Relation Age of Onset  . Hypertension Mother   . Diabetes type II Mother   . Arthritis Mother   . Hyperlipidemia Mother   . Cancer Father        lung    Past Medical History:  Diagnosis  Date  . Abscess of axilla   . Anemia   . Anxiety   . Blood transfusion without reported diagnosis   . Depression    bipolar; not on meds  . Headache(784.0)   . Heart murmur   . Hemorrhoids   . Substance abuse [facility])     Past Surgical History:  Procedure Laterality Date  . NO PAST SURGERIES      Social History:  reports that she has been smoking cigarettes.  She has a 4.50 pack-year smoking history. she has never used smokeless tobacco. She reports that she drinks alcohol. She reports that she uses drugs. Drugs: Marijuana and Cocaine. Frequency: 3.00 times per week.  Allergies:  Allergies  Allergen Reactions  . Amoxicillin Nausea And Vomiting    Has patient had a PCN reaction causing immediate rash, facial/tongue/throat swelling, SOB or lightheadedness with hypotension: No Has patient had a PCN reaction causing severe rash involving mucus membranes or skin necrosis: No Has patient had a PCN reaction that required hospitalization Yes Has patient had a PCN reaction occurring within the last 10 years: Yes If all of the above answers are "NO", then may proceed with Cephalosporin use.  Marland Kitchen Penicillins Nausea And Vomiting    Has patient had a PCN reaction causing immediate rash, facial/tongue/throat swelling, SOB or lightheadedness with hypotension: No Has patient had a PCN reaction causing severe rash involving mucus membranes or skin necrosis: No Has patient had  a PCN reaction that required hospitalization Yes Has patient had a PCN reaction occurring within the last 10 years: No If all of the above answers are "NO", then may proceed with Cephalosporin use.      (Not in a hospital admission)  Blood pressure 107/72, pulse (!) 59, temperature (!) 97.5 F (36.4 C), temperature source Oral, resp. rate 16, last menstrual period 01/11/2018, SpO2 100 %, unknown if currently breastfeeding.  Physical Exam  Constitutional: She is oriented to person, place, and time and well-developed,  well-nourished, and in no distress. Vital signs are normal. No distress.  HENT:  Head: Normocephalic and atraumatic.  Nose: Nose normal.  Eyes: Conjunctivae are normal. Pupils are equal, round, and reactive to light. Right eye exhibits no discharge. Left eye exhibits no discharge. No scleral icterus.  Neck: Normal range of motion. Neck supple.  Cardiovascular: Normal rate, regular rhythm, normal heart sounds and intact distal pulses. Exam reveals no gallop and no friction rub.  No murmur heard. Pulses:      Radial pulses are 2+ on the right side, and 2+ on the left side.  Pulmonary/Chest: Effort normal and breath sounds normal. No respiratory distress. She has no decreased breath sounds. She has no wheezes. She has no rhonchi. She has no rales.  Abdominal: Soft. Bowel sounds are normal. She exhibits no distension and no mass. There is no hepatosplenomegaly. There is no tenderness. There is no rebound and no guarding.  Genitourinary:  Genitourinary Comments: Large non thrombosed external hemorrhoid. No area of fluctuance or induration noted on exam but pt is very tender just left of anus. No drainage or redness appreciated.  Musculoskeletal: Normal range of motion. She exhibits no edema, tenderness or deformity.  Neurological: She is alert and oriented to person, place, and time.  Skin: Skin is warm and dry. No rash noted. She is not diaphoretic.  Psychiatric: Mood and affect normal.  Nursing note and vitals reviewed.   Results for orders placed or performed during the hospital encounter of 01/16/18 (from the past 48 hour(s))  CBC     Status: Abnormal   Collection Time: 01/16/18  8:54 AM  Result Value Ref Range   WBC 5.2 4.0 - 10.5 K/uL   RBC 4.55 3.87 - 5.11 MIL/uL   Hemoglobin 7.2 (L) 12.0 - 15.0 g/dL   HCT 26.0 (L) 36.0 - 46.0 %   MCV 57.1 (L) 78.0 - 100.0 fL   MCH 15.8 (L) 26.0 - 34.0 pg   MCHC 27.7 (L) 30.0 - 36.0 g/dL   RDW 22.0 (H) 11.5 - 15.5 %   Platelets 577 (H) 150 - 400  K/uL    Comment: PLATELET COUNT CONFIRMED BY SMEAR  I-Stat beta hCG blood, ED     Status: None   Collection Time: 01/16/18  9:04 AM  Result Value Ref Range   I-stat hCG, quantitative <5.0 <5 mIU/mL   Comment 3            Comment:   GEST. AGE      CONC.  (mIU/mL)   <=1 WEEK        5 - 50     2 WEEKS       50 - 500     3 WEEKS       100 - 10,000     4 WEEKS     1,000 - 30,000        FEMALE AND NON-PREGNANT FEMALE:     LESS THAN 5 mIU/mL  Basic metabolic panel     Status: None   Collection Time: 01/16/18  9:36 AM  Result Value Ref Range   Sodium 140 135 - 145 mmol/L   Potassium 3.9 3.5 - 5.1 mmol/L   Chloride 111 101 - 111 mmol/L   CO2 22 22 - 32 mmol/L   Glucose, Bld 86 65 - 99 mg/dL   BUN 9 6 - 20 mg/dL   Creatinine, Ser 0.60 0.44 - 1.00 mg/dL   Calcium 8.9 8.9 - 10.3 mg/dL   GFR calc non Af Amer >60 >60 mL/min   GFR calc Af Amer >60 >60 mL/min    Comment: (NOTE) The eGFR has been calculated using the CKD EPI equation. This calculation has not been validated in all clinical situations. eGFR's persistently <60 mL/min signify possible Chronic Kidney Disease.    Anion gap 7 5 - 15   Dg Chest 2 View  Result Date: 01/16/2018 CLINICAL DATA:  Productive cough. EXAM: CHEST  2 VIEW COMPARISON:  Radiographs of October 26, 2017. FINDINGS: The heart size and mediastinal contours are within normal limits. Both lungs are clear. No pneumothorax or pleural effusion is noted. The visualized skeletal structures are unremarkable. IMPRESSION: No active cardiopulmonary disease. Electronically Signed   By: Marijo Conception, M.D.   On: 01/16/2018 11:31   Ct Pelvis W Contrast  Result Date: 01/16/2018 CLINICAL DATA:  Pt has a known "knot" between anus and vagina, on floor. This has been there approximately 1 year, but is growing, and pain is much worse. EXAM: CT PELVIS WITH CONTRAST TECHNIQUE: Multidetector CT imaging of the pelvis was performed using the standard protocol following the bolus  administration of intravenous contrast. CONTRAST:  <See Chart> ISOVUE-300 IOPAMIDOL (ISOVUE-300) INJECTION 61% COMPARISON:  12/12/2005 FINDINGS: Urinary Tract:  Bladder decompressed. Bowel:  No bowel dilatation within the visualized pelvis. Vascular/Lymphatic: No evidence for atherosclerosis. No pelvic sidewall lymphadenopathy. Reproductive:  Uterus unremarkable.  No adnexal mass. Other:  Trace free fluid evident in the cul-de-sac. Musculoskeletal: 1.7 x 2.3 cm thick walled, irregular lesion is identified in the left ischial anal fat, immediately adjacent to the anus. Central low attenuation suggests a fluid component or necrosis. No definite fistulous tract by CT. No soft tissue gas Bone windows reveal no worrisome lytic or sclerotic osseous lesions. IMPRESSION: 1.7 x 2.3 cm thick walled left perianal lesion likely related to infected perianal gland. No classic CT features of perianal fistula at this time. Electronically Signed   By: Misty Stanley M.D.   On: 01/16/2018 12:37      Assessment/Plan  Hx of iron deficiency anemia - Hg 7.2 - pt states her baseline is around 7-8  Perianal abscess - OR today for I&D - afebrile, WBC WNL - Unysn   Thank you for the consult.   Kalman Drape, [facility] 01/16/2018, 1:47 PM Pager: [phone] Consults: [phone] Mon-Fri 7:00 am-4:30 pm Sat-Sun 7:00 am-11:30 am

## 2018-01-16 NOTE — ED Notes (Signed)
Multiple attempts to start IV. Another nurse to look

## 2018-01-17 ENCOUNTER — Encounter (HOSPITAL_COMMUNITY): Payer: Self-pay | Admitting: General Practice

## 2018-01-17 ENCOUNTER — Other Ambulatory Visit: Payer: Self-pay

## 2018-01-17 LAB — CBC
HCT: 22.7 % — ABNORMAL LOW (ref 36.0–46.0)
Hemoglobin: 6.2 g/dL — CL (ref 12.0–15.0)
MCH: 15.7 pg — AB (ref 26.0–34.0)
MCHC: 27.3 g/dL — ABNORMAL LOW (ref 30.0–36.0)
MCV: 57.6 fL — ABNORMAL LOW (ref 78.0–100.0)
Platelets: 397 10*3/uL (ref 150–400)
RBC: 3.94 MIL/uL (ref 3.87–5.11)
RDW: 21.8 % — ABNORMAL HIGH (ref 11.5–15.5)
WBC: 4.8 10*3/uL (ref 4.0–10.5)

## 2018-01-17 LAB — HIV ANTIBODY (ROUTINE TESTING W REFLEX): HIV Screen 4th Generation wRfx: NONREACTIVE

## 2018-01-17 MED ORDER — MORPHINE SULFATE (PF) 4 MG/ML IV SOLN: 2.0000 mg | INTRAVENOUS | Status: DC | PRN

## 2018-01-17 MED ORDER — ACETAMINOPHEN 325 MG PO TABS: 650.0000 mg | ORAL_TABLET | Freq: Four times a day (QID) | ORAL | Status: DC | PRN

## 2018-01-17 MED ORDER — IBUPROFEN 600 MG PO TABS
600.0000 mg | ORAL_TABLET | Freq: Four times a day (QID) | ORAL | Status: DC
  Administered 2018-01-17: 600 mg via ORAL
  Filled 2018-01-17: qty 1

## 2018-01-17 MED ORDER — CIPROFLOXACIN HCL 500 MG PO TABS: 500.0000 mg | ORAL_TABLET | Freq: Two times a day (BID) | ORAL | 0 refills | Status: AC

## 2018-01-17 MED ORDER — OXYCODONE HCL 5 MG PO TABS: 5.0000 mg | ORAL_TABLET | Freq: Four times a day (QID) | ORAL | 0 refills | Status: DC | PRN

## 2018-01-17 MED ORDER — ACETAMINOPHEN 325 MG PO TABS
650.0000 mg | ORAL_TABLET | Freq: Four times a day (QID) | ORAL | Status: DC
  Administered 2018-01-17: 650 mg via ORAL
  Filled 2018-01-17: qty 2

## 2018-01-17 MED ORDER — METRONIDAZOLE 500 MG PO TABS: 500.0000 mg | ORAL_TABLET | Freq: Three times a day (TID) | ORAL | 0 refills | Status: DC

## 2018-01-17 NOTE — Anesthesia Postprocedure Evaluation (Signed)
Anesthesia Post Note  Patient: [name] [name]  Procedure(s) Performed: IRRIGATION AND DEBRIDEMENT PERIRECTAL ABSCESS (N/A Rectum)     Patient location during evaluation: PACU Anesthesia Type: General Level of consciousness: awake and alert Pain management: pain level controlled Vital Signs Assessment: post-procedure vital signs reviewed and stable Respiratory status: spontaneous breathing, nonlabored ventilation, respiratory function stable and patient connected to nasal cannula oxygen Cardiovascular status: blood pressure returned to baseline and stable Postop Assessment: no apparent nausea or vomiting Anesthetic complications: no    Last Vitals:  Vitals:   01/17/18 0213 01/17/18 0527  BP: (!) 92/51 (!) 99/48  Pulse: (!) 56 (!) 53  Resp: 18 18  Temp: 36.8 C 36.9 C  SpO2: 100% 100%    Last Pain:  Vitals:   01/17/18 0527  TempSrc: Oral  PainSc:                  [name]

## 2018-01-17 NOTE — Progress Notes (Signed)
Pt for discharge going home, given health teachings, next appt, prescriptions, personal belongings, discontinued peripheral IV line, given pain meds prior to discharge, alert and oriented no s/s of distress noted.

## 2018-01-17 NOTE — Care Management Note (Signed)
Case Management Note  Patient Details  Name: [name] [name] MRN: [id number] Date of Birth: 09/29/1986  Subjective/Objective:                    Action/Plan:  Confirmed with patient she is uninsured . Provided MATCH letter and [facility] information.   Above explained to patient and patient voiced understanding. Expected Discharge Date:  01/17/18               Expected Discharge Plan:  Home/Self Care  In-House Referral:     Discharge planning Services  CM Consult, [facility], [facility], Medication Assistance  Post Acute Care Choice:  NA Choice offered to:  Patient  DME Arranged:  N/A DME Agency:  NA  HH Arranged:  NA HH Agency:  NA  Status of Service:  Completed, signed off  If discussed at Long Length of Stay Meetings, dates discussed:    Additional Comments:  Kingsley PlanWile, [name], RN 01/17/2018, 11:39 AM

## 2018-01-17 NOTE — Discharge Instructions (Signed)
Disposable Sitz Bath A disposable sitz bath is a plastic basin that fits over the toilet. A bag is hung above the toilet, and the bag is connected to a tube that opens into the basin. The bag is filled with warm water that flows into the basin through the tube. A sitz bath can be used to help relieve symptoms, clean, and promote healing in the genital and anal areas, as well as in the lower abdomen and buttocks. What are the risks? Sitz baths are generally very safe. It is possible for the skin between the genitals and the anus (perineum) to become infected, but this is rare. You can avoid this by cleaning your sitz bath supplies thoroughly. How to use a disposable sitz bath 1. Close the clamp on the tube. Make sure the clamp is closed tightly to prevent leakage. 2. Fill the sitz bath basin and the plastic bag with warm water. The water should be warm enough to be comfortable, but not hot. 3. Raise the toilet seat and place the filled basin on the toilet. Make sure the overflow opening is facing toward the back of the toilet. ? If you prefer, you may place the empty basin on the toilet first, and then use the plastic bag to fill the basin with warm water. 4. Hang the filled plastic bag overhead on a hook or towel rack close to the toilet. The bag should be higher than the toilet so that the water will flow down through the tube. 5. Attach the tube to the opening on the basin. Make sure that the tube is attached to the basin tightly to prevent leakage. 6. Sit on the basin and release the clamp. This will allow warm water to flow into the basin and flush the area around your genitals and anus. 7. Remain sitting on the basin for about 15-20 minutes, or as long as told by your health care provider. 8. Stand up and gently pat your skin dry. If directed, apply clean bandages (dressings) to the affected area as told by your health care provider. 9. Carefully remove the basin from the toilet seat and tip the  basin into the toilet to empty any remaining water. Empty any remaining water from the plastic bag into the toilet. Then, flush the toilet. 10. Wash the basin with warm water and soap. Let the basin air dry in the sink. You should also let the plastic bag and the tubing air dry. 11. Store the basin, tubing, and plastic bag in a clean, dry area. 12. Wash your hands with soap and water. If soap and water are not available, use hand sanitizer. Contact a health care provider if:  You have symptoms that get worse instead of better.  You develop new skin irritation, redness, or swelling around your genitals or anus. This information is not intended to replace advice given to you by your health care provider. Make sure you discuss any questions you have with your health care provider. Document Released: 06/04/2012 Document Revised: 05/11/2016 Document Reviewed: 10/24/2015 Elsevier Interactive Patient Education  2018 [employer].    Perianal Abscess An abscess is an infected area that is filled with pus. A perianal abscess occurs in the perineum, which is the area between the anus and the scrotum in males and between the anus and the vagina in females. Perianal abscesses can vary in size. Without treatment, a perianal abscess can become larger and cause other problems. What are the causes? This condition is caused by:  Waste from damaged or dead tissue (debris) that plugs up glands in the perineum. When this happens, an abscess may form.  Infections of the perineum.  What are the signs or symptoms? Common symptoms of this condition include:  Swelling and redness in the area of the abscess. The redness may go beyond the abscess and appear as a red streak on the skin.  Pain in the area of the abscess, including pain when sitting, walking, or passing stool.  Other possible symptoms include:  A visible, painful lump, or a lump that can be felt when touched.  Bleeding or pus-like discharge  from the area.  Fever.  General weakness.  How is this diagnosed? This condition is diagnosed based on your medical history and a physical exam of the affected area.  This may involve examining the rectal area with a gloved hand (digital rectal exam).  Sometimes, the health care provider needs to look into the rectum using a probe or a scope.  For women, it may require a careful vaginal exam.  How is this treated? Treatment for this condition may include:  Making a cut (incision) in the abscess to drain the pus. This can sometimes be done in your health care provider's office or an emergency department after you are given medicine to numb the area (local anesthetic).  Surgery to drain the abscess. This is for larger or deeper abscesses.  Antibiotic medicines, if there is infection in the surrounding tissue (cellulitis).  Having gauze packed into the abscess to continue draining the area.  Frequent baths in warm water that is deep enough to cover your hips and buttocks (sitz baths). These help the wound heal and they make the abscess less likely to come back.  Follow these instructions at home: Medicines  Take over-the-counter and prescription medicines for pain, fever, or discomfort only as told by your health care provider.  If you were prescribed an antibiotic medicine, use it as told by your health care provider. Do not stop using the antibiotic even if you start to feel better.  Do not drive or use heavy machinery while taking prescription pain medicine. Wound care   Keep the skin around the wound clean and dry. Avoid cleaning the area too much.  Avoid scratching the wound.  Avoid using colored or perfumed toilet papers.  Take a sitz bath 3-4 times a day and after bowel movements. This will help reduce pain and swelling.  If directed, apply ice to the injured area: ? Put ice in a plastic bag. ? Place a towel between your skin and the bag. ? Leave the ice on for  20 minutes, 2-3 times a day.  Check your incision area every day for signs of infection. Check for: ? More redness, swelling, or pain. ? More fluid or blood. ? Warmth. ? Pus or a bad smell. Gauze  If gauze was used in the abscess, follow instructions from your health care provider about removing or changing the gauze. It can usually be removed in 2-3 days.  Wash your hands with soap and water before you remove or change your gauze. If soap and water are not available, use hand sanitizer.  If one or more drains were placed in the abscess cavity, be careful not to pull at them. Your health care provider will tell you how long they need to remain in place. General instructions  Keep all follow-up visits as told by your health care provider. This is important. Contact a health care  provider if:  You have trouble passing stool or passing urine.  Your pain or swelling in the affected area does not seem to be getting better.  The gauze packing or the drains come out before the planned time. Get help right away if:  You have problems moving or using your legs.  You have severe or increasing pain.  Your swelling in the affected area suddenly gets worse.  You have a large increase in bleeding or passing of pus.  You have chills or a fever. This information is not intended to replace advice given to you by your health care provider. Make sure you discuss any questions you have with your health care provider. Document Released: 01/10/2007 Document Revised: 06/23/2016 Document Reviewed: 05/15/2016 Elsevier Interactive Patient Education  [employer]2018 Elsevier Inc.

## 2018-01-17 NOTE — Progress Notes (Signed)
Pt hgb 6.2 MD aware no new order at this time.

## 2018-01-17 NOTE — Progress Notes (Addendum)
CRITICAL VALUE ALERT  Critical Value:  Hemoglobin at 6.2  Date & Time Notied:  01/17/18, [id number]  Provider Notified: MD [facility]  Orders Received/Actions taken: still waiting for call back. Endorsed to next nurse.

## 2018-01-17 NOTE — Discharge Summary (Signed)
[facility] Discharge Summary   Patient ID: [name] [name] MRN: [id number] DOB/AGE: 02-14-1986 32 y.o.  Admit date: 01/16/2018 Discharge date: 01/17/2018  Admitting Diagnosis: Perianal abscess  Discharge Diagnosis Patient Active Problem List   Diagnosis Date Noted  . Perianal abscess 01/16/2018  . NSVD (normal spontaneous vaginal delivery) 04/29/2016  . Active labor 04/27/2016  . 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 Drug overdose 06/19/2012  . Suicide attempt (HCC) 06/19/2012  . UTI (lower urinary tract infection) 06/19/2012  . Microcytic anemia 06/17/2012  . Hypokalemia 06/17/2012  . 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  . Tobacco abuse 06/17/2012    Consultants None  Imaging: Dg Chest 2 View  Result Date: 01/16/2018 CLINICAL DATA:  Productive cough. EXAM: CHEST  2 VIEW COMPARISON:  Radiographs of October 26, 2017. FINDINGS: The heart size and mediastinal contours are within normal limits. Both lungs are clear. No pneumothorax or pleural effusion is noted. The visualized skeletal structures are unremarkable. IMPRESSION: No active cardiopulmonary disease. Electronically Signed   By: Lupita Raider, M.D.   On: 01/16/2018 11:31   Ct Pelvis W Contrast  Result Date: 01/16/2018 CLINICAL DATA:  Pt has a known "knot" between anus and vagina, on floor. This has been there approximately 1 year, but is growing, and pain is much worse. EXAM: CT PELVIS WITH CONTRAST TECHNIQUE: Multidetector CT imaging of the pelvis was performed using the standard protocol following the bolus administration of intravenous contrast. CONTRAST:  <See Chart> ISOVUE-300 IOPAMIDOL (ISOVUE-300) INJECTION 61% COMPARISON:  12/12/2005 FINDINGS: Urinary Tract:  Bladder decompressed. Bowel:  No bowel dilatation within the visualized pelvis. Vascular/Lymphatic: No evidence for atherosclerosis. No pelvic sidewall lymphadenopathy. Reproductive:  Uterus unremarkable.  No  adnexal mass. Other:  Trace free fluid evident in the cul-de-sac. Musculoskeletal: 1.7 x 2.3 cm thick walled, irregular lesion is identified in the left ischial anal fat, immediately adjacent to the anus. Central low attenuation suggests a fluid component or necrosis. No definite fistulous tract by CT. No soft tissue gas Bone windows reveal no worrisome lytic or sclerotic osseous lesions. IMPRESSION: 1.7 x 2.3 cm thick walled left perianal lesion likely related to infected perianal gland. No classic CT features of perianal fistula at this time. Electronically Signed   By: Kennith Center M.D.   On: 01/16/2018 12:37    Procedures Dr. Sheliah Hatch (01/16/17) - incision and drainage of perirectal abscess  Hospital Course:  [name] [name] is a 32yo female who presented to [facility] 1/30 with 3 days of rectal pain.  CT scan showed 1.7 x 2.3 cm thick walled left perianal lesion likely related to infected perianal gland.  Patient was admitted and underwent procedure listed above. Intraoperative cultures were taken. Tolerated procedure well and was transferred to the floor.  Diet was advanced as tolerated.  She was instructed on how to perform sitz baths. On POD1, the patient was voiding well, tolerating diet, ambulating well, pain well controlled, vital signs stable, incisions c/d/i and felt stable for discharge home. She will go home with a prescription for cipro and flagyl. Patient will follow up in our office in about 1 week and knows to call with questions or concerns.    I have personally reviewed the patients medication history on the [place] controlled substance database.   Physical Exam: General:  Alert, NAD, pleasant, comfortable Pulm: effort normal GU: perianal abscess s/p I&D with penrose drain in place, no fluctuance or erythema  Allergies as of 01/17/2018  Reactions   Amoxicillin Nausea And Vomiting   Has patient had a PCN reaction causing immediate rash, facial/tongue/throat swelling, SOB or  lightheadedness with hypotension: No Has patient had a PCN reaction causing severe rash involving mucus membranes or skin necrosis: No Has patient had a PCN reaction that required hospitalization Yes Has patient had a PCN reaction occurring within the last 10 years: Yes If all of the above answers are "NO", then may proceed with Cephalosporin use.   Penicillins Nausea And Vomiting   Has patient had a PCN reaction causing immediate rash, facial/tongue/throat swelling, SOB or lightheadedness with hypotension: No Has patient had a PCN reaction causing severe rash involving mucus membranes or skin necrosis: No Has patient had a PCN reaction that required hospitalization Yes Has patient had a PCN reaction occurring within the last 10 years: No If all of the above answers are "NO", then may proceed with Cephalosporin use.      Medication List    STOP taking these medications   azithromycin 250 MG tablet Commonly known as:  ZITHROMAX     TAKE these medications   ciprofloxacin 500 MG tablet Commonly known as:  CIPRO Take 1 tablet (500 mg total) by mouth 2 (two) times daily for 5 days.   ferrous sulfate 325 (65 FE) MG tablet Take 325 mg by mouth daily with breakfast.   ibuprofen 600 MG tablet Commonly known as:  ADVIL,MOTRIN Take 1 tablet (600 mg total) by mouth every 6 (six) hours.   metroNIDAZOLE 500 MG tablet Commonly known as:  FLAGYL Take 1 tablet (500 mg total) by mouth 3 (three) times daily.   oxyCODONE 5 MG immediate release tablet Commonly known as:  Oxy IR/ROXICODONE Take 1-2 tablets (5-10 mg total) by mouth every 6 (six) hours as needed for severe pain.        Follow-up Information    Kinsinger, De BlanchLuke Aaron, MD. Call in 1 week(s).   Specialty:  General Surgery Why:  We are working on your appointment, please call to confirm. Please arrive 30 minutes prior to your appointment to check in and fill out paperwork. Bring photo ID and insurance information. Contact  information: [address] [place] [address] [phone]           Signed: Franne FortsBrooke A Raider Valbuena, [facility] 01/17/2018, 10:50 AM Pager: [phone] Consults: [phone] Mon-Fri 7:00 am-4:30 pm Sat-Sun 7:00 am-11:30 am

## 2018-01-20 LAB — AEROBIC/ANAEROBIC CULTURE W GRAM STAIN (SURGICAL/DEEP WOUND)

## 2018-01-20 LAB — AEROBIC/ANAEROBIC CULTURE (SURGICAL/DEEP WOUND)

## 2018-02-02 ENCOUNTER — Encounter (HOSPITAL_COMMUNITY): Payer: Self-pay | Admitting: *Deleted

## 2018-02-02 ENCOUNTER — Emergency Department (HOSPITAL_COMMUNITY)
Admission: EM | Admit: 2018-02-02 | Discharge: 2018-02-02 | Disposition: A | Discharge status: Home / Self Care | Payer: Medicaid Other | Attending: Emergency Medicine | Admitting: Emergency Medicine

## 2018-02-02 ENCOUNTER — Other Ambulatory Visit: Payer: Self-pay

## 2018-02-02 DIAGNOSIS — Y939 Activity, unspecified: Secondary | ICD-10-CM | POA: Diagnosis not present

## 2018-02-02 DIAGNOSIS — S025XXA Fracture of tooth (traumatic), initial encounter for closed fracture: Secondary | ICD-10-CM | POA: Insufficient documentation

## 2018-02-02 DIAGNOSIS — X58XXXA Exposure to other specified factors, initial encounter: Secondary | ICD-10-CM | POA: Diagnosis not present

## 2018-02-02 DIAGNOSIS — K029 Dental caries, unspecified: Secondary | ICD-10-CM | POA: Diagnosis not present

## 2018-02-02 DIAGNOSIS — K047 Periapical abscess without sinus: Secondary | ICD-10-CM | POA: Insufficient documentation

## 2018-02-02 DIAGNOSIS — Z87891 Personal history of nicotine dependence: Secondary | ICD-10-CM | POA: Insufficient documentation

## 2018-02-02 DIAGNOSIS — Y999 Unspecified external cause status: Secondary | ICD-10-CM | POA: Diagnosis not present

## 2018-02-02 DIAGNOSIS — Z79899 Other long term (current) drug therapy: Secondary | ICD-10-CM | POA: Diagnosis not present

## 2018-02-02 DIAGNOSIS — S0993XA Unspecified injury of face, initial encounter: Secondary | ICD-10-CM | POA: Diagnosis present

## 2018-02-02 DIAGNOSIS — Y929 Unspecified place or not applicable: Secondary | ICD-10-CM | POA: Diagnosis not present

## 2018-02-02 MED ORDER — IBUPROFEN 800 MG PO TABS: 800.0000 mg | ORAL_TABLET | Freq: Three times a day (TID) | ORAL | 0 refills | Status: DC | PRN

## 2018-02-02 MED ORDER — CLINDAMYCIN HCL 150 MG PO CAPS: 150.0000 mg | ORAL_CAPSULE | Freq: Four times a day (QID) | ORAL | 0 refills | Status: DC

## 2018-02-02 MED ORDER — HYDROCODONE-ACETAMINOPHEN 5-325 MG PO TABS: 1.0000 | ORAL_TABLET | Freq: Four times a day (QID) | ORAL | 0 refills | Status: DC | PRN

## 2018-02-02 MED ORDER — HYDROCODONE-ACETAMINOPHEN 5-325 MG PO TABS
1.0000 | ORAL_TABLET | Freq: Once | ORAL | Status: AC
  Administered 2018-02-02: 1 via ORAL
  Filled 2018-02-02: qty 1

## 2018-02-02 NOTE — ED Notes (Signed)
Declined W/C at D/C and was escorted to lobby by RN. 

## 2018-02-02 NOTE — ED Triage Notes (Signed)
Pt has severe left side dental pain. Airway intact at triage.

## 2018-02-02 NOTE — ED Triage Notes (Addendum)
PT wants the letter to get her medications for $3.00 like she had last time.

## 2018-02-02 NOTE — Discharge Instructions (Signed)
Follow-up with your dentist.  Return here as needed.  Rinse with warm water and peroxide 3 times a day.  Use heat over the area that is swollen.

## 2018-02-02 NOTE — ED Provider Notes (Signed)
[facility] EMERGENCY DEPARTMENT Provider Note   CSN: [id number] Arrival date & time: 02/02/18  1339     History   Chief Complaint Chief Complaint  Patient presents with  . Dental Pain    HPI [name] [name] is a 32 y.o. female.  HPI Patient presents to the emergency department with dental pain that started 3 days ago.  The patient states that the pain got worse last night.  The patient states nothing seems to make the condition better.  States palpation of her jaw makes the pain worse.  The patient states she did not take any medications prior to arrival.  The patient denies chest pain, shortness of breath, headache,blurred vision, neck pain, fever, cough, weakness, numbness, dizziness, anorexia, edema, abdominal pain, nausea, vomiting, diarrhea, rash, back pain,  near syncope, or syncope. Past Medical History:  Diagnosis Date  . Abscess of axilla   . Anemia   . Anxiety   . Blood transfusion without reported diagnosis   . Depression    bipolar; not on meds  . Headache(784.0)   . Heart murmur   . Hemorrhoids   . Perianal abscess 12/2017  . Substance abuse [facility])     Patient Active Problem List   Diagnosis Date Noted  . Perianal abscess 01/16/2018  . NSVD (normal spontaneous vaginal delivery) 04/29/2016  . Active labor 04/27/2016  . 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 Drug overdose 06/19/2012  . Suicide attempt (HCC) 06/19/2012  . UTI (lower urinary tract infection) 06/19/2012  . Microcytic anemia 06/17/2012  . Hypokalemia 06/17/2012  . 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  . Tobacco abuse 06/17/2012    Past Surgical History:  Procedure Laterality Date  . DENTAL SURGERY    . INCISE AND DRAIN ABCESS  01/16/2018  . INCISION AND DRAINAGE PERIRECTAL ABSCESS N/A 01/16/2018   Procedure: IRRIGATION AND DEBRIDEMENT PERIRECTAL ABSCESS;  Surgeon: Sheliah Hatch, De Blanch, MD;  Location: [facility] OR;  Service: General;   Laterality: N/A;    OB History    Gravida Para Term Preterm AB Living   4 3 2 1 1 3    SAB TAB Ectopic Multiple Live Births   1 0 0 0 3       Home Medications    Prior to Admission medications   Medication Sig Start Date End Date Taking? Authorizing Provider  ferrous sulfate 325 (65 FE) MG tablet Take 325 mg by mouth daily with breakfast.    [provider]  ibuprofen (ADVIL,MOTRIN) 600 MG tablet Take 1 tablet (600 mg total) by mouth every 6 (six) hours. Patient not taking: Reported on 01/16/2018 04/29/16   Federico Flake, MD  metroNIDAZOLE (FLAGYL) 500 MG tablet Take 1 tablet (500 mg total) by mouth 3 (three) times daily. 01/17/18   Meuth, Brooke A, PA-C  oxyCODONE (OXY IR/ROXICODONE) 5 MG immediate release tablet Take 1-2 tablets (5-10 mg total) by mouth every 6 (six) hours as needed for severe pain. 01/17/18   Meuth, Lina Sar, PA-C    Family History Family History  Problem Relation Age of Onset  . Hypertension Mother   . Diabetes type II Mother   . Arthritis Mother   . Hyperlipidemia Mother   . Cancer Father        lung    Social History Social History   Tobacco Use  . Smoking status: Former Smoker    Packs/day: 0.50    Years: 9.00    Pack years: 4.50  Types: Cigarettes    Last attempt to quit: 01/14/2018    Years since quitting: 0.0  . Smokeless tobacco: Never Used  Substance Use Topics  . Alcohol use: Yes    Comment: rare  . Drug use: Yes    Frequency: 3.0 times per week    Types: Marijuana, Cocaine     Allergies   Amoxicillin and Penicillins   Review of Systems Review of Systems All other systems negative except as documented in the HPI. All pertinent positives and negatives as reviewed in the HPI.  Physical Exam Updated Vital Signs BP 115/67 (BP Location: Right Arm)   Pulse 65   Temp 99.3 F (37.4 C) (Oral)   Resp 16   Ht 5' 5.5" (1.664 m)   Wt 54.4 kg (120 lb)   LMP 01/11/2018 (Exact Date)   SpO2 100%   BMI 19.67 kg/m    Physical Exam  Constitutional: She is oriented to person, place, and time. She appears well-developed and well-nourished. No distress.  HENT:  Head: Normocephalic and atraumatic.  Mouth/Throat:    Eyes: Pupils are equal, round, and reactive to light.  Pulmonary/Chest: Effort normal.  Neurological: She is alert and oriented to person, place, and time.  Skin: Skin is warm and dry.  Psychiatric: She has a normal mood and affect.  Nursing note and vitals reviewed.    ED Treatments / Results  Labs (all labs ordered are listed, but only abnormal results are displayed) Labs Reviewed - No data to display  EKG  EKG Interpretation None       Radiology No results found.  Procedures Procedures (including critical care time)  Medications Ordered in ED Medications  HYDROcodone-acetaminophen (NORCO/VICODIN) 5-325 MG per tablet 1 tablet (1 tablet Oral Given 02/02/18 1634)     Initial Impression / Assessment and Plan / ED Course  I have reviewed the triage vital signs and the nursing notes.  Pertinent labs & imaging results that were available during my care of the patient were reviewed by me and considered in my medical decision making (see chart for details).    Patient will be given treatment for dental abscess and referred to her dentist.  The patient is advised to return here for any worsening in her condition.  Patient agrees to the plan and all questions were answered there does not appear to be any Ludwig's  angina or deep space infection of the neck at this point as there is no neck swelling or decreased range of motion of the neck.   Final Clinical Impressions(s) / ED Diagnoses   Final diagnoses:  None    ED Discharge Orders    None       Charlestine NightLawyer, Aloysious Vangieson, PA-C 02/02/18 1641    Alvira MondaySchlossman, Erin, MD 02/03/18 2018

## 2018-06-12 ENCOUNTER — Encounter (HOSPITAL_COMMUNITY): Payer: Self-pay

## 2018-06-12 ENCOUNTER — Ambulatory Visit (HOSPITAL_COMMUNITY)
Admission: EM | Admit: 2018-06-12 | Discharge: 2018-06-12 | Disposition: A | Discharge status: Home / Self Care | Payer: Medicaid Other | Attending: Urgent Care | Admitting: Urgent Care

## 2018-06-12 DIAGNOSIS — K0889 Other specified disorders of teeth and supporting structures: Secondary | ICD-10-CM

## 2018-06-12 DIAGNOSIS — K0381 Cracked tooth: Secondary | ICD-10-CM

## 2018-06-12 DIAGNOSIS — K047 Periapical abscess without sinus: Secondary | ICD-10-CM

## 2018-06-12 MED ORDER — TRAMADOL-ACETAMINOPHEN 37.5-325 MG PO TABS: 1.0000 | ORAL_TABLET | Freq: Four times a day (QID) | ORAL | 0 refills | Status: DC | PRN

## 2018-06-12 MED ORDER — CLINDAMYCIN HCL 150 MG PO CAPS: 150.0000 mg | ORAL_CAPSULE | Freq: Three times a day (TID) | ORAL | 0 refills | Status: DC

## 2018-06-12 MED ORDER — CLINDAMYCIN HCL 300 MG PO CAPS: 300.0000 mg | ORAL_CAPSULE | Freq: Three times a day (TID) | ORAL | 0 refills | Status: DC

## 2018-06-12 NOTE — Discharge Instructions (Addendum)
Hydrate well with at least 2 liters (1 gallon) of water daily. Eat soups, push fluids. You may take 500mg  Tylenol with ibuprofen 600mg  every 6 hours for pain and inflammation.  Use Ultracet for breakthrough pain, that has pain not controlled by Tylenol and ibuprofen.

## 2018-06-12 NOTE — ED Provider Notes (Addendum)
  MRN: [id number] DOB: 10/02/1986  Subjective:   [name] [name] is a 32 y.o. female presenting for several day history of worsening left lower tooth pain, jaw pain, left facial swelling and pain.  Now having a headache from her pain, has not been able to chew or eat well.  Has gone through a bottle of ibuprofen for pain and inflammation.  Has also used Orajel, Tylenol.  Patient tried to schedule an appointment with her dental surgeon, last office visit was a couple years ago.  States that she has an appointment in 1.5 weeks.    Allergies  Allergen Reactions  . Amoxicillin Nausea And Vomiting    Has patient had a PCN reaction causing immediate rash, facial/tongue/throat swelling, SOB or lightheadedness with hypotension: No Has patient had a PCN reaction causing severe rash involving mucus membranes or skin necrosis: No Has patient had a PCN reaction that required hospitalization Yes Has patient had a PCN reaction occurring within the last 10 years: Yes If all of the above answers are "NO", then may proceed with Cephalosporin use.  Marland Kitchen. Penicillins Nausea And Vomiting    Has patient had a PCN reaction causing immediate rash, facial/tongue/throat swelling, SOB or lightheadedness with hypotension: No Has patient had a PCN reaction causing severe rash involving mucus membranes or skin necrosis: No Has patient had a PCN reaction that required hospitalization Yes Has patient had a PCN reaction occurring within the last 10 years: No If all of the above answers are "NO", then may proceed with Cephalosporin use.     Past Medical History:  Diagnosis Date  . Abscess of axilla   . Anemia   . Anxiety   . Blood transfusion without reported diagnosis   . Depression    bipolar; not on meds  . Headache(784.0)   . Heart murmur   . Hemorrhoids   . Perianal abscess 12/2017  . Substance abuse [facility](HCC)      Past Surgical History:  Procedure Laterality Date  . DENTAL SURGERY    . INCISE AND DRAIN ABCESS   01/16/2018  . INCISION AND DRAINAGE PERIRECTAL ABSCESS N/A 01/16/2018   Procedure: IRRIGATION AND DEBRIDEMENT PERIRECTAL ABSCESS;  Surgeon: Rodman PickleKinsinger, Luke Aaron, MD;  Location: [facility] OR;  Service: General;  Laterality: N/A;    Objective:   Vitals: BP (!) 111/53 (BP Location: Left Arm)   Pulse 73   Temp 98.2 F (36.8 C) (Temporal)   Resp 19   LMP 06/05/2018   SpO2 100%   Physical Exam  Constitutional: She is oriented to person, place, and time. She appears well-developed and well-nourished.  HENT:  Head:    Mouth/Throat:    Cardiovascular: Normal rate.  Pulmonary/Chest: Effort normal.  Neurological: She is alert and oriented to person, place, and time.   Assessment and Plan :   Dental infection  Dentalgia  Cracked tooth  Will start patient on clindamycin given her allergies to amoxicillin and penicillin.  Patient is scheduled Tylenol and ibuprofen for pain and inflammation.  Use Ultracet for breakthrough pain.  Emphasized need to maintain her appointment with the dental surgeon.  Patient refused IM Toradol in clinic.   Wallis BambergMani, Janetta Vandoren, [place]-C 06/12/18 1646

## 2018-06-12 NOTE — ED Triage Notes (Signed)
Pt presents with left tooth pain, jaw pain and facial pain.

## 2018-07-03 ENCOUNTER — Emergency Department (HOSPITAL_COMMUNITY): Payer: Medicaid Other

## 2018-07-03 ENCOUNTER — Encounter (HOSPITAL_COMMUNITY): Payer: Self-pay | Admitting: Emergency Medicine

## 2018-07-03 ENCOUNTER — Inpatient Hospital Stay (HOSPITAL_COMMUNITY)
Admission: EM | Admit: 2018-07-03 | Discharge: 2018-07-05 | DRG: 776 | Disposition: A | Discharge status: Home / Self Care | Payer: Medicaid Other | Attending: Family Medicine | Admitting: Family Medicine

## 2018-07-03 DIAGNOSIS — Z590 Homelessness: Secondary | ICD-10-CM

## 2018-07-03 DIAGNOSIS — K610 Anal abscess: Secondary | ICD-10-CM | POA: Diagnosis present

## 2018-07-03 DIAGNOSIS — F1410 Cocaine abuse, uncomplicated: Secondary | ICD-10-CM

## 2018-07-03 DIAGNOSIS — K047 Periapical abscess without sinus: Secondary | ICD-10-CM | POA: Diagnosis present

## 2018-07-03 DIAGNOSIS — O26892 Other specified pregnancy related conditions, second trimester: Secondary | ICD-10-CM

## 2018-07-03 DIAGNOSIS — O2242 Hemorrhoids in pregnancy, second trimester: Secondary | ICD-10-CM | POA: Diagnosis present

## 2018-07-03 DIAGNOSIS — R109 Unspecified abdominal pain: Secondary | ICD-10-CM

## 2018-07-03 DIAGNOSIS — O9989 Other specified diseases and conditions complicating pregnancy, childbirth and the puerperium: Secondary | ICD-10-CM | POA: Diagnosis not present

## 2018-07-03 DIAGNOSIS — D649 Anemia, unspecified: Secondary | ICD-10-CM | POA: Diagnosis not present

## 2018-07-03 DIAGNOSIS — O99012 Anemia complicating pregnancy, second trimester: Secondary | ICD-10-CM | POA: Diagnosis present

## 2018-07-03 DIAGNOSIS — F329 Major depressive disorder, single episode, unspecified: Secondary | ICD-10-CM | POA: Diagnosis present

## 2018-07-03 DIAGNOSIS — Z3A16 16 weeks gestation of pregnancy: Secondary | ICD-10-CM | POA: Diagnosis not present

## 2018-07-03 DIAGNOSIS — Z880 Allergy status to penicillin: Secondary | ICD-10-CM

## 2018-07-03 DIAGNOSIS — L0291 Cutaneous abscess, unspecified: Secondary | ICD-10-CM

## 2018-07-03 DIAGNOSIS — F17210 Nicotine dependence, cigarettes, uncomplicated: Secondary | ICD-10-CM | POA: Diagnosis present

## 2018-07-03 DIAGNOSIS — Z658 Other specified problems related to psychosocial circumstances: Secondary | ICD-10-CM

## 2018-07-03 DIAGNOSIS — O26899 Other specified pregnancy related conditions, unspecified trimester: Secondary | ICD-10-CM | POA: Diagnosis not present

## 2018-07-03 DIAGNOSIS — O99322 Drug use complicating pregnancy, second trimester: Secondary | ICD-10-CM | POA: Diagnosis present

## 2018-07-03 DIAGNOSIS — O99332 Smoking (tobacco) complicating pregnancy, second trimester: Secondary | ICD-10-CM | POA: Diagnosis present

## 2018-07-03 DIAGNOSIS — O99323 Drug use complicating pregnancy, third trimester: Secondary | ICD-10-CM

## 2018-07-03 DIAGNOSIS — D509 Iron deficiency anemia, unspecified: Secondary | ICD-10-CM | POA: Diagnosis present

## 2018-07-03 DIAGNOSIS — O99342 Other mental disorders complicating pregnancy, second trimester: Secondary | ICD-10-CM | POA: Diagnosis present

## 2018-07-03 DIAGNOSIS — D500 Iron deficiency anemia secondary to blood loss (chronic): Secondary | ICD-10-CM

## 2018-07-03 DIAGNOSIS — Z3490 Encounter for supervision of normal pregnancy, unspecified, unspecified trimester: Secondary | ICD-10-CM

## 2018-07-03 DIAGNOSIS — Z720 Tobacco use: Secondary | ICD-10-CM

## 2018-07-03 HISTORY — DX: Major depressive disorder, single episode, severe without psychotic features: F32.2

## 2018-07-03 HISTORY — DX: Hypokalemia: E87.6

## 2018-07-03 HISTORY — DX: Suicide attempt, initial encounter: T14.91XA

## 2018-07-03 HISTORY — DX: Urinary tract infection, site not specified: N39.0

## 2018-07-03 LAB — BASIC METABOLIC PANEL
Anion gap: 6 (ref 5–15)
BUN: 6 mg/dL (ref 6–20)
CHLORIDE: 107 mmol/L (ref 98–111)
CO2: 23 mmol/L (ref 22–32)
CREATININE: 0.54 mg/dL (ref 0.44–1.00)
Calcium: 9.3 mg/dL (ref 8.9–10.3)
GFR calc Af Amer: >60 mL/min (ref >60)
GFR calc non Af Amer: >60 mL/min (ref >60)
GLUCOSE: 100 mg/dL — AB (ref 70–99)
POTASSIUM: 3.7 mmol/L (ref 3.5–5.1)
Sodium: 136 mmol/L (ref 135–145)

## 2018-07-03 LAB — RETICULOCYTES
RBC.: 4.24 MIL/uL (ref 3.87–5.11)
RETIC CT PCT: 0.9 % (ref 0.4–3.1)
Retic Count, Absolute: 38.2 10*3/uL (ref 19.0–186.0)

## 2018-07-03 LAB — RAPID URINE DRUG SCREEN, HOSP PERFORMED
Amphetamines: NOT DETECTED
Benzodiazepines: NOT DETECTED
COCAINE: POSITIVE — AB
Opiates: NOT DETECTED
TETRAHYDROCANNABINOL: NOT DETECTED

## 2018-07-03 LAB — CBC
HCT: 24.5 % — ABNORMAL LOW (ref 36.0–46.0)
HEMOGLOBIN: 6.5 g/dL — AB (ref 12.0–15.0)
MCH: 15.5 pg — AB (ref 26.0–34.0)
MCHC: 26.5 g/dL — AB (ref 30.0–36.0)
MCV: 58.5 fL — AB (ref 78.0–100.0)
PLATELETS: 384 10*3/uL (ref 150–400)
RBC: 4.19 MIL/uL (ref 3.87–5.11)
RDW: 23.4 % — ABNORMAL HIGH (ref 11.5–15.5)
WBC: 6.4 10*3/uL (ref 4.0–10.5)

## 2018-07-03 LAB — IRON AND TIBC
Iron: 12 ug/dL — ABNORMAL LOW (ref 28–170)
Saturation Ratios: 4 % — ABNORMAL LOW (ref 10.4–31.8)
TIBC: 316 ug/dL (ref 250–450)
UIBC: 304 ug/dL

## 2018-07-03 LAB — HCG, QUANTITATIVE, PREGNANCY: hCG, Beta Chain, Quant, S: 35046 m[IU]/mL — ABNORMAL HIGH (ref <5)

## 2018-07-03 LAB — DIRECT ANTIGLOBULIN TEST (NOT AT ARMC)
DAT, IgG: NEGATIVE
DAT, complement: NEGATIVE

## 2018-07-03 LAB — VITAMIN B12: VITAMIN B 12: 381 pg/mL (ref 180–914)

## 2018-07-03 LAB — I-STAT BETA HCG BLOOD, ED (MC, WL, AP ONLY): I-stat hCG, quantitative: >2000 m[IU]/mL — ABNORMAL HIGH (ref <5)

## 2018-07-03 LAB — FERRITIN: FERRITIN: 6 ng/mL — AB (ref 11–307)

## 2018-07-03 LAB — PREPARE RBC (CROSSMATCH)

## 2018-07-03 LAB — FOLATE: Folate: 10.9 ng/mL (ref >5.9)

## 2018-07-03 MED ORDER — AMOXICILLIN-POT CLAVULANATE 875-125 MG PO TABS
1.0000 | ORAL_TABLET | Freq: Once | ORAL | Status: AC
  Administered 2018-07-03: 1 via ORAL
  Filled 2018-07-03: qty 1

## 2018-07-03 MED ORDER — PRENATAL MULTIVITAMIN CH
1.0000 | ORAL_TABLET | Freq: Every day | ORAL | Status: DC
  Administered 2018-07-04: 1 via ORAL
  Filled 2018-07-03 (×3): qty 1

## 2018-07-03 MED ORDER — SODIUM CHLORIDE 0.9% IV SOLUTION
Freq: Once | INTRAVENOUS | Status: AC
  Administered 2018-07-03: 20:00:00 via INTRAVENOUS

## 2018-07-03 MED ORDER — AMOXICILLIN-POT CLAVULANATE 875-125 MG PO TABS
1.0000 | ORAL_TABLET | Freq: Two times a day (BID) | ORAL | Status: DC
Start: 2018-07-04 — End: 2018-07-05
  Administered 2018-07-04 – 2018-07-05 (×3): 1 via ORAL
  Filled 2018-07-03 (×3): qty 1

## 2018-07-03 NOTE — ED Triage Notes (Signed)
Surgical MD at bed side to eval. PT.

## 2018-07-03 NOTE — ED Triage Notes (Signed)
Pt presents to ED for assessment of peri-rectal abscess, which she had Sx on in January.  Patient states she has been noting increasing purulent drainage with blood when she wipes.  Pt c/o pain and itching with bowel movements.  Pt states she is also a few months pregnant, no prenatal care.  Patient states she and her kids have been in between living arrangements for a few months and she has not had time to address her own personal needs.  Pt c/o abdominal pain yesterday, and dark discharge, but denies vaginal bleeding.
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
## 2018-07-03 NOTE — ED Provider Notes (Addendum)
[facility] EMERGENCY DEPARTMENT Provider Note   CSN: [id number] Arrival date & time: 07/03/18  1250     History   Chief Complaint Chief Complaint  Patient presents with  . Abscess    HPI [name] [name] is a 32 y.o. female with a history of perianal abscess status post I&D and depression who presents to the emergency department with a chief complaint of rectal pain and discharge.  The patient reports that she had a perianal abscess drained in the OR in January 2019.  She reports continued purulent and bloody drainage from the area over the last 7 months.  She was supposed to follow-up with surgery in the clinic, but has been dealing with homelessness over the last few months so she was unable to follow-up.    She reports that she also had a positive pregnancy test and has not had a menstrual cycle in the last 4 months.  She has not had any prenatal care or an ultrasound to confirm the pregnancy.  She endorses dark red vaginal discharge that began approximately 3 weeks ago and left-sided abdominal cramping that began yesterday.  Abdominal cramping is intermittent.  No known aggravating or alleviating factors.  She reports that she has been more short of breath with minimal exertion over the last few days with fatigue.  No chest pain or pallor.  She denies fever, chills, nausea, vomiting, melena, hematochezia, headache, weakness, or numbness.  No treatment prior to arrival.  She endorses cocaine use yesterday.  The history is provided by the patient. No language interpreter was used.  Abscess  Associated symptoms: fatigue   Associated symptoms: no fever, no headaches, no nausea and no vomiting     Past Medical History:  Diagnosis Date  . Abscess of axilla   . Anemia   . Anxiety   . Blood transfusion without reported diagnosis   . Depression    bipolar; not on meds  . Headache(784.0)   . Heart murmur   . Hemorrhoids   . Hypokalemia 06/17/2012  . Perianal  abscess 12/2017  . 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  . Substance abuse (HCC)   . Suicide attempt (HCC) 06/19/2012  . UTI (lower urinary tract infection) 06/19/2012    Patient Active Problem List   Diagnosis Date Noted  . Iron deficiency anemia due to chronic blood loss 07/04/2018  . Perianal abscess 07/04/2018  . Cocaine abuse complicating pregnancy in second trimester (HCC) 07/04/2018  . Inadequate social support 07/04/2018  . Abdominal pain in pregnancy   . Pregnant 07/03/2018  . History of Cocaine abuse affecting pregnancy in third trimester (HCC) 04/2016 04/27/2016  . Tobacco abuse 06/17/2012    Past Surgical History:  Procedure Laterality Date  . DENTAL SURGERY    . INCISE AND DRAIN ABCESS  01/16/2018  . INCISION AND DRAINAGE PERIRECTAL ABSCESS N/A 01/16/2018   Procedure: IRRIGATION AND DEBRIDEMENT PERIRECTAL ABSCESS;  Surgeon: Sheliah Hatch, De Blanch, MD;  Location: [facility] OR;  Service: General;  Laterality: N/A;     OB History    Gravida  5   Para  3   Term  2   Preterm  1   AB  1   Living  3     SAB  1   TAB  0   Ectopic  0   Multiple  0   Live Births  3            Home Medications    Prior to Admission  medications   Medication Sig Start Date End Date Taking? Authorizing Provider  acetaminophen (TYLENOL) 500 MG tablet Take 1,000 mg by mouth every 6 (six) hours as needed for mild pain.   Yes [provider]  amoxicillin-clavulanate (AUGMENTIN) 875-125 MG tablet Take 1 tablet by mouth every 12 (twelve) hours. 07/05/18   Peggyann Shoals C, DO  ferrous sulfate 325 (65 FE) MG tablet Take 1 tablet (325 mg total) by mouth daily. 07/05/18 07/05/19  Dollene Cleveland, DO  hydrocortisone (ANUSOL-HC) 2.5 % rectal cream Place rectally 2 (two) times daily. 07/05/18   Dollene Cleveland, DO  Prenatal Vit-Fe Fumarate-FA (PRENATAL MULTIVITAMIN) TABS tablet Take 1 tablet by mouth daily at 12 noon. 07/05/18   Dollene Cleveland, DO    Family History Family  History  Problem Relation Age of Onset  . Hypertension Mother   . Diabetes type II Mother   . Arthritis Mother   . Hyperlipidemia Mother   . Cancer Father        lung    Social History Social History   Tobacco Use  . Smoking status: Former Smoker    Packs/day: 0.50    Years: 9.00    Pack years: 4.50    Types: Cigarettes    Last attempt to quit: 01/14/2018    Years since quitting: 0.5  . Smokeless tobacco: Never Used  Substance Use Topics  . Alcohol use: Yes    Comment: rare  . Drug use: Yes    Frequency: 3.0 times per week    Types: Marijuana, Cocaine     Allergies   Amoxicillin and Penicillins   Review of Systems Review of Systems  Constitutional: Positive for fatigue. Negative for activity change, chills and fever.  Eyes: Negative for visual disturbance.  Respiratory: Positive for shortness of breath.   Cardiovascular: Negative for chest pain.  Gastrointestinal: Negative for abdominal pain, diarrhea, nausea and vomiting.  Genitourinary: Positive for vaginal bleeding. Negative for dysuria.  Musculoskeletal: Negative for back pain.  Skin: Positive for wound. Negative for rash.  Allergic/Immunologic: Negative for immunocompromised state.  Neurological: Negative for weakness, numbness and headaches.  Psychiatric/Behavioral: Negative for confusion.   Physical Exam Updated Vital Signs BP (!) 101/56 (BP Location: Right Arm)   Pulse 69   Temp 98.7 F (37.1 C) (Oral)   Resp 18   Ht 5\' 7"  (1.702 m)   Wt 56.3 kg   LMP 04/10/2018   SpO2 100%   BMI 19.44 kg/m   Physical Exam  Constitutional: No distress.  HENT:  Head: Normocephalic.  Eyes: Conjunctivae are normal.  Neck: Normal range of motion. Neck supple.  Cardiovascular: Normal rate, regular rhythm, normal heart sounds and intact distal pulses. Exam reveals no gallop and no friction rub.  No murmur heard. Pulmonary/Chest: Effort normal and breath sounds normal. No stridor. No respiratory distress. She has  no wheezes. She has no rales. She exhibits no tenderness.  Abdominal: Soft. She exhibits no distension and no mass. There is tenderness. There is no rebound and no guarding. No hernia.  Moderately tender to palpation of the left upper and lower quadrants without rebound or guarding. Gravid abdomen.   Genitourinary:  Genitourinary Comments: Chaperoned exam.  There is a pea-sized area of fluctuance in the 3-4 o'clock area.  No overlying warmth or erythema. Multiple nonthrombosed external hemorrhoids are noted.  Musculoskeletal: She exhibits no edema, tenderness or deformity.  Neurological: She is alert.  Skin: Skin is warm. Capillary refill takes less than 2 seconds.  No rash noted. She is not diaphoretic. No pallor.  Psychiatric: Her behavior is normal.  Nursing note and vitals reviewed.  ED Treatments / Results  Labs (all labs ordered are listed, but only abnormal results are displayed) Labs Reviewed  CBC - Abnormal; Notable for the following components:      Result Value   Hemoglobin 6.5 (*)    HCT 24.5 (*)    MCV 58.5 (*)    MCH 15.5 (*)    MCHC 26.5 (*)    RDW 23.4 (*)    All other components within normal limits  BASIC METABOLIC PANEL - Abnormal; Notable for the following components:   Glucose, Bld 100 (*)    All other components within normal limits  IRON AND TIBC - Abnormal; Notable for the following components:   Iron 12 (*)    Saturation Ratios 4 (*)    All other components within normal limits  FERRITIN - Abnormal; Notable for the following components:   Ferritin 6 (*)    All other components within normal limits  HCG, QUANTITATIVE, PREGNANCY - Abnormal; Notable for the following components:   hCG, Beta Chain, Quant, S 35,046 (*)    All other components within normal limits  RAPID URINE DRUG SCREEN, HOSP PERFORMED - Abnormal; Notable for the following components:   Cocaine POSITIVE (*)    Barbiturates   (*)    Value: Result not available. Reagent lot number recalled by  manufacturer.   All other components within normal limits  BASIC METABOLIC PANEL - Abnormal; Notable for the following components:   Calcium 8.8 (*)    All other components within normal limits  CBC - Abnormal; Notable for the following components:   Hemoglobin 8.2 (*)    HCT 28.2 (*)    MCV 64.1 (*)    MCH 18.6 (*)    MCHC 29.1 (*)    RDW 29.1 (*)    All other components within normal limits  CBC - Abnormal; Notable for the following components:   Hemoglobin 8.6 (*)    HCT 30.4 (*)    MCV 64.7 (*)    MCH 18.3 (*)    MCHC 28.3 (*)    RDW 29.7 (*)    All other components within normal limits  RUBELLA SCREEN - Abnormal; Notable for the following components:   Rubella <0.90 (*)    All other components within normal limits  I-STAT BETA HCG BLOOD, ED ([facility], [facility], [facility] ONLY) - Abnormal; Notable for the following components:   I-stat hCG, quantitative >2,000.0 (*)    All other components within normal limits  CULTURE, OB URINE  VITAMIN B12  FOLATE  RETICULOCYTES  RPR  HIV ANTIBODY (ROUTINE TESTING)  HEPATITIS B SURFACE ANTIGEN  RUBELLA ANTIBODY, IGM  AFP TETRA  TYPE AND SCREEN  ABO/RH  DIRECT ANTIGLOBULIN TEST (NOT AT [facility])  PREPARE RBC (CROSSMATCH)  GC/CHLAMYDIA PROBE AMP ([place]) NOT AT [facility]    EKG None  Radiology No results found.  Procedures .Critical Care Performed by: Barkley Boards, PA-C Authorized by: Barkley Boards, PA-C   Critical care provider statement:    Critical care time (minutes):  35   Critical care time was exclusive of:  Separately billable procedures and treating other patients and teaching time   Critical care was necessary to treat or prevent imminent or life-threatening deterioration of the following conditions:  Circulatory failure   Critical care was time spent personally by me on the following activities:  Discussions  with consultants, evaluation of patient's response to treatment, examination of patient, ordering and performing  treatments and interventions, ordering and review of laboratory studies, ordering and review of radiographic studies, pulse oximetry, re-evaluation of patient's condition, obtaining history from patient or surrogate and review of old charts   (including critical care time)  Medications Ordered in ED Medications  0.9 %  sodium chloride infusion (Manually program via Guardrails IV Fluids) ( Intravenous Given 07/03/18 1958)  amoxicillin-clavulanate (AUGMENTIN) 875-125 MG per tablet 1 tablet (1 tablet Oral Given 07/03/18 2021)  ferumoxytol (FERAHEME) 510 mg in sodium chloride 0.9 % 100 mL IVPB (510 mg Intravenous New Bag/Given 07/05/18 1143)     Initial Impression / Assessment and Plan / ED Course  I have reviewed the triage vital signs and the nursing notes.  Pertinent labs & imaging results that were available during my care of the patient were reviewed by me and considered in my medical decision making (see chart for details).     32 year old female with a history of a perianal abscess status post I&D presenting with discharge from a fluctuant area in the perianal region.  On exam, the patient has a small area of fluctuance noted to the perianal area.  No surrounding cellulitis. The patient reports that her last menstrual cycle was 4 months ago.  She had a positive pregnancy test, but has not established care with an OB/GYN to confirm an intrauterine pregnancy.  She has had vaginal bleeding for the last 3 weeks and began to have abdominal cramping yesterday.  She also endorses cocaine use yesterday.  She is homeless.  The patient was discussed with Dr. Erma Heritage, attending physician.  Labs are notable for hemoglobin of 6.5.  Question if this is from vaginal versus chronic bleeding from the perianal abscess that has been draining for the last 7 months. The patient was typed and screened.  Anemia panel ordered.  She was given 2 units of packed RBCs.  Pelvic ultrasound demonstrating a 16-week and 1 day  intrauterine pregnancy with marginal placenta previa, 1.9 cm to the cervical os.  A palpable 8 x 6 x 8 mm hypoechoic subcutaneous lesion, concerning for a phlegmon is noted in the perianal region.  Given length of purulent discharge and bleeding from the perianal wound, question fistula. Spoke with PA Rayburn who evaluated the patient and recommended initiating Augmentin.  First dose of Augmentin given in the ED.  Spoke with Dr. Sindy Messing with the family medicine team who will accept the patient for admission. The patient appears reasonably stabilized for admission considering the current resources, flow, and capabilities available in the ED at this time, and I doubt any other [facility] requiring further screening and/or treatment in the ED prior to admission.  Final Clinical Impressions(s) / ED Diagnoses   Final diagnoses:  Tobacco abuse  History of Cocaine abuse affecting pregnancy in third trimester (HCC) 04/2016  Anemia, unspecified type  [redacted] weeks gestation of pregnancy  Iron deficiency anemia due to chronic blood loss  Cocaine abuse complicating pregnancy in second trimester (HCC)  Inadequate social support    ED Discharge Orders         Ordered    Prenatal Vit-Fe Fumarate-FA (PRENATAL MULTIVITAMIN) TABS tablet  Daily     07/05/18 1400    hydrocortisone (ANUSOL-HC) 2.5 % rectal cream  2 times daily     07/05/18 1400    amoxicillin-clavulanate (AUGMENTIN) 875-125 MG tablet  Every 12 hours     07/05/18 1400  ferrous sulfate 325 (65 FE) MG tablet  Daily     07/05/18 1404    Discharge patient     07/05/18 1405    Increase activity slowly     07/05/18 1600    Diet - low sodium heart healthy     07/05/18 1600           Natelie Ostrosky A, PA-C 07/03/18 2308    Shaune PollackIsaacs, Cameron, MD 07/04/18 1026    Fiora Weill A, PA-C 08/01/18 1317    Shaune PollackIsaacs, Cameron, MD 08/02/18 1135

## 2018-07-03 NOTE — Consult Note (Signed)
[facility] Consult Note  [name] [name] 06/10/1986  [id number].    Requesting MD: Isaacs, MD Chief Complaint/Reason for Consult: rectal pain  HPI:  32 y/o female with PMH substance abuse, hemorrhoids, anemia, perianal abscess, and axillary abscess who presented to [facility] for evaluation of perirectal abscess. Of note, she states that she is pregnant but has not received any prenatal care. She describes intermittent pain and drainage since original I&D by Dr. Kinsinger 01/16/18, worse with BMs. Denies alleviating factors. Associated symptoms include blood and purulence on toilet paper. Reports a similar symptomology with initial perianal abscess. Denies large volume hematochezia or melena. Patient also reports vaginal discharge and cramping for the past several weeks. States she has moderate to copious amount dark brown discharge usually in the mornings. Patient also reports worsening SOB over the last 3-4 days and bilateral calf pain. Patient reports cocaine use yesterday and codeine use in the last several days. Reports smoking between 5-10 cigarettes daily on average. Denies alcohol use. Patient reports she has been with the same sexual partner for the last 4 years, is unsure if he has ever cheated on her but states they do not use protection. He is currently incarcerated.  NKDA. Denies use of blood thinning medications. Patient has very complex social history, was living in a halfway house situation but that was shut down for insurance fraud, leaving patient and her 3 current children homeless. The children are currently staying with patient's mother, but the patient is essentially homeless.   ED workup: hgb 6.5/hct 24.5/MCV 58.5, No leukocytosis, beta hCG > 2,000   ROS: Review of Systems  Constitutional: Negative for chills and fever.  Eyes: Negative for blurred vision and double vision.  Respiratory: Positive for shortness of breath.   Cardiovascular: Positive for claudication and  leg swelling. Negative for chest pain and palpitations.  Gastrointestinal: Negative for abdominal pain, blood in stool, constipation, diarrhea, melena, nausea and vomiting.  Genitourinary: Negative for dysuria, frequency and urgency.       Rectal pain with purulent and bloody discharge intermittently for several months. Dark brown vaginal discharge and cramping.   Neurological: Positive for weakness.  Psychiatric/Behavioral: Positive for substance abuse.  All other systems reviewed and are negative.   Family History  Problem Relation Age of Onset  . Hypertension Mother   . Diabetes type II Mother   . Arthritis Mother   . Hyperlipidemia Mother   . Cancer Father        lung    Past Medical History:  Diagnosis Date  . Abscess of axilla   . Anemia   . Anxiety   . Blood transfusion without reported diagnosis   . Depression    bipolar; not on meds  . Headache(784.0)   . Heart murmur   . Hemorrhoids   . Perianal abscess 12/2017  . Substance abuse (HCC)     Past Surgical History:  Procedure Laterality Date  . DENTAL SURGERY    . INCISE AND DRAIN ABCESS  01/16/2018  . INCISION AND DRAINAGE PERIRECTAL ABSCESS N/A 01/16/2018   Procedure: IRRIGATION AND DEBRIDEMENT PERIRECTAL ABSCESS;  Surgeon: Kinsinger, Luke Aaron, MD;  Location: [facility] OR;  Service: General;  Laterality: N/A;    Social History:  reports that she quit smoking about 5 months ago. Her smoking use included cigarettes. She has a 4.50 pack-year smoking history. She has never used smokeless tobacco. She reports that she drinks alcohol. She reports that she has current or past drug history. Drugs: Marijuana   and Cocaine. Frequency: 3.00 times per week.  Allergies:  Allergies  Allergen Reactions  . Amoxicillin Nausea And Vomiting    Has patient had a PCN reaction causing immediate rash, facial/tongue/throat swelling, SOB or lightheadedness with hypotension: No Has patient had a PCN reaction causing severe rash involving  mucus membranes or skin necrosis: No Has patient had a PCN reaction that required hospitalization Yes Has patient had a PCN reaction occurring within the last 10 years: Yes If all of the above answers are "NO", then may proceed with Cephalosporin use.  . Penicillins Nausea And Vomiting    Has patient had a PCN reaction causing immediate rash, facial/tongue/throat swelling, SOB or lightheadedness with hypotension: No Has patient had a PCN reaction causing severe rash involving mucus membranes or skin necrosis: No Has patient had a PCN reaction that required hospitalization Yes Has patient had a PCN reaction occurring within the last 10 years: No If all of the above answers are "NO", then may proceed with Cephalosporin use.      (Not in a hospital admission)  Blood pressure 115/78, pulse 84, temperature 98.4 F (36.9 C), temperature source Oral, resp. rate 16, last menstrual period 04/10/2018, SpO2 100 %, unknown if currently breastfeeding. Physical Exam: Physical Exam  Constitutional: She is oriented to person, place, and time. She appears well-developed. She appears cachectic. She is cooperative.  Non-toxic appearance. She has a sickly appearance (chronically malnourished appearance). No distress.  HENT:  Head: Normocephalic and atraumatic.  Right Ear: External ear normal.  Left Ear: External ear normal.  Nose: Nose normal.  Mouth/Throat: Mucous membranes are pale. Abnormal dentition.  Eyes: Conjunctivae and lids are normal. No scleral icterus.  Pupils equal and round  Neck: Normal range of motion and phonation normal. Neck supple. No tracheal deviation present.  Cardiovascular: Normal rate and regular rhythm.  Pulses:      Radial pulses are 2+ on the right side, and 2+ on the left side.       Dorsalis pedis pulses are 2+ on the right side, and 2+ on the left side.  No LE edema. Mild discomfort with dorsiflexion of feet bilaterally. No pain with palpation of calves bilaterally    Pulmonary/Chest: Effort normal and breath sounds normal.  Abdominal: Soft. Bowel sounds are normal. She exhibits no distension. There is no tenderness. There is no rigidity, no rebound and no guarding.  Genitourinary: Rectal exam shows external hemorrhoid and tenderness. Pelvic exam was performed with patient in the knee-chest position.  Genitourinary Comments: Small area that appears to be an opening slightly to the left and ventral to rectum, no erythema, induration, swelling - no drainage expressed, tender to palpation  Musculoskeletal:  ROM grossly intact in bilateral upper and lower extremities  Neurological: She is alert and oriented to person, place, and time. No sensory deficit.  Skin: Skin is warm and dry. She is not diaphoretic.  Psychiatric: Her speech is normal and behavior is normal. Her affect is blunt.    Results for orders placed or performed during the hospital encounter of 07/03/18 (from the past 48 hour(s))  CBC     Status: Abnormal   Collection Time: 07/03/18  1:08 PM  Result Value Ref Range   WBC 6.4 4.0 - 10.5 K/uL   RBC 4.19 3.87 - 5.11 MIL/uL   Hemoglobin 6.5 (LL) 12.0 - 15.0 g/dL    Comment: REPEATED TO VERIFY CRITICAL RESULT CALLED TO, READ BACK BY AND VERIFIED WITH: W.CHILDRESS,RN @ 1340 07/03/2018 [id number]      HCT 24.5 (L) 36.0 - 46.0 %   MCV 58.5 (L) 78.0 - 100.0 fL   MCH 15.5 (L) 26.0 - 34.0 pg   MCHC 26.5 (L) 30.0 - 36.0 g/dL   RDW 23.4 (H) 11.5 - 15.5 %   Platelets 384 150 - 400 K/uL    Comment: Performed at [facility] [address] [place], [place] [address]  Basic metabolic panel     Status: Abnormal   Collection Time: 07/03/18  1:08 PM  Result Value Ref Range   Sodium 136 135 - 145 mmol/L   Potassium 3.7 3.5 - 5.1 mmol/L   Chloride 107 98 - 111 mmol/L    Comment: Please note change in reference range.   CO2 23 22 - 32 mmol/L   Glucose, Bld 100 (H) 70 - 99 mg/dL    Comment: Please note change in reference range.   BUN 6 6 - 20 mg/dL     Comment: Please note change in reference range.   Creatinine, Ser 0.54 0.44 - 1.00 mg/dL   Calcium 9.3 8.9 - 10.3 mg/dL   GFR calc non Af Amer >60 >60 mL/min   GFR calc Af Amer >60 >60 mL/min    Comment: (NOTE) The eGFR has been calculated using the CKD EPI equation. This calculation has not been validated in all clinical situations. eGFR's persistently <60 mL/min signify possible Chronic Kidney Disease.    Anion gap 6 5 - 15    Comment: Performed at [facility] [address] [place], [place] [address]  I-Stat Beta hCG blood, ED ([facility], [facility], [facility] only)     Status: Abnormal   Collection Time: 07/03/18  1:17 PM  Result Value Ref Range   I-stat hCG, quantitative >2,000.0 (H) <5 mIU/mL   Comment 3            Comment:   GEST. AGE      CONC.  (mIU/mL)   <=1 WEEK        5 - 50     2 WEEKS       50 - 500     3 WEEKS       100 - 10,000     4 WEEKS     1,000 - 30,000        FEMALE AND NON-PREGNANT FEMALE:     LESS THAN 5 mIU/mL    No results found.  Assessment/Plan Pregnancy of undetermined date - recommend OB consult, patient may need STI screening, HIV was non-reactive 01/07/18 Microcytic anemia of undetermined etiology - H/H 6.5/24.5 Shortness of breath - could be related to anemia, some concern for DVT, recommend doppler studies to r/o DVT External hemorrhoids - no thrombosed hemorrhoids seen Hx of substance abuse - recommend CSW consult Anxiety/Depression  Perirectal abscess  - potentially recurrent vs never really healed after initial I&D - given patient's multitude of other medical issues currently would recommend IV abx and sitz baths until anemia improved and pregnancy status sorted out - could attempt to visualize area to see if there is a drainable collection either with transvaginal US or MRI - from a general surgery standpoint, it is fine for the patient to eat  Brigid Re, [facility] Surgery 07/03/2018, 4:19 PM Pager: [phone] Consults:  [phone] Mon-Fri 7:00 am-4:30 pm Sat-Sun 7:00 am-11:30 am

## 2018-07-04 ENCOUNTER — Encounter (HOSPITAL_COMMUNITY): Payer: Self-pay | Admitting: Family Medicine

## 2018-07-04 ENCOUNTER — Other Ambulatory Visit: Payer: Self-pay

## 2018-07-04 DIAGNOSIS — O99322 Drug use complicating pregnancy, second trimester: Secondary | ICD-10-CM

## 2018-07-04 DIAGNOSIS — K610 Anal abscess: Secondary | ICD-10-CM | POA: Diagnosis present

## 2018-07-04 DIAGNOSIS — Z658 Other specified problems related to psychosocial circumstances: Secondary | ICD-10-CM

## 2018-07-04 DIAGNOSIS — R109 Unspecified abdominal pain: Secondary | ICD-10-CM

## 2018-07-04 DIAGNOSIS — F1410 Cocaine abuse, uncomplicated: Secondary | ICD-10-CM | POA: Diagnosis present

## 2018-07-04 DIAGNOSIS — O26899 Other specified pregnancy related conditions, unspecified trimester: Secondary | ICD-10-CM

## 2018-07-04 DIAGNOSIS — D500 Iron deficiency anemia secondary to blood loss (chronic): Secondary | ICD-10-CM | POA: Diagnosis present

## 2018-07-04 LAB — BASIC METABOLIC PANEL
Anion gap: 5 (ref 5–15)
BUN: 7 mg/dL (ref 6–20)
CHLORIDE: 110 mmol/L (ref 98–111)
CO2: 23 mmol/L (ref 22–32)
CREATININE: 0.48 mg/dL (ref 0.44–1.00)
Calcium: 8.8 mg/dL — ABNORMAL LOW (ref 8.9–10.3)
Glucose, Bld: 91 mg/dL (ref 70–99)
Potassium: 3.9 mmol/L (ref 3.5–5.1)
SODIUM: 138 mmol/L (ref 135–145)

## 2018-07-04 LAB — RPR: RPR Ser Ql: NONREACTIVE

## 2018-07-04 LAB — CBC
HCT: 28.2 % — ABNORMAL LOW (ref 36.0–46.0)
Hemoglobin: 8.2 g/dL — ABNORMAL LOW (ref 12.0–15.0)
MCH: 18.6 pg — ABNORMAL LOW (ref 26.0–34.0)
MCHC: 29.1 g/dL — ABNORMAL LOW (ref 30.0–36.0)
MCV: 64.1 fL — AB (ref 78.0–100.0)
PLATELETS: 344 10*3/uL (ref 150–400)
RBC: 4.4 MIL/uL (ref 3.87–5.11)
RDW: 29.1 % — AB (ref 11.5–15.5)
WBC: 6.8 10*3/uL (ref 4.0–10.5)

## 2018-07-04 LAB — HIV ANTIBODY (ROUTINE TESTING W REFLEX): HIV SCREEN 4TH GENERATION: NONREACTIVE

## 2018-07-04 LAB — ABO/RH: ABO/RH(D): O POS

## 2018-07-04 MED ORDER — FERROUS SULFATE 325 (65 FE) MG PO TABS
325.0000 mg | ORAL_TABLET | Freq: Two times a day (BID) | ORAL | Status: DC
  Administered 2018-07-04: 325 mg via ORAL
  Filled 2018-07-04: qty 1

## 2018-07-04 MED ORDER — PHENYLEPH-SHARK LIV OIL-MO-PET 0.25-3-14-71.9 % RE OINT
1.0000 "application " | TOPICAL_OINTMENT | Freq: Two times a day (BID) | RECTAL | Status: DC
  Filled 2018-07-04: qty 28.4

## 2018-07-04 MED ORDER — HYDROCORTISONE 2.5 % RE CREA
TOPICAL_CREAM | Freq: Two times a day (BID) | RECTAL | Status: DC
  Administered 2018-07-04: 1 via RECTAL
  Administered 2018-07-04: 23:00:00 via RECTAL
  Administered 2018-07-05: 1 via RECTAL
  Filled 2018-07-04: qty 28.35

## 2018-07-04 NOTE — Progress Notes (Addendum)
Family Medicine Teaching Service Daily Progress Note Intern Pager: [phone]  Patient name: [name] [name] Medical record number: [id number] Date of birth: [birth year] Age: 32 y.o. Gender: female  Primary Care Provider: Patient, No Pcp Per Consultants:General Surgery Code Status:FULL  Chief Complaint:abscess  Assessment and Plan: Jonisha C McCainis a 32 y.o.patient presenting with abscess and anemia. PMH is significant foranemia, tobacco use, h/o polysubstance abuse, and depression.  Perianal Abscess. Since January 2019, 8 mm phlegmonous region in the perianal region.  - Admit to med-surg, attending Dr. McDiarmid - Monitor vitals - Out of bed with assistance - Surgery - sitz baths, PO augmentin, no I&D at this time, can f/u with colorectal surgeon as needed; appreciate recommendations! - Consider GI consultationasoutpatient  CurrentlyPregnant:[id number],beta HCG >2,000, intrauterine pregnancy around 16wks,LMP reported as March 23.Fetal Heart Rate 162 bpmin ED.  - UDS + Cocaine - Obtain gonorrhea and chlamydia culture - F/uObstetric care outpatient - Start prenatal vitamin with iron supplementation - Scheduled outpatient OB follow up scheduled for August 1st, 9:55am at [facility]  Homelessness: Complex social situation. Patient was staying in a halfway house with her three children that was shut down for insurance fraud. The patient's children are staying with her mother, but the patient is homeless. Her current partner is incarcerated. Has issues with food safety. - Social Work following  Symptomatic Anemia:Hgb 6.5 in ED; SOB over last 3-4 days, blood with wiping, no melena - Post-transfusion H&H 8.6/30.4 s/p 2U PRBCs - Repeat CBC in AM - Feraheme IV x 1 (7/19)  Shortness of breath: likely 2/2 anemia. Wells score 0, PE unlikely. Patient currently pregnant andusing tobacco. No signs of DVT on exam.  - Monitor for improvement after pRBC's  Tobacco  Use: Currently pregnant.About 5 cigarettes/day. - Nicotine patch prn - Counsel on cessation while pregnant  Dental infection:Seen in ED on June 26 for dental pain. Was started on clindamycin for infection.Patient notes that she has been without her clindamycin as it was stolen.  - Continue Augmentin - Continue to monitor for pain - F/u dentist as outpatient  Substance abuse:Patient notes use of cocaine 7/17.She denies other recent drug use. She states that she wants to remove herself from situations where she is exposed to drugs. - Obtain UDS - Counseled on cessation of drug use while pregnant  FEN/GI:Regular Prophylaxis:SCD's  Disposition:Admit to med-surg  Subjective:  Patient seen this AM resting in bed in NAD. Her daughter is in the room. She reports feeling tired and had some abdominal pain last night. She had one small bowel movement yesterday and felt lightheaded walking around. She still feels tenderness to her bottom but says she feel the abscess drained a little yesterday and that it feels smaller. She denies headaches, chest pain, dyspnea, fatigue, nausea, vomiting, constipation and diarrhea. She says she would like to try a [employer] today.   Objective: Vitals:   07/04/18 2058 07/05/18 0504  BP: (!) 94/58 (!) 93/54  Pulse: 68 62  Resp: 16 16  Temp: 98.7 F (37.1 C) 98.4 F (36.9 C)  SpO2: 100% 100%   Physical Exam: Constitutional:No distress.  HENT:  Head:Normocephalicand atraumatic.  Mouth/Throat:Oropharynx is clear and moist.  Eyes:EOMI Cardiovascular:Normal rateand regular rhythm. Exam revealsno gallopand no friction rub. Pulmonary/Chest:Effort normaland breath sounds normal. Norespiratory distress. She hasno wheezes. She hasno rales.  Abdominal:Soft.Bowel sounds are normal. There isno tenderness to palpation. Musculoskeletal: She exhibits noedema.  GI: 2-3 external hemorrhoids appreciated; closed ~2cm firm, fixed subcutaneous  left perianal mass without drainage  GU: vaginal examination shows no discharge or blood Skin: Skin iswarmand dry. She isnot diaphoretic.  Laboratory: CBC Latest Ref Rng & Units 07/05/2018 07/04/2018 07/03/2018  WBC 4.0 - 10.5 K/uL 7.9 6.8 6.4  Hemoglobin 12.0 - 15.0 g/dL 1.6(X8.6(L) 0.9(U8.2(L) 6.5(LL)  Hematocrit 36.0 - 46.0 % 30.4(L) 28.2(L) 24.5(L)  Platelets 150 - 400 K/uL 353 344 384   CMP Latest Ref Rng & Units 07/04/2018 07/03/2018 01/16/2018  Glucose 70 - 99 mg/dL 91 045(W100(H) 86  BUN 6 - 20 mg/dL 7 6 9   Creatinine 0.44 - 1.00 mg/dL 0.980.48 1.190.54 1.470.60  Sodium 135 - 145 mmol/L 138 136 140  Potassium 3.5 - 5.1 mmol/L 3.9 3.7 3.9  Chloride 98 - 111 mmol/L 110 107 111  CO2 22 - 32 mmol/L 23 23 22   Calcium 8.9 - 10.3 mg/dL 8.2(N8.8(L) 9.3 8.9  Total Protein 6.5 - 8.1 g/dL - - -  Total Bilirubin 0.3 - 1.2 mg/dL - - -  Alkaline Phos 38 - 126 U/L - - -  AST 15 - 41 U/L - - -  ALT 14 - 54 U/L - - -   Rubella IgM (7/18): negative Rubella Screen (7/19): HIV (7/18): non reactive nml RPR (7/18): non-reactive nml HbSAg (7/18): negative GC (7/18):  Chlamydia (7/18):  UDS (7/18): + Cocaine Blood Type: O+ HCG (7/17): 35,046  Imaging/Diagnostic Tests: US Ob Limited: 07/03/2018 CLINICAL DATA:  Pregnant, abdominal pain x1 day EXAM: LIMITED OBSTETRIC ULTRASOUND FINDINGS: Number of Fetuses: 1 Heart Rate:  162 bpm Movement: Yes Presentation: Transverse Placental Location: Posterior Previa: Marginal, 1.9 cm to cervical os Amniotic Fluid (Subjective):  Within normal limits. BPD: 3.24 cm 16 w  1 d MATERNAL FINDINGS: Cervix:  Appears closed. Uterus/Adnexae: No abnormality visualized.  IMPRESSION: Single live intrauterine gestation, as described above. Fetal heart rate 162 beats per minute. Marginal placenta previa, 1.9 cm to cervical os. Attention at the time of dedicated fetal anatomic survey is suggested. This exam is performed on an emergent basis and does not comprehensively evaluate fetal size, dating, or anatomy;  follow-up complete OB US should be considered if further fetal assessment is warranted.   US Pelvis Limited (transabdominal Only): 07/03/2018 Palpable abnormality corresponds to a 8 x 6 x 8 mm hypoechoic subcutaneous lesion, presumably a small phlegmonous area/non drainable fluid collection.  IMPRESSION: 8 mm phlegmonous region, corresponding to the subcutaneous palpable abnormality in the perineal region, at the area of clinical concern.  Peggyann ShoalsHannah Anderson, DO [facility], PGY-1 07/05/2018 8:07 AM FPTS Intern pager: [phone], text pages welcome

## 2018-07-04 NOTE — Final Consult Note (Signed)
[facility] Progress Note     Subjective: CC: rectal pain Patient reports rectum is tender because everyone has been looking at it and pushing on opening. She reports some fear with BMs because she usually gets rectal pain afterwards and has to push in hemorrhoid. Tolerating diet. SOB remains the same.   Objective: Vital signs in last 24 hours: Temp:  [97.7 F (36.5 C)-98.9 F (37.2 C)] 97.7 F (36.5 C) (07/18 0540) Pulse Rate:  [62-84] 65 (07/18 0540) Resp:  [16-20] 20 (07/18 0540) BP: (94-117)/(45-97) 99/61 (07/18 0540) SpO2:  [94 %-100 %] 100 % (07/18 0540) Weight:  [124 lb 1.9 oz (56.3 kg)] 124 lb 1.9 oz (56.3 kg) (07/18 0540) Last BM Date: 07/02/18  Intake/Output from previous day: 07/17 0701 - 07/18 0700 In: 990 [P.O.:360; Blood:630] Out: -  Intake/Output this shift: No intake/output data recorded.  PE: Gen:  Alert, NAD, pleasant Card:  Regular rate and rhythm, pedal pulses 2+ BL Pulm:  Slightly labored with talking, clear to auscultation bilaterally, Rembrandt present Abd: Soft, non-tender, non-distended, bowel sounds present GU: non-thrombosed external hemorrhoid, small opening with scant thin drainage expressed, no erythema or induration  Skin: warm and dry, no rashes  Psych: A&Ox3   Lab Results:  Recent Labs    07/03/18 1308 07/04/18 0521  WBC 6.4 6.8  HGB 6.5* 8.2*  HCT 24.5* 28.2*  PLT 384 344   BMET Recent Labs    07/03/18 1308 07/04/18 0521  NA 136 138  K 3.7 3.9  CL 107 110  CO2 23 23  GLUCOSE 100* 91  BUN 6 7  CREATININE 0.54 0.48  CALCIUM 9.3 8.8*   PT/INR No results for input(s): LABPROT, INR in the last 72 hours. CMP     Component Value Date/Time   NA 138 07/04/2018 0521   K 3.9 07/04/2018 0521   CL 110 07/04/2018 0521   CO2 23 07/04/2018 0521   GLUCOSE 91 07/04/2018 0521   BUN 7 07/04/2018 0521   CREATININE 0.48 07/04/2018 0521   CALCIUM 8.8 (L) 07/04/2018 0521   PROT 5.6 (L) 04/27/2016 0709   ALBUMIN 2.5 (L)  04/27/2016 0709   AST 16 04/27/2016 0709   ALT 14 04/27/2016 0709   ALKPHOS 180 (H) 04/27/2016 0709   BILITOT 0.3 04/27/2016 0709   GFRNONAA >60 07/04/2018 0521   GFRAA >60 07/04/2018 0521   Lipase     Component Value Date/Time   LIPASE 13 (L) 06/11/2015 1036       Studies/Results: US Ob Limited  Result Date: 07/03/2018 CLINICAL DATA:  Pregnant, abdominal pain x1 day EXAM: LIMITED OBSTETRIC ULTRASOUND FINDINGS: Number of Fetuses: 1 Heart Rate:  162 bpm Movement: Yes Presentation: Transverse Placental Location: Posterior Previa: Marginal, 1.9 cm to cervical os Amniotic Fluid (Subjective):  Within normal limits. BPD: 3.24 cm 16 w  1 d MATERNAL FINDINGS: Cervix:  Appears closed. Uterus/Adnexae: No abnormality visualized. IMPRESSION: Single live intrauterine gestation, as described above. Fetal heart rate 162 beats per minute. Marginal placenta previa, 1.9 cm to cervical os. Attention at the time of dedicated fetal anatomic survey is suggested. This exam is performed on an emergent basis and does not comprehensively evaluate fetal size, dating, or anatomy; follow-up complete OB US should be considered if further fetal assessment is warranted. Electronically Signed   By: Charline Bills M.D.   On: 07/03/2018 19:23   US Pelvis Limited (transabdominal Only)  Result Date: 07/03/2018 CLINICAL DATA:  Peritoneal drainage x1 day, history of perineal abscess  drained in January EXAM: LIMITED ULTRASOUND OF PELVIS TECHNIQUE: Limited transabdominal ultrasound examination of the pelvis was performed. COMPARISON:  None. FINDINGS: Targeted ultrasound was performed in the area of clinical concern (perineal region). Palpable abnormality corresponds to a 8 x 6 x 8 mm hypoechoic subcutaneous lesion, presumably a small phlegmonous area/non drainable fluid collection. IMPRESSION: 8 mm phlegmonous region, corresponding to the subcutaneous palpable abnormality in the perineal region, at the area of clinical concern.  Electronically Signed   By: Charline BillsSriyesh  Krishnan M.D.   On: 07/03/2018 19:26    Anti-infectives: Anti-infectives (From admission, onward)   Start     Dose/Rate Route Frequency Ordered Stop   07/04/18 1000  amoxicillin-clavulanate (AUGMENTIN) 875-125 MG per tablet 1 tablet     1 tablet Oral Every 12 hours 07/03/18 2321     07/03/18 1800  amoxicillin-clavulanate (AUGMENTIN) 875-125 MG per tablet 1 tablet     1 tablet Oral  Once 07/03/18 1747 07/03/18 2021       Assessment/Plan [redacted] wks gestation - recommend OB consult, STI screening in progress Microcytic anemia of undetermined etiology - H/H 6.5/24.5 on admission, s/p 2 units PRBC, H/H 8.2/28.2 today  Shortness of breath - believed to be from anemia External hemorrhoids - no thrombosed hemorrhoids seen, recommend preparation H cream prn Hx of substance abuse - recommend CSW consult, UDS positive for cocaine Anxiety/Depression  Recurrent perirectal abscess, 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    LOS: 1 day     Wells GuilesKelly Rayburn , [facility] 07/04/2018, 9:42 AM Pager: [phone] Consults: [phone] Mon-Fri 7:00 am-4:30 pm Sat-Sun 7:00 am-11:30 am

## 2018-07-04 NOTE — Progress Notes (Addendum)
Family Medicine Teaching Service Daily Progress Note Intern Pager: [phone]  Patient name: [name] [name] Medical record number: [id number] Date of birth: 10/06/1986 Age: 32 y.o. Gender: female  Primary Care Provider: Patient, No Pcp Per Consultants: General Surgery Code Status: FULL  Chief Complaint: abscess   Assessment and Plan: [name] [name] is a 32 y.o. patient presenting with abscess and anemia. PMH is significant for anemia, tobacco use, h/o polysubstance abuse, and depression.  Perianal Abscess. Since January 2019, 8 mm phlegmonous region in the perianal region.  -Admit to med-surg, attending Dr. McDiarmid -Monitor vitals -Out of bed with assistance -Start Augmentin per surgery recommendations -Warm sitz bath as outpatient -Surgery will continue to follow during hospitalization- does not feel this is fistula but recurrence -Consider GI consultation as outpatient  Currently Pregnant: [id number], beta HCG >2,000, No prenatal care to this point. Has noted dark red vaginal bleeding the last few weeks and cramping yesterday. TVUS today confirmed intrauterine pregnancy around 16wks, LMP reported as March 23. Fetal Heart Rate 162 bpm in ED.  - UDS + Cocaine - Obtain RPR, HIV, gonorrhea and chlamydia culture (will need to obtain swab while admitted), hepatitis B antigen - F/u Obstetric care outpatient - Start prenatal vitamin with iron supplementation  Homelessness: Complex social situation.  Patient was staying in a halfway house with her three children that was shut down for insurance fraud.  The patient's children are staying with her mother, but the patient is homeless.  Her current partner is incarcerated.  Has issues with food safety.  -Consult Social Work   Symptomatic Anemia: Hgb 6.5 in ED; SOB over last 304 days, blood with wiping, no melena - Post-transfusion H&H  > 8.5 s/p 2U PRBCs - Repeat CBC in AM  Shortness of breath: likely 2/2 anemia. Wells score 0, PE  unlikely. Patient currently pregnant and using tobacco. No signs of DVT on exam.  - Monitor for improvement after pRBC's  Tobacco Use: Currently pregnant. About 5 cigarettes/day. - Nicotine patch prn - Counsel on cessation while pregnant  Dental infection: Seen in ED on June 26 for dental pain. Was started on clindamycin for infection.  Patient notes that she has been without her clindamycin as it was stolen.  - Continue Augmentin  - continue to monitor for pain - f/u dentist as outpatient  Substance abuse: Patient notes use of cocaine yesterday.  She denies other recent drug use.  She states that she wants to remove herself from situations where she is exposed to drugs. -Obtain UDS -Counseled on cessation of drug use while pregnant  FEN/GI: Regular Prophylaxis: SCD's  Disposition: Admit to med-surg  Subjective:  Patient seen this AM resting in bed in NAD. She reports feeling tired but says she slept well. She denies headaches, chest pain, dyspnea, abdominal pain, fatigue, nausea, vomiting, constipation or diarrhea.  Objective: Temp:  [97.7 F (36.5 C)-98.9 F (37.2 C)] 97.7 F (36.5 C) (07/18 0540) Pulse Rate:  [62-84] 65 (07/18 0540) Resp:  [16-20] 20 (07/18 0540) BP: (94-117)/(45-97) 99/61 (07/18 0540) SpO2:  [94 %-100 %] 100 % (07/18 0540) Weight:  [124 lb 1.9 oz (56.3 kg)] 124 lb 1.9 oz (56.3 kg) (07/18 0540)  Physical Exam: Constitutional: No distress.  HENT:  Head: Normocephalic and atraumatic.  Mouth/Throat: Oropharynx is clear and moist.  Eyes: EOMI Cardiovascular: Normal rate and regular rhythm. Exam reveals no gallop and no friction rub.  Murmur (heard while patient holding breath, 1/6 systolic) heard. Pulmonary/Chest: Effort normal and breath sounds  normal. No respiratory distress. She has no wheezes. She has no rales.  Abdominal: Soft. Bowel sounds are normal. There is no tenderness.  Musculoskeletal: She exhibits no edema.  GI: 2-3 external hemorrhoids  appreciated; closed ~2cm firm, fixed subcutaneous left perianal mass without drainage GU: vaginal examination shows no discharge or blood Skin: Skin is warm and dry. She is not diaphoretic.   Laboratory: Recent Labs  Lab 07/03/18 1308 07/04/18 0521  WBC 6.4 6.8  HGB 6.5* 8.2*  HCT 24.5* 28.2*  PLT 384 344   Recent Labs  Lab 07/03/18 1308 07/04/18 0521  NA 136 138  K 3.7 3.9  CL 107 110  CO2 23 23  BUN 6 7  CREATININE 0.54 0.48  CALCIUM 9.3 8.8*  GLUCOSE 100* 91   HIV (7/18): non reactive nml RPR (7/18): non-reactive nml Hep B (7/18): GC (7/18): Chlamydia (7/18):  UDS (7/18): + Cocaine Blood Type: O+ HCG (7/17): 35,046  Imaging/Diagnostic Tests: US Ob Limited: 07/03/2018 CLINICAL DATA:  Pregnant, abdominal pain x1 day EXAM: LIMITED OBSTETRIC ULTRASOUND FINDINGS: Number of Fetuses: 1 Heart Rate:  162 bpm Movement: Yes Presentation: Transverse Placental Location: Posterior Previa: Marginal, 1.9 cm to cervical os Amniotic Fluid (Subjective):  Within normal limits. BPD: 3.24 cm 16 w  1 d MATERNAL FINDINGS: Cervix:  Appears closed. Uterus/Adnexae: No abnormality visualized.  IMPRESSION: Single live intrauterine gestation, as described above. Fetal heart rate 162 beats per minute. Marginal placenta previa, 1.9 cm to cervical os. Attention at the time of dedicated fetal anatomic survey is suggested. This exam is performed on an emergent basis and does not comprehensively evaluate fetal size, dating, or anatomy; follow-up complete OB US should be considered if further fetal assessment is warranted.   US Pelvis Limited (transabdominal Only): 07/03/2018 Palpable abnormality corresponds to a 8 x 6 x 8 mm hypoechoic subcutaneous lesion, presumably a small phlegmonous area/non drainable fluid collection.  IMPRESSION: 8 mm phlegmonous region, corresponding to the subcutaneous palpable abnormality in the perineal region, at the area of clinical concern.  Dollene ClevelandAnderson, Cleave Ternes C, DO 07/04/2018,  7:54 AM PGY-1, [facility] FPTS Intern pager: [phone], text pages welcome

## 2018-07-04 NOTE — Discharge Instructions (Signed)
How to Take a Sitz Bath °A sitz bath is a warm water bath that is taken while you are sitting down. The water should only come up to your hips and should cover your buttocks. Your health care provider may recommend a sitz bath to help you: °· Clean the lower part of your body, including your genital area. °· With itching. °· With pain. °· With sore muscles or muscles that tighten or spasm. ° °How to take a sitz bath °Take 3-4 sitz baths per day or as told by your health care provider. °1. Partially fill a bathtub with warm water. You will only need the water to be deep enough to cover your hips and buttocks when you are sitting in it. °2. If your health care provider told you to put medicine in the water, follow the directions exactly. °3. Sit in the water and open the tub drain a little. °4. Turn on the warm water again to keep the tub at the correct level. Keep the water running constantly. °5. Soak in the water for 15-20 minutes or as told by your health care provider. °6. After the sitz bath, pat the affected area dry first. Do not rub it. °7. Be careful when you stand up after the sitz bath because you may feel dizzy. ° °Contact a health care provider if: °· Your symptoms get worse. Do not continue with sitz baths if your symptoms get worse. °· You have new symptoms. Do not continue with sitz baths until you talk with your health care provider. °This information is not intended to replace advice given to you by your health care provider. Make sure you discuss any questions you have with your health care provider. °Document Released: 08/26/2004 Document Revised: 05/03/2016 Document Reviewed: 12/02/2014 °Elsevier Interactive Patient Education © 2018 [employer] ° °

## 2018-07-04 NOTE — Clinical Social Work Note (Signed)
Clinical Social Work Assessment  Patient Details  Name: [name] [name] MRN: [id number] Date of Birth: December 26, 1985  Date of referral:  07/04/18               Reason for consult:  Substance Use/ETOH Abuse, Housing Concerns/Homelessness                Permission sought to share information with:  Facility Sport and exercise psychologist, Family Supports Permission granted to share information::  Yes, Verbal Permission Granted  Name::     Maralyn Witherell  Agency::  family shelters  Relationship::  mother  Contact Information:  [phone]  Housing/Transportation Living arrangements for the past 2 months:  Apartment, Hotel/Motel Source of Information:  Patient Patient Interpreter Needed:  None Criminal Activity/Legal Involvement Pertinent to Current Situation/Hospitalization:  No - Comment as needed Significant Relationships:  Parents, Dependent Children Lives with:  Minor Children Do you feel safe going back to the place where you live?  No Need for family participation in patient care:  No (Coment)  Care giving concerns: Patient unstably housed, was living in apartment through [employer] PTA but is unable to return. Patient pregnant and has three children. Patient positive for cocaine on admission.   Social Worker assessment / plan: CSW met with patient at bedside. Patient's mother and patient's children also at bedside visiting. CSW introduced self and role. Patient alert and oriented and with pleasant demeanor. Patient described her prior living situation, explaining that she and the children were living in an apartment through [employer], but there were also several other people in the apartment and the living conditions were poor. Patient's children are currently staying with patient's mother and can remain there temporarily.  Patient indicated she sought assistance from social services/CPS stating, "I basically made a report on myself." Patient stated she has not had any follow up from a  CPS caseworker yet.  CSW referred patient to family shelters in the area - Room at the [employer] [facility], [employer], and [facility]. Advised patient to call [facility] for phone intake. [facility] and [facility] likely have waiting lists, patient aware of this and planning to call for bed availability and application process.   Assessment for substance use not appropriate at the time of initial assessment, as patient's children were present. CSW to follow and support and can offer resources for substance use treatment.    Employment status:    Insurance information:  Medicaid [place] PT Recommendations:  Not assessed at this time Information / Referral to community resources:  Residential Substance Abuse Treatment Options, Outpatient Substance Abuse Treatment Options, Shelter  Patient/Family's Response to care: Patient appreciative of care.  Patient/Family's Understanding of and Emotional Response to Diagnosis, Current Treatment, and Prognosis: Patient with good understanding of condition, though does still have some questions. Did discuss her symptoms PTA and stated she hadn't realized how serious it was until medical team explained.  Emotional Assessment Appearance:  Appears stated age Attitude/Demeanor/Rapport:  Engaged Affect (typically observed):  Accepting, Pleasant, Calm Orientation:  Oriented to Self, Oriented to Place, Oriented to  Time, Oriented to Situation Alcohol / Substance use:  Illicit Drugs, Tobacco Use Psych involvement (Current and /or in the community):  No (Comment)  Discharge Needs  Concerns to be addressed:  Care Coordination, Substance Abuse Concerns, Homelessness, Home Safety Concerns Readmission within the last 30 days:  No Current discharge risk:  Substance Abuse, Homeless Barriers to Discharge:  Continued Medical Work up   Estanislado Emms, LCSW  07/04/2018, 4:00 PM

## 2018-07-05 LAB — TYPE AND SCREEN
ABO/RH(D): O POS
Antibody Screen: NEGATIVE
Unit division: 0
Unit division: 0

## 2018-07-05 LAB — CULTURE, OB URINE: Culture: NO GROWTH

## 2018-07-05 LAB — CBC
HEMATOCRIT: 30.4 % — AB (ref 36.0–46.0)
Hemoglobin: 8.6 g/dL — ABNORMAL LOW (ref 12.0–15.0)
MCH: 18.3 pg — ABNORMAL LOW (ref 26.0–34.0)
MCHC: 28.3 g/dL — ABNORMAL LOW (ref 30.0–36.0)
MCV: 64.7 fL — AB (ref 78.0–100.0)
Platelets: 353 10*3/uL (ref 150–400)
RBC: 4.7 MIL/uL (ref 3.87–5.11)
RDW: 29.7 % — ABNORMAL HIGH (ref 11.5–15.5)
WBC: 7.9 10*3/uL (ref 4.0–10.5)

## 2018-07-05 LAB — HEPATITIS B SURFACE ANTIGEN: Hepatitis B Surface Ag: NEGATIVE

## 2018-07-05 LAB — BPAM RBC
BLOOD PRODUCT EXPIRATION DATE: 201908112359
Blood Product Expiration Date: 201908112359
ISSUE DATE / TIME: 201907171939
ISSUE DATE / TIME: 201907180023
UNIT TYPE AND RH: 5100
Unit Type and Rh: 5100

## 2018-07-05 LAB — RUBELLA ANTIBODY, IGM

## 2018-07-05 MED ORDER — FERROUS SULFATE 325 (65 FE) MG PO TABS: 325.0000 mg | ORAL_TABLET | Freq: Every day | ORAL | 3 refills | Status: DC

## 2018-07-05 MED ORDER — PRENATAL MULTIVITAMIN CH: 1.0000 | ORAL_TABLET | Freq: Every day | ORAL | 2 refills | Status: DC

## 2018-07-05 MED ORDER — HYDROCORTISONE 2.5 % RE CREA: TOPICAL_CREAM | Freq: Two times a day (BID) | RECTAL | 0 refills | Status: DC

## 2018-07-05 MED ORDER — SODIUM CHLORIDE 0.9 % IV SOLN
510.0000 mg | Freq: Once | INTRAVENOUS | Status: AC
  Administered 2018-07-05: 510 mg via INTRAVENOUS
  Filled 2018-07-05 (×2): qty 17

## 2018-07-05 MED ORDER — AMOXICILLIN-POT CLAVULANATE 875-125 MG PO TABS: 1.0000 | ORAL_TABLET | Freq: Two times a day (BID) | ORAL | 0 refills | Status: DC

## 2018-07-05 NOTE — Discharge Summary (Signed)
Family Medicine Teaching [facility] Discharge Summary  Patient name: [name] [name] Medical record number: [id number] Date of birth: 11/20/1986 Age: 32 y.o. Gender: female Date of Admission: 07/03/2018  Date of Discharge: 07/05/2018 Admitting Physician: Leighton Roachodd D McDiarmid, MD  Primary Care Provider: Patient, No Pcp Per Consultants: OB, Surg  Indication for Hospitalization: perianal abscess  Discharge Diagnoses/Problem List:  Perianal Abscess Pregnancy Anemia Shortness of Breath Substance Abuse Poor dentition  Disposition: Discharge to temporary housing  Discharge Condition: Stable  Discharge Exam:  Constitutional:No distress.  HENT:  Head:Normocephalicand atraumatic.  Mouth/Throat:Oropharynx is clear and moist.  Eyes:EOMI Cardiovascular:Normal rateand regular rhythm. Exam revealsno gallopand no friction rub. Pulmonary/Chest:Effort normaland breath sounds normal. Norespiratory distress. She hasno wheezes. She hasno rales.  Abdominal:Soft.Bowel sounds are normal. There isno tenderness to palpation. Musculoskeletal: She exhibits noedema. GI: 2-3 external hemorrhoids appreciated; closed ~2cm firm, fixed subcutaneous left perianal mass without drainage GU: vaginal examination shows no discharge or blood Skin: Skin iswarmand dry. She isnot diaphoretic.  Brief Hospital Course:  [name] [name] is a 32 year old [id number] female with a complicated social situation who was admitted for a perianal abscess and pregnancy at 16 weeks. General surgery was consulted for management of the abscess but recommend conservative therapy with oral antibiotics, topicals and Sitz Baths until a more invasive surgical approach can be considered once the patient is no longer pregnant. As the patient was previously without perinatal care, OB was consulted for current and future management of the patient's high risk pregnancy. Many labs for pregnancy management were drawn and all were  unremarkable except finding the patient has low immunity against Rubella. The patient was also found to be anemic secondary to poor nutrition and food insecurity, so she was given both a blood transfusion of 2 U PRBCs and Feraheme. For the patient's difficulties with housing, food security, and safety concerns, Social Work was consulted to help the patient find more permanent housing for her and her three other kids. The patient was deemed medically stable for discharge with her niece on 07/05/2018.  Issues for Follow Up:  1. Continue follow-up with OBGYN for prenatal care and screening 2. Continue to seek options for more permanent safe, clean housing options. 3. Use sitz bath and topical treatments for perianal abscess and hemorrhoids. Follow up out patient with colorectal surgery once no longer pregnant. 4. We highly discourage the use of other substances, especially cocaine, while pregnant. Please refrain from drug use and smoking while pregnant and after.   Significant Procedures: none  Significant Labs and Imaging:  Recent Labs  Lab 07/03/18 1308 07/04/18 0521 07/05/18 0344  WBC 6.4 6.8 7.9  HGB 6.5* 8.2* 8.6*  HCT 24.5* 28.2* 30.4*  PLT 384 344 353   Recent Labs  Lab 07/03/18 1308 07/04/18 0521  NA 136 138  K 3.7 3.9  CL 107 110  CO2 23 23  GLUCOSE 100* 91  BUN 6 7  CREATININE 0.54 0.48  CALCIUM 9.3 8.8*   Rubella IgM (7/18): negative Rubella Screen (7/19): <0.90 = NONIMMUNE HIV (7/18):non reactive nml RPR (7/18):non-reactive nml HbSAg (7/18): negative UDS (7/18): + Cocaine Blood Type: O+ HCG (7/17): 35,046 OB Urine Culture (7/18): no growth, no GBS GC (7/18):  Chlamydia (7/18):  AFP Tetra Screen (7/19):   Results/Tests Pending at Time of Discharge: AFP Tetra Screen, GC/Chlamydia  Discharge Medications:  Allergies as of 07/05/2018      Reactions   Amoxicillin Nausea And Vomiting   Has patient had a PCN reaction causing  immediate rash, facial/tongue/throat  swelling, SOB or lightheadedness with hypotension: No Has patient had a PCN reaction causing severe rash involving mucus membranes or skin necrosis: No Has patient had a PCN reaction that required hospitalization Yes Has patient had a PCN reaction occurring within the last 10 years: Yes If all of the above answers are "NO", then may proceed with Cephalosporin use.   Penicillins Nausea And Vomiting   Has patient had a PCN reaction causing immediate rash, facial/tongue/throat swelling, SOB or lightheadedness with hypotension: No Has patient had a PCN reaction causing severe rash involving mucus membranes or skin necrosis: No Has patient had a PCN reaction that required hospitalization Yes Has patient had a PCN reaction occurring within the last 10 years: No If all of the above answers are "NO", then may proceed with Cephalosporin use.      Medication List    STOP taking these medications   clindamycin 150 MG capsule Commonly known as:  CLEOCIN   clindamycin 300 MG capsule Commonly known as:  CLEOCIN   traMADol-acetaminophen 37.5-325 MG tablet Commonly known as:  ULTRACET     TAKE these medications   acetaminophen 500 MG tablet Commonly known as:  TYLENOL Take 1,000 mg by mouth every 6 (six) hours as needed for mild pain.   amoxicillin-clavulanate 875-125 MG tablet Commonly known as:  AUGMENTIN Take 1 tablet by mouth every 12 (twelve) hours.   hydrocortisone 2.5 % rectal cream Commonly known as:  ANUSOL-HC Place rectally 2 (two) times daily.   prenatal multivitamin Tabs tablet Take 1 tablet by mouth daily at 12 noon.      Discharge Instructions: Please refer to Patient Instructions section of EMR for full details.  Patient was counseled important signs and symptoms that should prompt return to medical care, changes in medications, dietary instructions, activity restrictions, and follow up appointments.   Follow-Up Appointments: Follow-up Information    [facility] [place]  [place] Follow up.   Specialty:  General Surgery Why:  Call for a follow up appointment to discuss recurrent perirectal abscess after you have delivered your baby.  Contact information: [address] [place] [address] [phone]           Dollene Cleveland, DO 07/05/2018, 2:01 PM PGY-1, [facility]

## 2018-07-05 NOTE — Progress Notes (Signed)
Pt discharged she said her niece will pick her up together with her 3 kids, never ask for assistance for transportation or co-pay for prescription, given anusol for hemorrhoids, sitz bath kit , she said she okay for now and she's happy she was been discharge.Marland Kitchen

## 2018-07-05 NOTE — Progress Notes (Signed)
Pt for discharge going home family at the bedside, discontinue peripheral IV lines, health teachings, next appointment, due meds explained and understood, given all her personal belongings, alert and oriented, no s/s of distress noted.

## 2018-07-06 LAB — RUBELLA SCREEN: Rubella: <0.9 index — ABNORMAL LOW (ref >0.99)

## 2018-07-11 LAB — AFP TETRA
AFP MoM: 0.65
AFP: 31.5 ng/mL
DIA Mom Value: 0.53
DIA Value (EIA): 102.56 pg/mL
DSR (By Age)    1 IN: 338
Down Syndrome Scr Risk Est: 720
Gest. Age on Collection Date: 17.4 WEEKS
HCG MoM: 0.83
HCG, Total: 30196 m[IU]/mL
Maternal Age At EDD: 34.4 yr
Maternal Wt: 124 [lb_av]
OSB interpretation: 10000
Test Results:: NEGATIVE
Tri 18 Scr Risk Est: 1:1319 {titer}
uE3 Mom: 0.23
uE3 Value: 0.28 ng/mL

## 2018-07-17 NOTE — BH Specialist Note (Deleted)
Integrated Behavioral Health Initial Visit  MRN: [id number] Name: [name] [name]  Number of Integrated Behavioral Health Clinician visits:: 1/6 Session Start time: ***  Session End time: *** Total time: {IBH Total Time:21014050}  Type of Service: Integrated Behavioral Health- Individual/Family Interpretor:No. Interpretor Name and Language: n/a   Warm Hand Off Completed.       SUBJECTIVE: [name] [name] is a 32 y.o. female accompanied by {CHL AMB ACCOMPANIED WU:[id number]}[phone]} Patient was referred by Nettie ElmMichael Ervin, MD for initial OB introduction to integrated behavior health services. Patient reports the following symptoms/concerns: *** Duration of problem: ***; Severity of problem: {Mild/Moderate/Severe:20260}  OBJECTIVE: Mood: {BHH MOOD:22306} and Affect: {BHH AFFECT:22307} Risk of harm to self or others: {CHL AMB BH Suicide Current Mental Status:21022748}  LIFE CONTEXT: Family and Social: *** School/Work: *** Self-Care: *** Life Changes: Current pregnancy ***  GOALS ADDRESSED: Patient will: 1. Reduce symptoms of: {IBH Symptoms:21014056} 2. Increase knowledge and/or ability of: {IBH Patient Tools:21014057}  3. Demonstrate ability to: {IBH Goals:21014053}  INTERVENTIONS: Interventions utilized: {IBH Interventions:21014054}  Standardized Assessments completed: GAD-7 and PHQ 9  ASSESSMENT: Patient currently experiencing Supervision of *** pregnancy.   Patient may benefit from initial OB introduction to integrated behavior health services ***.  PLAN: 1. Follow up with behavioral health clinician on : *** 2. Behavioral recommendations: *** 3. Referral(s): {IBH Referrals:21014055} 4. "From scale of 1-10, how likely are you to follow plan?": ***  Valetta CloseJamie C McMannes, LCSW

## 2018-07-18 ENCOUNTER — Encounter: Payer: Self-pay | Admitting: Obstetrics and Gynecology

## 2018-07-24 ENCOUNTER — Encounter: Payer: Self-pay | Admitting: Family Medicine

## 2018-07-24 ENCOUNTER — Telehealth: Payer: Self-pay

## 2018-07-24 NOTE — Telephone Encounter (Signed)
Pt called stating she needed a Proof of Pregnancy letter signed and dated with her EDD and DOB sent to [facility]. At [phone]

## 2018-08-15 ENCOUNTER — Encounter: Payer: Self-pay | Admitting: Obstetrics and Gynecology

## 2018-10-07 ENCOUNTER — Encounter (HOSPITAL_COMMUNITY): Payer: Self-pay | Admitting: *Deleted

## 2018-10-07 ENCOUNTER — Ambulatory Visit (HOSPITAL_COMMUNITY)
Admission: EM | Admit: 2018-10-07 | Discharge: 2018-10-07 | Disposition: A | Discharge status: Home / Self Care | Payer: Medicaid Other | Attending: Family Medicine | Admitting: Family Medicine

## 2018-10-07 ENCOUNTER — Other Ambulatory Visit: Payer: Self-pay

## 2018-10-07 DIAGNOSIS — K089 Disorder of teeth and supporting structures, unspecified: Secondary | ICD-10-CM | POA: Diagnosis not present

## 2018-10-07 DIAGNOSIS — Z331 Pregnant state, incidental: Secondary | ICD-10-CM | POA: Diagnosis not present

## 2018-10-07 DIAGNOSIS — S025XXB Fracture of tooth (traumatic), initial encounter for open fracture: Secondary | ICD-10-CM

## 2018-10-07 MED ORDER — AMOXICILLIN-POT CLAVULANATE 875-125 MG PO TABS: 1.0000 | ORAL_TABLET | Freq: Two times a day (BID) | ORAL | 0 refills | Status: DC

## 2018-10-07 MED ORDER — ACETAMINOPHEN 500 MG PO TABS: 1000.0000 mg | ORAL_TABLET | Freq: Four times a day (QID) | ORAL | 0 refills | Status: DC | PRN

## 2018-10-07 MED ORDER — CLINDAMYCIN HCL 300 MG PO CAPS: 300.0000 mg | ORAL_CAPSULE | Freq: Three times a day (TID) | ORAL | 0 refills | Status: DC

## 2018-10-07 NOTE — ED Provider Notes (Signed)
[facility]    CSN: [id number] Arrival date & time: 10/07/18  1515     History   Chief Complaint Chief Complaint  Patient presents with  . Dental Pain    HPI [name] [name] is a 32 y.o. female.   HPI  Pregnant, on ibuprofen and tylenol without improvement.  Known substance abuser, homeless, mother brings her in for dental fracture/pain. Brings fractured tooth piece for my review.  Has a dentist.   Requesting pain medicine   Past Medical History:  Diagnosis Date  . Abscess of axilla   . Anemia   . Anxiety   . Blood transfusion without reported diagnosis   . Depression    bipolar; not on meds  . Headache(784.0)   . Heart murmur   . Hemorrhoids   . Hypokalemia 06/17/2012  . Perianal abscess 12/2017  . 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  . Substance abuse (HCC)   . Suicide attempt (HCC) 06/19/2012  . UTI (lower urinary tract infection) 06/19/2012    Patient Active Problem List   Diagnosis Date Noted  . Iron deficiency anemia due to chronic blood loss 07/04/2018  . Perianal abscess 07/04/2018  . Cocaine abuse complicating pregnancy in second trimester (HCC) 07/04/2018  . Inadequate social support 07/04/2018  . Abdominal pain in pregnancy   . Pregnant 07/03/2018  . History of Cocaine abuse affecting pregnancy in third trimester (HCC) 04/2016 04/27/2016  . Tobacco abuse 06/17/2012    Past Surgical History:  Procedure Laterality Date  . DENTAL SURGERY    . INCISE AND DRAIN ABCESS  01/16/2018  . INCISION AND DRAINAGE PERIRECTAL ABSCESS N/A 01/16/2018   Procedure: IRRIGATION AND DEBRIDEMENT PERIRECTAL ABSCESS;  Surgeon: Sheliah Hatch, De Blanch, MD;  Location: [facility] OR;  Service: General;  Laterality: N/A;    OB History    Gravida  5   Para  3   Term  2   Preterm  1   AB  1   Living  3     SAB  1   TAB  0   Ectopic  0   Multiple  0   Live Births  3            Home Medications    Prior to Admission medications   Medication Sig  Start Date End Date Taking? Authorizing Provider  acetaminophen (TYLENOL) 500 MG tablet Take 2 tablets (1,000 mg total) by mouth every 6 (six) hours as needed for moderate pain. 10/07/18   Eustace Moore, MD  amoxicillin-clavulanate (AUGMENTIN) 875-125 MG tablet Take 1 tablet by mouth every 12 (twelve) hours. 10/07/18   Eustace Moore, MD  clindamycin (CLEOCIN) 300 MG capsule Take 1 capsule (300 mg total) by mouth 3 (three) times daily. 10/07/18   Eustace Moore, MD  ferrous sulfate 325 (65 FE) MG tablet Take 1 tablet (325 mg total) by mouth daily. 07/05/18 07/05/19  Dollene Cleveland, DO  hydrocortisone (ANUSOL-HC) 2.5 % rectal cream Place rectally 2 (two) times daily. 07/05/18   Dollene Cleveland, DO  Prenatal Vit-Fe Fumarate-FA (PRENATAL MULTIVITAMIN) TABS tablet Take 1 tablet by mouth daily at 12 noon. 07/05/18   Dollene Cleveland, DO    Family History Family History  Problem Relation Age of Onset  . Hypertension Mother   . Diabetes type II Mother   . Arthritis Mother   . Hyperlipidemia Mother   . Cancer Father        lung    Social History Social  History   Tobacco Use  . Smoking status: Light Tobacco Smoker    Packs/day: 0.50    Years: 9.00    Pack years: 4.50    Types: Cigarettes    Last attempt to quit: 01/14/2018    Years since quitting: 0.7  . Smokeless tobacco: Never Used  Substance Use Topics  . Alcohol use: Not Currently    Comment: rare  . Drug use: Not Currently    Frequency: 3.0 times per week    Types: Marijuana, Cocaine     Allergies   Patient has no active allergies.   Review of Systems Review of Systems  Constitutional: Negative for chills and fever.  HENT: Positive for dental problem. Negative for ear pain and sore throat.   Eyes: Negative for pain and visual disturbance.  Respiratory: Negative for cough and shortness of breath.   Cardiovascular: Negative for chest pain and palpitations.  Gastrointestinal: Negative for abdominal pain  and vomiting.  Genitourinary: Negative for dysuria and hematuria.  Musculoskeletal: Negative for arthralgias and back pain.  Skin: Negative for color change and rash.  Neurological: Negative for seizures and syncope.  All other systems reviewed and are negative.    Physical Exam Triage Vital Signs ED Triage Vitals  Enc Vitals Group     BP 10/07/18 1555 (!) 96/58     Pulse Rate 10/07/18 1555 63     Resp 10/07/18 1555 18     Temp 10/07/18 1555 97.8 F (36.6 C)     Temp Source 10/07/18 1555 Oral     SpO2 10/07/18 1555 100 %     Weight --      Height --      Head Circumference --      Peak Flow --      Pain Score 10/07/18 1556 7     Pain Loc --      Pain Edu? --      Excl. in GC? --    No data found.  Updated Vital Signs BP (!) 96/58 (BP Location: Right Arm)   Pulse 63   Temp 97.8 F (36.6 C) (Oral)   Resp 18   LMP 06/05/2018   SpO2 100%      Physical Exam  Constitutional: She appears well-developed and well-nourished. No distress.  Thin.  HENT:  Head: Normocephalic and atraumatic.  Right Ear: External ear normal.  Left Ear: External ear normal.  Mouth/Throat: Oropharynx is clear and moist.  Many absent teeth.  Dental fracture at the gum line left lower molar.  Redness of surrounding gums. Poor oral hygiene  Eyes: Pupils are equal, round, and reactive to light. Conjunctivae are normal.  Neck: Normal range of motion.  Cardiovascular: Normal rate.  Pulmonary/Chest: Effort normal. No respiratory distress.  Abdominal: Soft. She exhibits no distension.  Musculoskeletal: Normal range of motion. She exhibits no edema.  Neurological: She is alert.  Skin: Skin is warm and dry.  Psychiatric:  Patient does not mention pregnancy.  When I discussed medication use during pregnancy as one of my concerns, she stated "that is the least of my worries".  Poor insight and judgement     UC Treatments / Results  Labs (all labs ordered are listed, but only abnormal results are  displayed) Labs Reviewed - No data to display  EKG None  Radiology No results found.  Procedures Procedures (including critical care time)  Medications Ordered in UC Medications - No data to display  Initial Impression / Assessment and Plan /  UC Course  I have reviewed the triage vital signs and the nursing notes.  Pertinent labs & imaging results that were available during my care of the patient were reviewed by me and considered in my medical decision making (see chart for details).     Told patient to discuss ibuprofen use with her GYN Final Clinical Impressions(s) / UC Diagnoses   Final diagnoses:  Open fracture of tooth, initial encounter     Discharge Instructions     Take clindamycin 3 times a day Take acetaminophen for pain This is the only safe pain medicine during pregnancy See dentist ASAP   ED Prescriptions    Medication Sig Dispense Auth. Provider   acetaminophen (TYLENOL) 500 MG tablet Take 2 tablets (1,000 mg total) by mouth every 6 (six) hours as needed for moderate pain. 30 tablet Eustace Moore, MD   clindamycin (CLEOCIN) 300 MG capsule Take 1 capsule (300 mg total) by mouth 3 (three) times daily. 21 capsule Eustace Moore, MD     Controlled Substance Prescriptions [place] Controlled Substance Registry consulted? Not Applicable   Eustace Moore, MD 10/07/18 1715

## 2018-10-07 NOTE — Discharge Instructions (Addendum)
Take clindamycin 3 times a day Take acetaminophen for pain This is the only safe pain medicine during pregnancy See dentist ASAP

## 2018-10-07 NOTE — ED Triage Notes (Signed)
C/o dental pain x 1 week, states she broke her tooth today

## 2018-10-27 ENCOUNTER — Encounter (HOSPITAL_COMMUNITY): Payer: Self-pay

## 2018-10-27 ENCOUNTER — Inpatient Hospital Stay (HOSPITAL_COMMUNITY)
Admission: EM | Admit: 2018-10-27 | Discharge: 2018-10-27 | Disposition: A | Discharge status: Home / Self Care | Payer: Medicaid Other | Source: Ambulatory Visit | Attending: Obstetrics & Gynecology | Admitting: Obstetrics & Gynecology

## 2018-10-27 ENCOUNTER — Inpatient Hospital Stay (HOSPITAL_BASED_OUTPATIENT_CLINIC_OR_DEPARTMENT_OTHER): Payer: Medicaid Other

## 2018-10-27 DIAGNOSIS — R109 Unspecified abdominal pain: Secondary | ICD-10-CM

## 2018-10-27 DIAGNOSIS — R059 Cough, unspecified: Secondary | ICD-10-CM | POA: Diagnosis present

## 2018-10-27 DIAGNOSIS — O26893 Other specified pregnancy related conditions, third trimester: Secondary | ICD-10-CM | POA: Diagnosis not present

## 2018-10-27 DIAGNOSIS — Z3A33 33 weeks gestation of pregnancy: Secondary | ICD-10-CM | POA: Diagnosis not present

## 2018-10-27 DIAGNOSIS — O4403 Placenta previa specified as without hemorrhage, third trimester: Secondary | ICD-10-CM

## 2018-10-27 DIAGNOSIS — F17210 Nicotine dependence, cigarettes, uncomplicated: Secondary | ICD-10-CM | POA: Diagnosis not present

## 2018-10-27 DIAGNOSIS — O26853 Spotting complicating pregnancy, third trimester: Secondary | ICD-10-CM | POA: Diagnosis not present

## 2018-10-27 DIAGNOSIS — O99333 Smoking (tobacco) complicating pregnancy, third trimester: Secondary | ICD-10-CM | POA: Insufficient documentation

## 2018-10-27 DIAGNOSIS — R05 Cough: Secondary | ICD-10-CM | POA: Insufficient documentation

## 2018-10-27 DIAGNOSIS — O0933 Supervision of pregnancy with insufficient antenatal care, third trimester: Secondary | ICD-10-CM

## 2018-10-27 DIAGNOSIS — O4693 Antepartum hemorrhage, unspecified, third trimester: Secondary | ICD-10-CM | POA: Insufficient documentation

## 2018-10-27 DIAGNOSIS — Z880 Allergy status to penicillin: Secondary | ICD-10-CM | POA: Diagnosis not present

## 2018-10-27 LAB — URINALYSIS, ROUTINE W REFLEX MICROSCOPIC
Bacteria, UA: NONE SEEN
Bilirubin Urine: NEGATIVE
GLUCOSE, UA: NEGATIVE mg/dL
HGB URINE DIPSTICK: NEGATIVE
KETONES UR: NEGATIVE mg/dL
NITRITE: NEGATIVE
PROTEIN: NEGATIVE mg/dL
Specific Gravity, Urine: 1.002 — ABNORMAL LOW (ref 1.005–1.030)
pH: 7 (ref 5.0–8.0)

## 2018-10-27 LAB — WET PREP, GENITAL
CLUE CELLS WET PREP: NONE SEEN
Sperm: NONE SEEN
Trich, Wet Prep: NONE SEEN
Yeast Wet Prep HPF POC: NONE SEEN

## 2018-10-27 MED ORDER — GUAIFENESIN 100 MG/5ML PO SOLN
5.0000 mL | Freq: Once | ORAL | Status: AC
  Administered 2018-10-27: 5 mL via ORAL
  Filled 2018-10-27: qty 15

## 2018-10-27 NOTE — MAU Provider Note (Addendum)
History     CSN: [id number]  Arrival date and time: 10/27/18 1851   First Provider Initiated Contact with Patient 10/27/18 1950      Chief Complaint  Patient presents with  . Vaginal Bleeding   HPI  Ms.  [name] [name] is a 32 y.o. year old G19P2113 female at [redacted]w[redacted]d weeks gestation by 2nd trimester U/S in the ED who presents to MAU reporting VB with wiping and a couple of blood clots over past couple of days. She has not received any prenatal care with this pregnancy. She has been to [facility] July [facility].  Past Medical History:  Diagnosis Date  . Abscess of axilla   . Anemia   . Anxiety   . Blood transfusion without reported diagnosis   . Depression    bipolar; not on meds  . Headache(784.0)   . Heart murmur   . Hemorrhoids   . Hypokalemia 06/17/2012  . Perianal abscess 12/2017  . 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  . Substance abuse (HCC)   . Suicide attempt (HCC) 06/19/2012  . UTI (lower urinary tract infection) 06/19/2012    Past Surgical History:  Procedure Laterality Date  . DENTAL SURGERY    . INCISE AND DRAIN ABCESS  01/16/2018  . INCISION AND DRAINAGE PERIRECTAL ABSCESS N/A 01/16/2018   Procedure: IRRIGATION AND DEBRIDEMENT PERIRECTAL ABSCESS;  Surgeon: Sheliah Hatch De Blanch, MD;  Location: [facility];  Service: General;  Laterality: N/A;    Family History  Problem Relation Age of Onset  . Hypertension Mother   . Diabetes type II Mother   . Arthritis Mother   . Hyperlipidemia Mother   . Cancer Father        lung    Social History   Tobacco Use  . Smoking status: Light Tobacco Smoker    Packs/day: 0.50    Years: 9.00    Pack years: 4.50    Types: Cigarettes    Last attempt to quit: 01/14/2018    Years since quitting: 0.7  . Smokeless tobacco: Never Used  Substance Use Topics  . Alcohol use: Not Currently    Comment: rare  . Drug use: Not Currently    Frequency: 3.0 times per week    Types: Marijuana, Cocaine    Allergies:  Allergies  Allergen  Reactions  . Penicillins Nausea And Vomiting    Has patient had a PCN reaction causing immediate rash, facial/tongue/throat swelling, SOB or lightheadedness with hypotension: No Has patient had a PCN reaction causing severe rash involving mucus membranes or skin necrosis: No Has patient had a PCN reaction that required hospitalization Yes Has patient had a PCN reaction occurring within the last 10 years: No If all of the above answers are "NO", then may proceed with Cephalosporin use.     Medications Prior to Admission  Medication Sig Dispense Refill Last Dose  . acetaminophen (TYLENOL) 500 MG tablet Take 2 tablets (1,000 mg total) by mouth every 6 (six) hours as needed for moderate pain. 30 tablet 0   . amoxicillin-clavulanate (AUGMENTIN) 875-125 MG tablet Take 1 tablet by mouth every 12 (twelve) hours. 10 tablet 0   . clindamycin (CLEOCIN) 300 MG capsule Take 1 capsule (300 mg total) by mouth 3 (three) times daily. 21 capsule 0   . ferrous sulfate 325 (65 FE) MG tablet Take 1 tablet (325 mg total) by mouth daily. 30 tablet 3   . hydrocortisone (ANUSOL-HC) 2.5 % rectal cream Place rectally 2 (two) times daily. 30 g  0   . Prenatal Vit-Fe Fumarate-FA (PRENATAL MULTIVITAMIN) TABS tablet Take 1 tablet by mouth daily at 12 noon. 30 tablet 2     Review of Systems  Constitutional: Negative.   HENT: Negative.   Eyes: Negative.   Respiratory: Positive for cough.   Cardiovascular: Negative.   Gastrointestinal: Negative.   Endocrine: Negative.   Genitourinary: Positive for vaginal bleeding.  Musculoskeletal: Negative.   Skin: Negative.   Allergic/Immunologic: Negative.   Neurological: Negative.   Hematological: Negative.   Psychiatric/Behavioral: Negative.    Physical Exam   Blood pressure (!) 119/57, pulse 83, temperature 98.6 F (37 C), temperature source Oral, resp. rate 20, weight 60.1 kg, last menstrual period 03/09/2018.  Physical Exam  Nursing note and vitals  reviewed. Constitutional: She is oriented to person, place, and time. She appears well-developed and well-nourished.  HENT:  Head: Normocephalic and atraumatic.  Eyes: Pupils are equal, round, and reactive to light.  Neck: Normal range of motion.  Cardiovascular: Normal rate.  Respiratory: Effort normal.  GI: Soft.  Genitourinary:       Genitourinary Comments: Uterus: gravid, S=D, SE: cervix is smooth, pink, very friable 2 cm polyp obscuring visualization of cervical os,  scant amt of pink vaginal d/c -- WP, GC/CT done, VE: 1/50%/-3/unable to determine, no CMT or friability, no adnexal tenderness // large 2.5 cm long non-thrombosed hemorrhoid obscuring anal opening  Musculoskeletal: Normal range of motion.  Neurological: She is alert and oriented to person, place, and time.  Skin: Skin is warm and dry.  Psychiatric: She has a normal mood and affect. Her behavior is normal. Judgment and thought content normal.   MAU Course  Procedures  MDM CCUA Wet Prep GC/CT -- pending Robitussin 5 ml po OB MFM Limited U/S: posterior placenta / AFI WNL / no abruption / no placental previa  *Consult with Dr. Erin Fulling @ 2145 - notified of patient's complaints, assessments, lab & U/S results, recommended tx plan d/c home, F/U with [facility]  Results for orders placed or performed during the hospital encounter of 10/27/18 (from the past 24 hour(s))  Urinalysis, Routine w reflex microscopic     Status: Abnormal   Collection Time: 10/27/18  7:34 PM  Result Value Ref Range   Color, Urine STRAW (A) YELLOW   APPearance CLEAR CLEAR   Specific Gravity, Urine 1.002 (L) 1.005 - 1.030   pH 7.0 5.0 - 8.0   Glucose, UA NEGATIVE NEGATIVE mg/dL   Hgb urine dipstick NEGATIVE NEGATIVE   Bilirubin Urine NEGATIVE NEGATIVE   Ketones, ur NEGATIVE NEGATIVE mg/dL   Protein, ur NEGATIVE NEGATIVE mg/dL   Nitrite NEGATIVE NEGATIVE   Leukocytes, UA TRACE (A) NEGATIVE   RBC / HPF 0-5 0 - 5 RBC/hpf   WBC, UA 0-5 0 -  5 WBC/hpf   Bacteria, UA NONE SEEN NONE SEEN  Wet prep, genital     Status: Abnormal   Collection Time: 10/27/18  7:41 PM  Result Value Ref Range   Yeast Wet Prep HPF POC NONE SEEN NONE SEEN   Trich, Wet Prep NONE SEEN NONE SEEN   Clue Cells Wet Prep HPF POC NONE SEEN NONE SEEN   WBC, Wet Prep HPF POC MANY (A) NONE SEEN   Sperm NONE SEEN    Assessment and Plan  Cough - Advised to take Robitussin  - Safe Med list in pregnancy list given  Spotting affecting pregnancy in third trimester - Bleeding precautions given - Advised to start [facility] ASAP - List given of  [place] [facility] Providers - Msg sent to [facility] to get patient scheduled ASAP   - Discharge patient - Patient verbalized an understanding of the plan of care and agrees.     Raelyn Mora, MSN, CNM 10/27/2018, 7:58 PM

## 2018-10-27 NOTE — MAU Note (Signed)
Has been seeing blood when wiping over the past couple of days, had a couple blood clots  No abdominal pain, feeling FM

## 2018-10-27 NOTE — Discharge Instructions (Signed)
The bleeding is coming from a cervical polyp; which is a harmless growth on your cervix. You will need to get scheduled for prenatal care and they will be able to remove that in the office.   Safe Medications in Pregnancy   Acne: Benzoyl Peroxide Salicylic Acid  Backache/Headache: Tylenol: 2 regular strength every 4 hours OR              2 Extra strength every 6 hours  Colds/Coughs/Allergies: Benadryl (alcohol free) 25 mg every 6 hours as needed Breath right strips Claritin Cepacol throat lozenges Chloraseptic throat spray Cold-Eeze- up to three times per day Cough drops, alcohol free Flonase (by prescription only) Guaifenesin Mucinex Robitussin DM (plain only, alcohol free) Saline nasal spray/drops Sudafed (pseudoephedrine) & Actifed ** use only after [redacted] weeks gestation and if you do not have high blood pressure Tylenol Vicks Vaporub Zinc lozenges Zyrtec   Constipation: Colace Ducolax suppositories Fleet enema Glycerin suppositories Metamucil Milk of magnesia Miralax Senokot Smooth move tea  Diarrhea: Kaopectate Imodium A-D  *NO pepto Bismol  Hemorrhoids: Anusol Anusol HC Preparation H Tucks  Indigestion: Tums Maalox Mylanta Zantac  Pepcid  Insomnia: Benadryl (alcohol free) 25mg  every 6 hours as needed Tylenol PM Unisom, no Gelcaps  Leg Cramps: Tums MagGel  Nausea/Vomiting:  Bonine Dramamine Emetrol Ginger extract Sea bands Meclizine  Nausea medication to take during pregnancy:  Unisom (doxylamine succinate 25 mg tablets) Take one tablet daily at bedtime. If symptoms are not adequately controlled, the dose can be increased to a maximum recommended dose of two tablets daily (1/2 tablet in the morning, 1/2 tablet mid-afternoon and one at bedtime). Vitamin B6 100mg  tablets. Take one tablet twice a day (up to 200 mg per day).  Skin Rashes: Aveeno products Benadryl cream or 25mg  every 6 hours as needed Calamine Lotion 1% cortisone  cream  Yeast infection: Gyne-lotrimin 7 Monistat 7   **If taking multiple medications, please check labels to avoid duplicating the same active ingredients **take medication as directed on the label ** Do not exceed 4000 mg of tylenol in 24 hours **Do not take medications that contain aspirin or ibuprofen

## 2018-10-28 LAB — GC/CHLAMYDIA PROBE AMP (~~LOC~~) NOT AT ARMC
Chlamydia: NEGATIVE
NEISSERIA GONORRHEA: NEGATIVE

## 2018-11-04 ENCOUNTER — Encounter: Payer: Self-pay | Admitting: Obstetrics & Gynecology

## 2018-11-11 ENCOUNTER — Ambulatory Visit: Payer: Medicaid Other | Admitting: Clinical

## 2018-11-11 ENCOUNTER — Ambulatory Visit (INDEPENDENT_AMBULATORY_CARE_PROVIDER_SITE_OTHER): Payer: Medicaid Other | Admitting: Obstetrics & Gynecology

## 2018-11-11 ENCOUNTER — Encounter: Payer: Self-pay | Admitting: Obstetrics & Gynecology

## 2018-11-11 ENCOUNTER — Other Ambulatory Visit (HOSPITAL_COMMUNITY)
Admission: RE | Admit: 2018-11-11 | Discharge: 2018-11-11 | Disposition: A | Discharge status: Home / Self Care | Payer: Medicaid Other | Source: Ambulatory Visit | Attending: Obstetrics & Gynecology | Admitting: Obstetrics & Gynecology

## 2018-11-11 VITALS — BP 106/73 | HR 73 | Wt 137.3 lb

## 2018-11-11 DIAGNOSIS — O99323 Drug use complicating pregnancy, third trimester: Secondary | ICD-10-CM | POA: Diagnosis not present

## 2018-11-11 DIAGNOSIS — O0930 Supervision of pregnancy with insufficient antenatal care, unspecified trimester: Secondary | ICD-10-CM | POA: Insufficient documentation

## 2018-11-11 DIAGNOSIS — O0993 Supervision of high risk pregnancy, unspecified, third trimester: Secondary | ICD-10-CM | POA: Diagnosis not present

## 2018-11-11 DIAGNOSIS — O0933 Supervision of pregnancy with insufficient antenatal care, third trimester: Secondary | ICD-10-CM | POA: Diagnosis not present

## 2018-11-11 DIAGNOSIS — O0990 Supervision of high risk pregnancy, unspecified, unspecified trimester: Secondary | ICD-10-CM | POA: Diagnosis not present

## 2018-11-11 DIAGNOSIS — O99322 Drug use complicating pregnancy, second trimester: Secondary | ICD-10-CM

## 2018-11-11 DIAGNOSIS — F1410 Cocaine abuse, uncomplicated: Secondary | ICD-10-CM

## 2018-11-11 LAB — POCT URINALYSIS DIP (DEVICE)
BILIRUBIN URINE: NEGATIVE
Glucose, UA: NEGATIVE mg/dL
KETONES UR: NEGATIVE mg/dL
Leukocytes, UA: NEGATIVE
Nitrite: NEGATIVE
PH: 6 (ref 5.0–8.0)
PROTEIN: NEGATIVE mg/dL
SPECIFIC GRAVITY, URINE: 1.015 (ref 1.005–1.030)
Urobilinogen, UA: 0.2 mg/dL (ref 0.0–1.0)

## 2018-11-11 NOTE — Progress Notes (Signed)
Subjective:    [name] [name] is a [id number] [redacted]w[redacted]d being seen today for her first obstetrical visit.  Her obstetrical history is significant for h/o cocaine use, late to care. Patient does intend to breast feed. Pregnancy history fully reviewed.  Patient reports occasional contractions.  Vitals:   11/11/18 1335  BP: 106/73  Pulse: 73  Weight: 137 lb 4.8 oz (62.3 kg)    HISTORY: OB History  Gravida Para Term Preterm AB Living  5 3 2 1 1 3   SAB TAB Ectopic Multiple Live Births  1 0 0 0 3    # Outcome Date GA Lbr Len/2nd Weight Sex Delivery Anes PTL Lv  5 Current           4 Term 04/27/16 [redacted]w[redacted]d 01:50 / 00:05 5 lb 10.7 oz (2.57 kg) F Vag-Spont None  LIV  3 Preterm 09/04/06 [redacted]w[redacted]d  4 lb 6 oz (1.984 kg) F Vag-Spont   LIV     Birth Comments: baby had gastroscesis  2 Term 05/27/03 [redacted]w[redacted]d  6 lb 10 oz (3.005 kg) M Vag-Spont  N LIV  1 SAB            Past Medical History:  Diagnosis Date  . Abscess of axilla   . Anemia   . Anxiety   . Blood transfusion without reported diagnosis   . Depression    bipolar; not on meds  . Headache(784.0)   . Heart murmur   . Hemorrhoids   . Hypokalemia 06/17/2012  . Perianal abscess 12/2017  . 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  . Substance abuse (HCC)   . Suicide attempt (HCC) 06/19/2012  . UTI (lower urinary tract infection) 06/19/2012   Past Surgical History:  Procedure Laterality Date  . DENTAL SURGERY    . INCISE AND DRAIN ABCESS  01/16/2018  . INCISION AND DRAINAGE PERIRECTAL ABSCESS N/A 01/16/2018   Procedure: IRRIGATION AND DEBRIDEMENT PERIRECTAL ABSCESS;  Surgeon: Sheliah Hatch De Blanch, MD;  Location: [facility] OR;  Service: General;  Laterality: N/A;   Family History  Problem Relation Age of Onset  . Hypertension Mother   . Diabetes type II Mother   . Arthritis Mother   . Hyperlipidemia Mother   . Cancer Father        lung     Exam    Uterus:  Fundal Height: 31 cm  Pelvic Exam:    Perineum: Hemorrhoids   Vulva: normal   Vagina:   normal mucosa   pH:     Cervix: 2 cm anterior polyp   Adnexa: not evaluated   Bony Pelvis: average  System: Breast:  normal appearance, no masses or tenderness   Skin: normal coloration and turgor, no rashes    Neurologic: oriented, normal mood   Extremities: normal strength, tone, and muscle mass   HEENT thyroid without masses   Mouth/Teeth mucous membranes moist, pharynx normal without lesions and dental hygiene good   Neck supple   Cardiovascular: regular rate and rhythm   Respiratory:  appears well, vitals normal, no respiratory distress, acyanotic, normal RR, chest clear, no wheezing, crepitations, rhonchi, normal symmetric air entry   Abdomen: gravid   Urinary: urethral meatus normal      Assessment:    Pregnancy: [id number] Patient Active Problem List   Diagnosis Date Noted  . Supervision of high risk pregnancy, antepartum 11/11/2018  . Late prenatal care affecting pregnancy, antepartum 11/11/2018  . Cough 10/27/2018  . Iron deficiency anemia due to chronic blood loss 07/04/2018  .  Perianal abscess 07/04/2018  . Cocaine abuse complicating pregnancy in second trimester (HCC) 07/04/2018  . Inadequate social support 07/04/2018  . Abdominal pain in pregnancy   . Pregnant 07/03/2018  . History of Cocaine abuse affecting pregnancy in third trimester (HCC) 04/2016 04/27/2016  . Tobacco abuse 06/17/2012        Plan:     Initial labs drawn. Prenatal vitamins. Problem list reviewed and updated.  Ultrasound discussed; fetal survey: ordered.  Follow up in 1 weeks. 50% of 30 min visit spent on counseling and coordination of care.  Pap done today   Scheryl DarterJames Waylen Depaolo 11/11/2018

## 2018-11-11 NOTE — BH Specialist Note (Signed)
Integrated Behavioral Health Initial Visit  MRN: [id number] Name: [name] [name]  Number of Integrated Behavioral Health Clinician visits:: 1/6 Session Start time: 2:30  Session End time: 2:40 Total time: 15 minutes  Type of Service: Integrated Behavioral Health- Individual/Family Interpretor:No. Interpretor Name and Language: n/a   Warm Hand Off Completed.       SUBJECTIVE: [name] [name] is a 32 y.o. female accompanied by n/a Patient was referred by Scheryl DarterJames Arnold, MD for Initial OB introduction to integrated behavioral health services . Patient reports the following symptoms/concerns: Pt states her primary concern today is feeling anxious over having her blood drawn, and being eager to give birth as soon as possible. No other concerns at this time.  Duration of problem: Current pregnancy; Severity of problem: mild  OBJECTIVE: Mood: Anxious and Affect: Appropriate Risk of harm to self or others: No plan to harm self or others  LIFE CONTEXT: Family and Social: Pt lives in maternity home; pt has 3 children(15yo son, 12yo and 2yo daughters); children stay with pt's mother School/Work: - Self-Care: - Life Changes: Current pregnancy; move into maternity home  GOALS ADDRESSED: Patient will: 1. Reduce symptoms of: anxiety today 2. Increase knowledge and/or ability of: healthy habits  3. Demonstrate ability to: Increase healthy adjustment to current life circumstances  INTERVENTIONS: Interventions utilized: Psychoeducation and/or Health Education  Standardized Assessments completed: GAD-7 and PHQ 9  ASSESSMENT: Patient currently experiencing Supervision of high risk pregnancy, antepartum.   Patient may benefit from Initial OB introduction to integrated behavioral health services .  PLAN: 1. Follow up with behavioral health clinician on : One week 2. Behavioral recommendations:  -Take prenatal vitamin, as recommended by medical provider -Eat a full meal upon leaving visit  today 3. Referral(s): Integrated Behavioral Health Services (In Clinic) 4. "From scale of 1-10, how likely are you to follow plan?": -  Rae LipsJamie C Carena Stream, LCSW     Depression screen [facility] 2/[facility] 11/11/2018  Decreased Interest 1  Down, Depressed, Hopeless 1  PHQ - 2 Score 2  Altered sleeping 1  Tired, decreased energy 2  Change in appetite 1  Feeling bad or failure about yourself  1  Trouble concentrating 0  Moving slowly or fidgety/restless 0  Suicidal thoughts 0  PHQ-9 Score 7   GAD 7 : Generalized Anxiety Score 11/11/2018  Nervous, Anxious, on Edge 2  Control/stop worrying 0  Worry too much - different things 1  Trouble relaxing 1  Restless 2  Easily annoyed or irritable 2  Afraid - awful might happen 1  Total GAD 7 Score 9

## 2018-11-11 NOTE — Patient Instructions (Signed)

## 2018-11-15 LAB — CYTOLOGY - PAP
CHLAMYDIA, DNA PROBE: NEGATIVE
DIAGNOSIS: UNDETERMINED — AB
HPV 16/18/45 genotyping: NEGATIVE
HPV: DETECTED — AB
NEISSERIA GONORRHEA: NEGATIVE

## 2018-11-16 LAB — CULTURE, OB URINE

## 2018-11-16 LAB — URINE CULTURE, OB REFLEX

## 2018-11-19 ENCOUNTER — Telehealth: Payer: Self-pay

## 2018-11-19 ENCOUNTER — Other Ambulatory Visit: Payer: Self-pay | Admitting: Obstetrics & Gynecology

## 2018-11-19 DIAGNOSIS — O0990 Supervision of high risk pregnancy, unspecified, unspecified trimester: Secondary | ICD-10-CM

## 2018-11-19 LAB — OBSTETRIC PANEL, INCLUDING HIV
Antibody Screen: NEGATIVE
BASOS ABS: 0 10*3/uL (ref 0.0–0.2)
Basos: 0 %
EOS (ABSOLUTE): 0.1 10*3/uL (ref 0.0–0.4)
EOS: 1 %
HEP B S AG: NEGATIVE
HIV SCREEN 4TH GENERATION: NONREACTIVE
Hematocrit: 32.4 % — ABNORMAL LOW (ref 34.0–46.6)
Hemoglobin: 9.7 g/dL — ABNORMAL LOW (ref 11.1–15.9)
Immature Grans (Abs): 0 10*3/uL (ref 0.0–0.1)
Immature Granulocytes: 1 %
LYMPHS ABS: 1.9 10*3/uL (ref 0.7–3.1)
Lymphs: 29 %
MCH: 20.9 pg — ABNORMAL LOW (ref 26.6–33.0)
MCHC: 29.9 g/dL — ABNORMAL LOW (ref 31.5–35.7)
MCV: 70 fL — ABNORMAL LOW (ref 79–97)
Monocytes Absolute: 0.4 10*3/uL (ref 0.1–0.9)
Monocytes: 6 %
Neutrophils Absolute: 4 10*3/uL (ref 1.4–7.0)
Neutrophils: 63 %
PLATELETS: 365 10*3/uL (ref 150–450)
RBC: 4.65 x10E6/uL (ref 3.77–5.28)
RDW: 17.9 % — ABNORMAL HIGH (ref 12.3–15.4)
RH TYPE: POSITIVE
RPR: NONREACTIVE
Rubella Antibodies, IGG: <0.9 index — ABNORMAL LOW (ref >0.99)
WBC: 6.4 10*3/uL (ref 3.4–10.8)

## 2018-11-19 LAB — HEMOGLOBINOPATHY EVALUATION
Ferritin: 53 ng/mL (ref 15–150)
HGB A2 QUANT: 1.6 % — AB (ref 1.8–3.2)
HGB A: 98.4 % (ref 96.4–98.8)
Hgb C: 0 %
Hgb F Quant: 0 % (ref 0.0–2.0)
Hgb S: 0 %
Hgb Solubility: NEGATIVE
Hgb Variant: 0 %

## 2018-11-19 LAB — SMN1 COPY NUMBER ANALYSIS (SMA CARRIER SCREENING)

## 2018-11-19 LAB — ALPHA-THALASSEMIA

## 2018-11-19 LAB — GLUCOSE TOLERANCE, 1 HOUR: Glucose, 1Hr PP: 73 mg/dL (ref 65–199)

## 2018-11-19 LAB — CYSTIC FIBROSIS GENE TEST

## 2018-11-19 MED ORDER — METRONIDAZOLE 500 MG PO TABS: 2000.0000 mg | ORAL_TABLET | Freq: Once | ORAL | 0 refills | Status: AC

## 2018-11-19 NOTE — Telephone Encounter (Signed)
Called pt to advise of Pap results & Rx sent for Trich & that a Colpo has been suggested at Postpartum visit. Advised pt to let partner know to get Tx asap & no sex until 2 weeks after the last person is treated. Pt verbalized understanding.

## 2018-11-19 NOTE — Telephone Encounter (Signed)
-----   Message from Adam PhenixJames G Arnold, MD sent at 11/19/2018 11:43 AM EST ----- Flagyl Rx sent for trichomonas. Pap abnormal, recommend colposcopy postpartum

## 2018-11-21 ENCOUNTER — Telehealth: Payer: Self-pay | Admitting: Obstetrics and Gynecology

## 2018-11-21 ENCOUNTER — Encounter: Payer: Self-pay | Admitting: Obstetrics and Gynecology

## 2018-11-21 NOTE — Telephone Encounter (Signed)
[facility] pt [facility] No answer and LVM to call the office to be rescheduled. No Show Letter mailed.

## 2018-11-22 ENCOUNTER — Telehealth: Payer: Self-pay | Admitting: *Deleted

## 2018-11-22 NOTE — Telephone Encounter (Signed)
Pt calling to report that the medication she was supposed to have called in to her pharmacy was not called in. Requesting a call back to discuss.

## 2018-11-25 ENCOUNTER — Encounter: Payer: Self-pay | Admitting: Obstetrics and Gynecology

## 2018-11-25 ENCOUNTER — Other Ambulatory Visit (HOSPITAL_COMMUNITY)
Admission: RE | Admit: 2018-11-25 | Discharge: 2018-11-25 | Disposition: A | Discharge status: Home / Self Care | Payer: Medicaid Other | Source: Ambulatory Visit | Attending: Obstetrics and Gynecology | Admitting: Obstetrics and Gynecology

## 2018-11-25 ENCOUNTER — Ambulatory Visit (INDEPENDENT_AMBULATORY_CARE_PROVIDER_SITE_OTHER): Payer: Medicaid Other | Admitting: Obstetrics and Gynecology

## 2018-11-25 VITALS — BP 101/67 | HR 61 | Wt 141.0 lb

## 2018-11-25 DIAGNOSIS — O0930 Supervision of pregnancy with insufficient antenatal care, unspecified trimester: Secondary | ICD-10-CM

## 2018-11-25 DIAGNOSIS — A599 Trichomoniasis, unspecified: Secondary | ICD-10-CM | POA: Diagnosis not present

## 2018-11-25 DIAGNOSIS — Z283 Underimmunization status: Secondary | ICD-10-CM | POA: Diagnosis not present

## 2018-11-25 DIAGNOSIS — O0993 Supervision of high risk pregnancy, unspecified, third trimester: Secondary | ICD-10-CM | POA: Diagnosis not present

## 2018-11-25 DIAGNOSIS — Z2839 Other underimmunization status: Secondary | ICD-10-CM | POA: Insufficient documentation

## 2018-11-25 DIAGNOSIS — O99322 Drug use complicating pregnancy, second trimester: Secondary | ICD-10-CM

## 2018-11-25 DIAGNOSIS — O0990 Supervision of high risk pregnancy, unspecified, unspecified trimester: Secondary | ICD-10-CM

## 2018-11-25 DIAGNOSIS — O99323 Drug use complicating pregnancy, third trimester: Secondary | ICD-10-CM

## 2018-11-25 DIAGNOSIS — F1410 Cocaine abuse, uncomplicated: Secondary | ICD-10-CM

## 2018-11-25 DIAGNOSIS — O0933 Supervision of pregnancy with insufficient antenatal care, third trimester: Secondary | ICD-10-CM

## 2018-11-25 DIAGNOSIS — O09899 Supervision of other high risk pregnancies, unspecified trimester: Secondary | ICD-10-CM

## 2018-11-25 DIAGNOSIS — O9989 Other specified diseases and conditions complicating pregnancy, childbirth and the puerperium: Secondary | ICD-10-CM | POA: Diagnosis not present

## 2018-11-25 DIAGNOSIS — R87810 Cervical high risk human papillomavirus (HPV) DNA test positive: Secondary | ICD-10-CM

## 2018-11-25 DIAGNOSIS — R87610 Atypical squamous cells of undetermined significance on cytologic smear of cervix (ASC-US): Secondary | ICD-10-CM

## 2018-11-25 MED ORDER — METRONIDAZOLE 500 MG PO TABS: ORAL_TABLET | ORAL | 0 refills | Status: DC

## 2018-11-25 NOTE — Progress Notes (Signed)
   PRENATAL VISIT NOTE  Subjective:  [name] [name] is a 32 y.o. [id number] at 71w2dbeing seen today for ongoing prenatal care.  She is currently monitored for the following issues for this high-risk pregnancy and has Tobacco abuse; History of Cocaine abuse affecting pregnancy in third trimester ([place] 04/2016; Pregnant; Abdominal pain in pregnancy; Iron deficiency anemia due to chronic blood loss; Perianal abscess; Cocaine abuse complicating pregnancy in second trimester ([place]; Inadequate social support; Cough; Supervision of high risk pregnancy, antepartum; Late prenatal care affecting pregnancy, antepartum; ASCUS with positive high risk HPV cervical; Rubella non-immune status, antepartum; and Trichomonas infection on their problem list.  Patient reports vaginal discharge, has not taken medication for trichomonas.  Contractions: Not present. Vag. Bleeding: None.  Movement: Present. Denies leaking of fluid.   The following portions of the patient's history were reviewed and updated as appropriate: allergies, current medications, past family history, past medical history, past social history, past surgical history and problem list. Problem list updated.  Objective:   Vitals:   11/25/18 1042  BP: 101/67  Pulse: 61  Weight: 141 lb (64 kg)    Fetal Status: Fetal Heart Rate (bpm): 144 Fundal Height: 31 cm Movement: Present     General:  Alert, oriented and cooperative. Patient is in no acute distress.  Skin: Skin is warm and dry. No rash noted.   Cardiovascular: Normal heart rate noted  Respiratory: Normal respiratory effort, no problems with respiration noted  Abdomen: Soft, gravid, appropriate for gestational age.  Pain/Pressure: Present     Pelvic: Cervical exam deferred        Extremities: Normal range of motion.  Edema: Trace  Mental Status: Normal mood and affect. Normal behavior. Normal judgment and thought content.   Assessment and Plan:  Pregnancy: [id number] 331w2d1. Supervision  of high risk pregnancy, antepartum - Strep Gp B Culture+Rflx - [facility]lam[facility] probe amp [facility]. Late prenatal care affecting pregnancy, antepartum NOB @ 35 weeks Anatomy rescheduled  3. ASCUS with positive high risk HPV cervical colpo pp  4. Rubella non-immune status, antepartum MMR pp  5. [place] abuse complicating [place] in [place] ([place]- Refused UDS today, states she is not doing cocaine but sometimes takes "rollups" which she does not know what is in it, thinks it would be positive today but will consent next week - Encouraged her to not take unknown substances from people, reviewed risks of abruption and fetal death with cocaine  6. Trichomonas - has not picked up flagyl script, resent to [facility] today - reviewed importance of informing partner and need for treatment   Term labor symptoms and general obstetric precautions including but not limited to vaginal bleeding, contractions, leaking of fluid and fetal movement were reviewed in detail with the patient. Please refer to After Visit Summary for other counseling recommendations.  Return in about 1 week (around 12/02/2018) for OB visit (MD).  Future Appointments  Date Time Provider [place]12/10/2018  3:00 PM [facility] [place] WH-MFCUS M[facility]-US    KeSloan LeiterMD

## 2018-11-25 NOTE — Telephone Encounter (Signed)
Per chart Leen was seen in our office today but I can not verify if issue with medication addressed. I called and spoke with a female who said she is her Mother and she has the phone and is not at home ; but she can give message to Rainn. I asked her to please let Jermiya know I was calling and that I know she was seen today - please call us back if her issue not addressed.

## 2018-11-27 ENCOUNTER — Encounter (HOSPITAL_COMMUNITY): Payer: Self-pay

## 2018-11-27 ENCOUNTER — Telehealth: Payer: Self-pay

## 2018-11-27 ENCOUNTER — Ambulatory Visit (HOSPITAL_BASED_OUTPATIENT_CLINIC_OR_DEPARTMENT_OTHER)
Admission: RE | Admit: 2018-11-27 | Discharge: 2018-11-27 | Disposition: A | Discharge status: Home / Self Care | Payer: Medicaid Other | Source: Ambulatory Visit | Attending: Obstetrics & Gynecology | Admitting: Obstetrics & Gynecology

## 2018-11-27 ENCOUNTER — Other Ambulatory Visit: Payer: Self-pay | Admitting: Obstetrics & Gynecology

## 2018-11-27 DIAGNOSIS — Z283 Underimmunization status: Secondary | ICD-10-CM

## 2018-11-27 DIAGNOSIS — O09213 Supervision of pregnancy with history of pre-term labor, third trimester: Secondary | ICD-10-CM

## 2018-11-27 DIAGNOSIS — O365930 Maternal care for other known or suspected poor fetal growth, third trimester, not applicable or unspecified: Secondary | ICD-10-CM

## 2018-11-27 DIAGNOSIS — O352XX0 Maternal care for (suspected) hereditary disease in fetus, not applicable or unspecified: Secondary | ICD-10-CM

## 2018-11-27 DIAGNOSIS — R87810 Cervical high risk human papillomavirus (HPV) DNA test positive: Secondary | ICD-10-CM

## 2018-11-27 DIAGNOSIS — R05 Cough: Secondary | ICD-10-CM

## 2018-11-27 DIAGNOSIS — Z363 Encounter for antenatal screening for malformations: Secondary | ICD-10-CM | POA: Insufficient documentation

## 2018-11-27 DIAGNOSIS — O09899 Supervision of other high risk pregnancies, unspecified trimester: Secondary | ICD-10-CM

## 2018-11-27 DIAGNOSIS — O0990 Supervision of high risk pregnancy, unspecified, unspecified trimester: Secondary | ICD-10-CM

## 2018-11-27 DIAGNOSIS — O0933 Supervision of pregnancy with insufficient antenatal care, third trimester: Secondary | ICD-10-CM | POA: Insufficient documentation

## 2018-11-27 DIAGNOSIS — Z3A37 37 weeks gestation of pregnancy: Secondary | ICD-10-CM | POA: Insufficient documentation

## 2018-11-27 DIAGNOSIS — A599 Trichomoniasis, unspecified: Secondary | ICD-10-CM

## 2018-11-27 DIAGNOSIS — R059 Cough, unspecified: Secondary | ICD-10-CM

## 2018-11-27 DIAGNOSIS — R87610 Atypical squamous cells of undetermined significance on cytologic smear of cervix (ASC-US): Secondary | ICD-10-CM

## 2018-11-27 DIAGNOSIS — O9989 Other specified diseases and conditions complicating pregnancy, childbirth and the puerperium: Secondary | ICD-10-CM

## 2018-11-27 DIAGNOSIS — O99323 Drug use complicating pregnancy, third trimester: Secondary | ICD-10-CM

## 2018-11-27 LAB — GC/CHLAMYDIA PROBE AMP (~~LOC~~) NOT AT ARMC
CHLAMYDIA, DNA PROBE: NEGATIVE
Neisseria Gonorrhea: NEGATIVE

## 2018-11-27 NOTE — Telephone Encounter (Addendum)
-----   Message from Catalina AntiguaPeggy Constant, MD sent at 11/27/2018  5:48 PM EST ----- Patient seen by MFM on 11/27/2018 and left without being seen by MFM attending. MFM is recommending IOL due to fetal growth restriction in the setting of cocaine abuse in pregnancy.  Please contact the patient to come in for IOL on 12/12  Thanks  Peggy   Attempted to contact message on both numbers provided and LM that we need her to call the office at her earliest convienence.

## 2018-11-28 ENCOUNTER — Inpatient Hospital Stay (HOSPITAL_COMMUNITY)
Admission: AD | Admit: 2018-11-28 | Discharge: 2018-12-01 | DRG: 806 | Disposition: A | Discharge status: Home / Self Care | Payer: Medicaid Other | Attending: Obstetrics and Gynecology | Admitting: Obstetrics and Gynecology

## 2018-11-28 ENCOUNTER — Encounter (HOSPITAL_COMMUNITY): Payer: Self-pay | Admitting: *Deleted

## 2018-11-28 DIAGNOSIS — O322XX0 Maternal care for transverse and oblique lie, not applicable or unspecified: Secondary | ICD-10-CM | POA: Diagnosis present

## 2018-11-28 DIAGNOSIS — F1410 Cocaine abuse, uncomplicated: Secondary | ICD-10-CM | POA: Diagnosis present

## 2018-11-28 DIAGNOSIS — A5901 Trichomonal vulvovaginitis: Secondary | ICD-10-CM | POA: Diagnosis present

## 2018-11-28 DIAGNOSIS — F17210 Nicotine dependence, cigarettes, uncomplicated: Secondary | ICD-10-CM | POA: Diagnosis present

## 2018-11-28 DIAGNOSIS — O9902 Anemia complicating childbirth: Secondary | ICD-10-CM | POA: Diagnosis present

## 2018-11-28 DIAGNOSIS — O321XX0 Maternal care for breech presentation, not applicable or unspecified: Secondary | ICD-10-CM | POA: Diagnosis present

## 2018-11-28 DIAGNOSIS — O99324 Drug use complicating childbirth: Secondary | ICD-10-CM | POA: Diagnosis present

## 2018-11-28 DIAGNOSIS — A599 Trichomoniasis, unspecified: Secondary | ICD-10-CM

## 2018-11-28 DIAGNOSIS — Z3A37 37 weeks gestation of pregnancy: Secondary | ICD-10-CM | POA: Diagnosis not present

## 2018-11-28 DIAGNOSIS — O365930 Maternal care for other known or suspected poor fetal growth, third trimester, not applicable or unspecified: Principal | ICD-10-CM | POA: Diagnosis present

## 2018-11-28 DIAGNOSIS — R059 Cough, unspecified: Secondary | ICD-10-CM

## 2018-11-28 DIAGNOSIS — O99824 Streptococcus B carrier state complicating childbirth: Secondary | ICD-10-CM | POA: Diagnosis present

## 2018-11-28 DIAGNOSIS — D500 Iron deficiency anemia secondary to blood loss (chronic): Secondary | ICD-10-CM | POA: Diagnosis present

## 2018-11-28 DIAGNOSIS — R05 Cough: Secondary | ICD-10-CM

## 2018-11-28 DIAGNOSIS — Z283 Underimmunization status: Secondary | ICD-10-CM

## 2018-11-28 DIAGNOSIS — O0990 Supervision of high risk pregnancy, unspecified, unspecified trimester: Secondary | ICD-10-CM

## 2018-11-28 DIAGNOSIS — O9832 Other infections with a predominantly sexual mode of transmission complicating childbirth: Secondary | ICD-10-CM | POA: Diagnosis present

## 2018-11-28 DIAGNOSIS — O99334 Smoking (tobacco) complicating childbirth: Secondary | ICD-10-CM | POA: Diagnosis present

## 2018-11-28 DIAGNOSIS — Z880 Allergy status to penicillin: Secondary | ICD-10-CM | POA: Diagnosis not present

## 2018-11-28 DIAGNOSIS — R87610 Atypical squamous cells of undetermined significance on cytologic smear of cervix (ASC-US): Secondary | ICD-10-CM

## 2018-11-28 DIAGNOSIS — R87810 Cervical high risk human papillomavirus (HPV) DNA test positive: Secondary | ICD-10-CM

## 2018-11-28 DIAGNOSIS — O09899 Supervision of other high risk pregnancies, unspecified trimester: Secondary | ICD-10-CM

## 2018-11-28 DIAGNOSIS — O9989 Other specified diseases and conditions complicating pregnancy, childbirth and the puerperium: Secondary | ICD-10-CM

## 2018-11-28 LAB — CBC
HCT: 31.8 % — ABNORMAL LOW (ref 36.0–46.0)
Hemoglobin: 9.6 g/dL — ABNORMAL LOW (ref 12.0–15.0)
MCH: 21 pg — ABNORMAL LOW (ref 26.0–34.0)
MCHC: 30.2 g/dL (ref 30.0–36.0)
MCV: 69.6 fL — ABNORMAL LOW (ref 80.0–100.0)
NRBC: 0 % (ref 0.0–0.2)
Platelets: 248 10*3/uL (ref 150–400)
RBC: 4.57 MIL/uL (ref 3.87–5.11)
RDW: 20.1 % — ABNORMAL HIGH (ref 11.5–15.5)
WBC: 5.5 10*3/uL (ref 4.0–10.5)

## 2018-11-28 LAB — RAPID URINE DRUG SCREEN, HOSP PERFORMED
Amphetamines: NOT DETECTED
BENZODIAZEPINES: NOT DETECTED
Barbiturates: NOT DETECTED
COCAINE: POSITIVE — AB
Opiates: NOT DETECTED
Tetrahydrocannabinol: NOT DETECTED

## 2018-11-28 LAB — TYPE AND SCREEN
ABO/RH(D): O POS
Antibody Screen: NEGATIVE

## 2018-11-28 LAB — GROUP B STREP BY PCR: Group B strep by PCR: POSITIVE — AB

## 2018-11-28 LAB — OB RESULTS CONSOLE GBS: GBS: POSITIVE

## 2018-11-28 MED ORDER — OXYTOCIN 40 UNITS IN LACTATED RINGERS INFUSION - SIMPLE MED
INTRAVENOUS | Status: AC
  Filled 2018-11-28: qty 1000

## 2018-11-28 MED ORDER — SODIUM CHLORIDE 0.9 % IV SOLN
5.0000 10*6.[IU] | Freq: Once | INTRAVENOUS | Status: DC
  Filled 2018-11-28: qty 5

## 2018-11-28 MED ORDER — OXYCODONE-ACETAMINOPHEN 5-325 MG PO TABS: 1.0000 | ORAL_TABLET | ORAL | Status: DC | PRN

## 2018-11-28 MED ORDER — FLEET ENEMA 7-19 GM/118ML RE ENEM: 1.0000 | ENEMA | RECTAL | Status: DC | PRN

## 2018-11-28 MED ORDER — TERBUTALINE SULFATE 1 MG/ML IJ SOLN
0.2500 mg | Freq: Once | INTRAMUSCULAR | Status: DC | PRN
  Filled 2018-11-28: qty 1

## 2018-11-28 MED ORDER — PENICILLIN G 3 MILLION UNITS IVPB - SIMPLE MED: 3.0000 10*6.[IU] | INTRAVENOUS | Status: DC

## 2018-11-28 MED ORDER — TERBUTALINE SULFATE 1 MG/ML IJ SOLN
0.2500 mg | Freq: Once | INTRAMUSCULAR | Status: AC
  Administered 2018-11-28: 0.25 mg via SUBCUTANEOUS
  Filled 2018-11-28: qty 1

## 2018-11-28 MED ORDER — SODIUM CHLORIDE 0.9 % IV SOLN: 1.0000 g | Freq: Four times a day (QID) | INTRAVENOUS | Status: DC

## 2018-11-28 MED ORDER — OXYTOCIN BOLUS FROM INFUSION
500.0000 mL | Freq: Once | INTRAVENOUS | Status: AC
  Administered 2018-11-29: 500 mL via INTRAVENOUS

## 2018-11-28 MED ORDER — FENTANYL CITRATE (PF) 100 MCG/2ML IJ SOLN
50.0000 ug | INTRAMUSCULAR | Status: DC | PRN
  Administered 2018-11-29: 100 ug via INTRAVENOUS
  Administered 2018-11-29: 50 ug via INTRAVENOUS
  Filled 2018-11-28 (×2): qty 2

## 2018-11-28 MED ORDER — OXYCODONE-ACETAMINOPHEN 5-325 MG PO TABS: 2.0000 | ORAL_TABLET | ORAL | Status: DC | PRN

## 2018-11-28 MED ORDER — LACTATED RINGERS IV SOLN
INTRAVENOUS | Status: DC
  Administered 2018-11-28 – 2018-11-29 (×3): via INTRAVENOUS

## 2018-11-28 MED ORDER — LACTATED RINGERS AMNIOINFUSION
INTRAVENOUS | Status: DC
  Administered 2018-11-28 – 2018-11-29 (×2): via INTRAUTERINE
  Filled 2018-11-28 (×2): qty 1000

## 2018-11-28 MED ORDER — SOD CITRATE-CITRIC ACID 500-334 MG/5ML PO SOLN
30.0000 mL | ORAL | Status: DC | PRN
  Filled 2018-11-28: qty 15

## 2018-11-28 MED ORDER — ONDANSETRON HCL 4 MG/2ML IJ SOLN: 4.0000 mg | Freq: Four times a day (QID) | INTRAMUSCULAR | Status: DC | PRN

## 2018-11-28 MED ORDER — OXYTOCIN 40 UNITS IN LACTATED RINGERS INFUSION - SIMPLE MED
2.5000 [IU]/h | INTRAVENOUS | Status: DC
  Administered 2018-11-29: 2.5 [IU]/h via INTRAVENOUS

## 2018-11-28 MED ORDER — ACETAMINOPHEN 325 MG PO TABS: 650.0000 mg | ORAL_TABLET | ORAL | Status: DC | PRN

## 2018-11-28 MED ORDER — METRONIDAZOLE 500 MG PO TABS
2000.0000 mg | ORAL_TABLET | Freq: Once | ORAL | Status: AC
  Administered 2018-11-28: 2000 mg via ORAL
  Filled 2018-11-28: qty 4

## 2018-11-28 MED ORDER — LACTATED RINGERS IV SOLN
500.0000 mL | INTRAVENOUS | Status: DC | PRN
  Administered 2018-11-28: 500 mL via INTRAVENOUS

## 2018-11-28 MED ORDER — SODIUM CHLORIDE 0.9 % IV SOLN
2.0000 g | Freq: Four times a day (QID) | INTRAVENOUS | Status: DC
  Administered 2018-11-28 – 2018-11-29 (×3): 2 g via INTRAVENOUS
  Filled 2018-11-28: qty 2
  Filled 2018-11-28 (×2): qty 2000
  Filled 2018-11-28 (×2): qty 2

## 2018-11-28 MED ORDER — OXYTOCIN 40 UNITS IN LACTATED RINGERS INFUSION - SIMPLE MED
1.0000 m[IU]/min | INTRAVENOUS | Status: DC
  Administered 2018-11-28: 2 m[IU]/min via INTRAVENOUS

## 2018-11-28 MED ORDER — LIDOCAINE HCL (PF) 1 % IJ SOLN
30.0000 mL | INTRAMUSCULAR | Status: DC | PRN
  Filled 2018-11-28: qty 30

## 2018-11-28 NOTE — Progress Notes (Signed)
LABOR PROGRESS NOTE  Nyjah C Gaynelle AduMcCain is a 32 y.o. [id number] at 5464w5d  admitted for IOL for IUGR  Subjective: Patient is feeling increasingly uncomfortable with contractions. She is also hungry but is agreeable to not eating while on pitocin.  Objective: BP 106/80   Pulse 88   Temp 98.5 F (36.9 C) (Oral)   Resp 18   Ht 5\' 7"  (1.702 m)   Wt 64.1 kg   LMP 03/09/2018 (Exact Date)   BMI 22.14 kg/m  or  Vitals:   11/28/18 1634 11/28/18 1635 11/28/18 1708 11/28/18 1709  BP: 117/80 117/80 106/80 106/80  Pulse: 79 79 88 88  Resp: 18  18   Temp:      TempSrc:      Weight:      Height:        ~1820 Dilation: 3 Effacement (%): 60 Cervical Position: Anterior Station: -2 Presentation: Vertex Exam by:: Johnson Controlsnderson FHT: baseline rate 140, moderate varibility, 15x15 acel, none decel Toco: 2-3.5 mild  Labs: Lab Results  Component Value Date   WBC 5.5 11/28/2018   HGB 9.6 (L) 11/28/2018   HCT 31.8 (L) 11/28/2018   MCV 69.6 (L) 11/28/2018   PLT 248 11/28/2018    Patient Active Problem List   Diagnosis Date Noted  . Normal labor and delivery 11/28/2018  . ASCUS with positive high risk HPV cervical 11/25/2018  . Rubella non-immune status, antepartum 11/25/2018  . Trichomonas infection 11/25/2018  . Supervision of high risk pregnancy, antepartum 11/11/2018  . Late prenatal care affecting pregnancy, antepartum 11/11/2018  . Iron deficiency anemia due to chronic blood loss 07/04/2018  . Perianal abscess 07/04/2018  . Cocaine abuse complicating pregnancy in second trimester (HCC) 07/04/2018  . Inadequate social support 07/04/2018  . Abdominal pain in pregnancy   . Pregnant 07/03/2018  . History of Cocaine abuse affecting pregnancy in third trimester (HCC) 04/2016 04/27/2016  . Tobacco abuse 06/17/2012    Assessment / Plan: 32 y.o. [id number] at 6864w5d here for IOL for IUGR  Labor: cervix is progressing well on pitocin. At this time, AROM with clear fluids, inserted IUPC. Dilated to  ~3cm, baby is vertex and engaged Fetal Wellbeing:  Category I Pain Control:  None. Patient prefers to bounce on birthing ball and take showers/baths Anticipated MOD:  vaginal  Olamae Ferrara DO 11/28/2018, 6:21 PM

## 2018-11-28 NOTE — H&P (Addendum)
LABOR AND DELIVERY ADMISSION HISTORY AND PHYSICAL NOTE  [name] [name] is a 32 y.o. female [phone] with IUP at 64w5dpresenting for IOL due to IUGR.  She reports positive fetal movement. She denies leakage of fluid or vaginal bleeding.  [facility] Pregnancy complications:  - Tobacco use - Crack cocaine, marijuana, and tobacco abuse, last known usage of cocaine was 12/8 per patient. - Inadequate social support, FOB in prison until 10/2019 - Late prenatal care - ASCUS with positive high risk HPV - Rubella non-immune  Past Medical History: Past Medical History:  Diagnosis Date  . Abscess of axilla   . Anemia   . Anxiety   . Blood transfusion without reported diagnosis   . Depression    bipolar; not on meds  . Headache(784.0)   . Heart murmur   . Hemorrhoids   . Hypokalemia 06/17/2012  . Perianal abscess 12/2017  . Severe major depression ([place] 06/17/2012  . Substance abuse ([facility]   . Suicide attempt ([place] 06/19/2012  . UTI (lower urinary tract infection) 06/19/2012    Past Surgical History: Past Surgical History:  Procedure Laterality Date  . DENTAL SURGERY    . INCISE AND DRAIN ABCESS  01/16/2018  . INCISION AND DRAINAGE PERIRECTAL ABSCESS N/A 01/16/2018   Procedure: IRRIGATION AND DEBRIDEMENT PERIRECTAL ABSCESS;  Surgeon: KKieth Brightly LArta Bruce MD;  [place]  Service: General;  Laterality: N/A;    Obstetrical History: OB History    Gravida  5   Para  3   Term  2   Preterm  1   AB  1   Living  3     SAB  1   TAB  0   Ectopic  0   Multiple  0   Live Births  3           Social History: Social History   Socioeconomic History  . Marital status: Single    Spouse name: Not on file  . Number of children: Not on file  . Years of education: Not on file  . Highest education level: Not on file  Occupational History  . Not on file  Social Needs  . Financial resource strain: Not on file  . Food  insecurity:    Worry: Not on file    Inability: Not on file  . Transportation needs:    Medical: Not on file    Non-medical: Not on file  Tobacco Use  . Smoking status: Light Tobacco Smoker    Packs/day: 0.50    Years: 9.00    Pack years: 4.50    Types: Cigarettes    Last attempt to quit: 01/14/2018    Years since quitting: 0.8  . Smokeless tobacco: Never Used  Substance and Sexual Activity  . Alcohol use: Not Currently    Comment: rare  . Drug use: Not Currently    Frequency: 3.0 times per week    Types: Marijuana, Cocaine  . Sexual activity: Yes    Birth control/protection: None  Lifestyle  . Physical activity:    Days per week: Not on file    Minutes per session: Not on file  . Stress: Not on file  Relationships  . Social connections:    Talks on phone: Not on file    Gets together: Not on file    Attends religious service: Not on file    Active member of club or organization: Not on file  Attends meetings of clubs or organizations: Not on file    Relationship status: Not on file  Other Topics Concern  . Not on file  Social History Narrative  . Not on file    Family History: Family History  Problem Relation Age of Onset  . Hypertension Mother   . Diabetes type II Mother   . Arthritis Mother   . Hyperlipidemia Mother   . Cancer Father        lung    Allergies: Allergies  Allergen Reactions  . Penicillins Nausea And Vomiting    Has patient had a PCN reaction causing immediate rash, facial/tongue/throat swelling, SOB or lightheadedness with hypotension: No Has patient had a PCN reaction causing severe rash involving mucus membranes or skin necrosis: No Has patient had a PCN reaction that required hospitalization Yes Has patient had a PCN reaction occurring within the last 10 years: No If all of the above answers are "NO", then may proceed with Cephalosporin use.     Medications Prior to Admission  Medication Sig Dispense Refill Last Dose  .  acetaminophen (TYLENOL) 500 MG tablet Take 2 tablets (1,000 mg total) by mouth every 6 (six) hours as needed for moderate pain. (Patient not taking: Reported on 11/25/2018) 30 tablet 0 Not Taking  . clindamycin (CLEOCIN) 300 MG capsule Take 1 capsule (300 mg total) by mouth 3 (three) times daily. (Patient not taking: Reported on 11/11/2018) 21 capsule 0 Not Taking  . ferrous sulfate 325 (65 FE) MG tablet Take 1 tablet (325 mg total) by mouth daily. (Patient not taking: Reported on 11/11/2018) 30 tablet 3 Not Taking  . hydrocortisone (ANUSOL-HC) 2.5 % rectal cream Place rectally 2 (two) times daily. (Patient not taking: Reported on 11/11/2018) 30 g 0 Not Taking  . metroNIDAZOLE (FLAGYL) 500 MG tablet Take two tablets by mouth twice a day, for one day.  Or you can take all four tablets at once if you can tolerate it. 4 tablet 0   . Prenatal Vit-Fe Fumarate-FA (PRENATAL MULTIVITAMIN) TABS tablet Take 1 tablet by mouth daily at 12 noon. (Patient not taking: Reported on 11/11/2018) 30 tablet 2 Not Taking     Review of Systems  All systems reviewed and negative except as stated in HPI  Physical Exam Blood pressure 111/69, pulse 78, temperature 97.8 F (36.6 C), temperature source Oral, resp. rate 18, last menstrual period 03/09/2018, unknown if currently breastfeeding. General appearance: alert, oriented, NAD Lungs: normal respiratory effort Heart: regular rate Abdomen: soft, non-tender; gravid, FH appropriate for GA Extremities: No calf swelling or tenderness Presentation: Pilar Plate breech Fetal monitoring: Category 1, baseline HR 135 Uterine activity: Mild contractions q3-5h, 1cm dilation, membranes intact   Prenatal labs: ABO, Rh: O/Positive/-- (11/25 1143) Antibody: Negative (11/25 1143) Rubella: <0.90 (11/25 1143) RPR: Non Reactive (11/25 1143)  HBsAg: Negative (11/25 1143)  HIV: Non Reactive (11/25 1143)  GC/Chlamydia: Negative 11/25/18 GBS:   Positive during previous pregnancy in  2017 1-hr GTT: 73 Genetic screening: Not completed Anatomy US: 11/27/18: Most likely growth restricted based on LMP. BPP 8/8  Prenatal Transfer Tool  Maternal Diabetes: No Genetic Screening: Declined Maternal Ultrasounds/Referrals: Abnormal:  Findings:   IUGR Fetal Ultrasounds or other Referrals:  None Maternal Substance Abuse:  Yes:  Type: Smoker, Marijuana, Cocaine Significant Maternal Medications:  Meds include: Other: Prenatal vitamin Significant Maternal Lab Results: None  No results found for this or any previous visit (from the past 24 hour(s)).  Patient Active Problem List   Diagnosis Date  Noted  . ASCUS with positive high risk HPV cervical 11/25/2018  . Rubella non-immune status, antepartum 11/25/2018  . Trichomonas infection 11/25/2018  . Supervision of high risk pregnancy, antepartum 11/11/2018  . Late prenatal care affecting pregnancy, antepartum 11/11/2018  . Cough 10/27/2018  . Iron deficiency anemia due to chronic blood loss 07/04/2018  . Perianal abscess 07/04/2018  . Cocaine abuse complicating pregnancy in second trimester ([place]) 07/04/2018  . Inadequate social support 07/04/2018  . Abdominal pain in pregnancy   . Pregnant 07/03/2018  . History of Cocaine abuse affecting pregnancy in third trimester ([place]) 04/2016 04/27/2016  . Tobacco abuse 06/17/2012    Assessment: Haille C Mckelvie is a 32 y.o. [id number] at 74w5dhere for IOL due to IUGR.  #Labor: 1 cm dilated, membranes intact. [place], planning for c-section.  #Pain: Does not want epidural in case of VD  #FWB: Category 1, baseline HR 135, 15x15 #ID: Rubella non-immune, plan for MMR post-partum. Positive Trichomonas, s/p treatment with Flagyl 11/26/18. RPR unknown, pending. GBS positive during last pregnancy 2017, rapid GBS pending.  #ASCUS with positive high risk HPV Cervical: Colposcopy post-partum #Cocaine abuse: Social work consulted, appreciate assistance, urinary tox screen pending   #MOF: Breast #MOC: BTL, paperwork signed 11/25 #Circ: UMarcie BalBarefoot MS3 11/28/2018, 11:10 AM   Seen with MS3 Barefoot and agree with above. Bedside u/s done by me and fetus is still transverse: head on materal right (mid belly) and back down with bottom on contralateral side as head. Subjective AFI still looks normal and FHR normal.   D/w patient re: ECV (risks/benefits, including abruption, need for stat c-section) and she states she would like to try for ecv. UDS pos for cocaine, anesthesia okay for regional anesthesia as needed. Pt is rh pos. Will do a dose of terbutaline 150mrior to trial. If not successful, pt aware and okay with c-section. Will touch base with team if okay to do btl with papers signed on 11/25. Will give a dose flagyl for recent trich dx and pt didn't pick up Rx. If ecv successful, will start amp for GBS unknown (pending) but prior + PCR. NPO since 0800 today  ChDurene RomansD Attending Center for [facility]) 11/28/2018 Time: [phone]

## 2018-11-28 NOTE — Procedures (Signed)
External Cephalic Version Procedure Note  Pre-operative Diagnosis: 37/5 weeks. Transverse presentation. FGR indication for delivery.  Post-operative Diagnosis: 37/5 weeks. Vertex presentation. FGR indication for delivery  Procedure Details  Patient ultrasounded fetus had moved to transverse but this time head maternal left and back up and still normal FHR. Patient formally consented for ECV after d/w her re: R/B/A. Over three rolls, the fetus was able to be easily flipped (forward roll) to vertex. After the 2nd roll, fetus had bradycardia for approximately 30 seconds and stayed normal throughout the 3rd roll. After 3rd roll, patient put in semi fowlers and binder placed. SVE 2/50/-2 (ballotable). Will start pitocin  Will come back in 1.5-2 hours and d/w pt that if still cephalic and more applied recommend AROM, in order to make sure fetus doesn't move out of position; patient amenable to plan.   Complications: None   [name] [name], Jr MD Attending Center for [facility](Faculty Practice)

## 2018-11-28 NOTE — Telephone Encounter (Signed)
Called pt to request that she come in today to be induced d/t IUGR.  Explained IUGR and the reason this would lead to the need for induction.  Pt verbalized understanding, expressed concern that baby would be taken away by a social worker right at delivery and that why she had wanted to wait to have a week "clean."  Verbalized understanding of pt's concern but reinforced the importance of pt coming in.  Pt verbalized understanding and reported that she would call for a ride to come to the hospital.

## 2018-11-29 ENCOUNTER — Encounter (HOSPITAL_COMMUNITY): Payer: Self-pay | Admitting: Anesthesiology

## 2018-11-29 ENCOUNTER — Encounter (HOSPITAL_COMMUNITY): Payer: Self-pay

## 2018-11-29 DIAGNOSIS — O365930 Maternal care for other known or suspected poor fetal growth, third trimester, not applicable or unspecified: Secondary | ICD-10-CM

## 2018-11-29 DIAGNOSIS — Z3A37 37 weeks gestation of pregnancy: Secondary | ICD-10-CM

## 2018-11-29 LAB — STREP GP B SUSCEPTIBILITY

## 2018-11-29 LAB — STREP GP B CULTURE+RFLX: STREP GP B CULTURE+RFLX: POSITIVE — AB

## 2018-11-29 LAB — RPR: RPR Ser Ql: NONREACTIVE

## 2018-11-29 MED ORDER — IBUPROFEN 600 MG PO TABS
600.0000 mg | ORAL_TABLET | Freq: Four times a day (QID) | ORAL | Status: DC
  Administered 2018-11-29 – 2018-12-01 (×8): 600 mg via ORAL
  Filled 2018-11-29 (×8): qty 1

## 2018-11-29 MED ORDER — ONDANSETRON HCL 4 MG PO TABS: 4.0000 mg | ORAL_TABLET | ORAL | Status: DC | PRN

## 2018-11-29 MED ORDER — ZOLPIDEM TARTRATE 5 MG PO TABS: 5.0000 mg | ORAL_TABLET | Freq: Every evening | ORAL | Status: DC | PRN

## 2018-11-29 MED ORDER — OXYCODONE HCL 5 MG PO TABS: 5.0000 mg | ORAL_TABLET | ORAL | Status: DC | PRN

## 2018-11-29 MED ORDER — FERROUS SULFATE 325 (65 FE) MG PO TABS
325.0000 mg | ORAL_TABLET | Freq: Two times a day (BID) | ORAL | Status: DC
  Administered 2018-11-29 – 2018-12-01 (×5): 325 mg via ORAL
  Filled 2018-11-29 (×5): qty 1

## 2018-11-29 MED ORDER — DIPHENHYDRAMINE HCL 25 MG PO CAPS: 25.0000 mg | ORAL_CAPSULE | Freq: Four times a day (QID) | ORAL | Status: DC | PRN

## 2018-11-29 MED ORDER — WITCH HAZEL-GLYCERIN EX PADS
1.0000 "application " | MEDICATED_PAD | CUTANEOUS | Status: DC | PRN
  Administered 2018-11-29: 1 via TOPICAL

## 2018-11-29 MED ORDER — SIMETHICONE 80 MG PO CHEW: 80.0000 mg | CHEWABLE_TABLET | ORAL | Status: DC | PRN

## 2018-11-29 MED ORDER — SENNOSIDES-DOCUSATE SODIUM 8.6-50 MG PO TABS
2.0000 | ORAL_TABLET | ORAL | Status: DC
  Administered 2018-11-30 – 2018-12-01 (×2): 2 via ORAL
  Filled 2018-11-29 (×2): qty 2

## 2018-11-29 MED ORDER — BENZOCAINE-MENTHOL 20-0.5 % EX AERO
1.0000 "application " | INHALATION_SPRAY | CUTANEOUS | Status: DC | PRN
  Administered 2018-11-29: 1 via TOPICAL
  Filled 2018-11-29: qty 56

## 2018-11-29 MED ORDER — OXYTOCIN 40 UNITS IN LACTATED RINGERS INFUSION - SIMPLE MED: 2.5000 [IU]/h | INTRAVENOUS | Status: DC | PRN

## 2018-11-29 MED ORDER — PHENYLEPHRINE 40 MCG/ML (10ML) SYRINGE FOR IV PUSH (FOR BLOOD PRESSURE SUPPORT)
80.0000 ug | PREFILLED_SYRINGE | INTRAVENOUS | Status: DC | PRN
  Filled 2018-11-29: qty 10

## 2018-11-29 MED ORDER — MEASLES, MUMPS & RUBELLA VAC IJ SOLR
0.5000 mL | Freq: Once | INTRAMUSCULAR | Status: DC
  Filled 2018-11-29: qty 0.5

## 2018-11-29 MED ORDER — ONDANSETRON HCL 4 MG/2ML IJ SOLN: 4.0000 mg | INTRAMUSCULAR | Status: DC | PRN

## 2018-11-29 MED ORDER — ACETAMINOPHEN 325 MG PO TABS: 650.0000 mg | ORAL_TABLET | ORAL | Status: DC | PRN

## 2018-11-29 MED ORDER — PRENATAL MULTIVITAMIN CH: 1.0000 | ORAL_TABLET | Freq: Every day | ORAL | Status: DC

## 2018-11-29 MED ORDER — OXYCODONE HCL 5 MG PO TABS: 10.0000 mg | ORAL_TABLET | ORAL | Status: DC | PRN

## 2018-11-29 MED ORDER — PRENATAL MULTIVITAMIN CH
1.0000 | ORAL_TABLET | Freq: Every day | ORAL | Status: DC
  Administered 2018-11-29 – 2018-12-01 (×3): 1 via ORAL
  Filled 2018-11-29 (×3): qty 1

## 2018-11-29 MED ORDER — EPHEDRINE 5 MG/ML INJ
10.0000 mg | INTRAVENOUS | Status: DC | PRN
  Filled 2018-11-29: qty 2

## 2018-11-29 MED ORDER — LACTATED RINGERS IV SOLN: 500.0000 mL | Freq: Once | INTRAVENOUS | Status: DC

## 2018-11-29 MED ORDER — NALOXONE HCL 0.4 MG/ML IJ SOLN
INTRAMUSCULAR | Status: AC
  Filled 2018-11-29: qty 1

## 2018-11-29 MED ORDER — COCONUT OIL OIL: 1.0000 "application " | TOPICAL_OIL | Status: DC | PRN

## 2018-11-29 MED ORDER — DIBUCAINE 1 % RE OINT: 1.0000 "application " | TOPICAL_OINTMENT | RECTAL | Status: DC | PRN
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
## 2018-11-29 NOTE — Progress Notes (Signed)
LABOR PROGRESS NOTE  [name] [name] is a 32 y.o. [id number] at 4313w5d  admitted for IOL for IUGR  Subjective: Patient is feeling increasingly uncomfortable with contractions. Having to breath through them. Currently has amnioinfusion with FHR tolerating contractions well since this was started.  Objective: BP (!) 95/49   Pulse (!) 57   Temp 98.5 F (36.9 C) (Oral)   Resp 17   Ht 5\' 7"  (1.702 m)   Wt 64.1 kg   LMP 03/09/2018 (Exact Date)   BMI 22.14 kg/m  or  Vitals:   11/28/18 2223 11/28/18 2231 11/28/18 2302 11/29/18 0006  BP: 101/62 (!) 102/55 (!) 94/47 (!) 95/49  Pulse: 65 (!) 59 61 (!) 57  Resp:   18 17  Temp:      TempSrc:      Weight:      Height:        ~1820 Dilation: 6 Effacement (%): 70 Cervical Position: Anterior Station: -2 Presentation: Vertex Exam by:: Abrina Petz FHT: baseline rate 140, moderate varibility, 15x15 acel, none decel since amnioinfusion Toco: 2-3.5 mild  Labs: Lab Results  Component Value Date   WBC 5.5 11/28/2018   HGB 9.6 (L) 11/28/2018   HCT 31.8 (L) 11/28/2018   MCV 69.6 (L) 11/28/2018   PLT 248 11/28/2018    Patient Active Problem List   Diagnosis Date Noted  . Normal labor and delivery 11/28/2018  . ASCUS with positive high risk HPV cervical 11/25/2018  . Rubella non-immune status, antepartum 11/25/2018  . Trichomonas infection 11/25/2018  . Supervision of high risk pregnancy, antepartum 11/11/2018  . Late prenatal care affecting pregnancy, antepartum 11/11/2018  . Iron deficiency anemia due to chronic blood loss 07/04/2018  . Perianal abscess 07/04/2018  . Cocaine abuse complicating pregnancy in second trimester ([facility]) 07/04/2018  . Inadequate social support 07/04/2018  . Abdominal pain in pregnancy   . Pregnant 07/03/2018  . History of Cocaine abuse affecting pregnancy in third trimester ([facility]) 04/2016 04/27/2016  . Tobacco abuse 06/17/2012    Assessment / Plan: 32 y.o. [id number] at 5913w5d here for IOL for IUGR  Labor:  Continued progress on Pitocin. Fetal Wellbeing:  Category I Pain Control:  None. Patient prefers to bounce on birthing ball and take showers/baths Anticipated MOD:  vaginal  [name] AgresteSarah Asman Adelbert Gaspard, MD PGY-1 Family Medicine Resident 11/29/2018, 12:35 AM

## 2018-11-29 NOTE — Progress Notes (Signed)
CSW acknowledged consult and attempted to see patient to complete assessment. MOB had several visitors who informed CSW that MOB was asleep, CSW agreed to come back at a later time.  CSW will attempt to see patient at a later time.  Celso SickleKimberly Kimyata Milich, LCSWA Clinical Social Worker [facility] Cell#: [phone]

## 2018-11-29 NOTE — Consult Note (Signed)
Neonatology Note:  Attendance at Code Apgar:   Our team responded to a Code Apgar call to room # 174 following NSVD, due to infant with apnea. The requesting physician was Dr. Gerri LinsPeiffer. The mother is a G5P3A1 O pos, GBS unknown, but positive in 2017, with late PNC, Rubella NI, cigarette smoking, and use of crack cocaine and marijuana. GA is 37 5/7 weeks; IOL due to IUGR. ROM occurred 10 hours PTD and the fluid was clear.  At delivery, the baby was a little stunned per report, but went skin to skin, then became apneic. The OB nursing staff in attendance gave vigorous stimulation and a Code Apgar was called. Our team arrived at 9 minutes of life, at which time the baby was apneic with a HR < 100. I applied PPV for about 1 minute with good response in HR, but baby continued to have irregular respirations. Additional history was obtained that mother had gotten 2 doses of IV Fentanyl about 1-1.5 hours before delivery. We gave Narcan 0.6 ml to the left anterior thigh and baby began to cry and breathe regularly. Apgar scores to be [facility] staff as we were not present. Baby's approximate weight is 2350 grams. I spoke with the mother in the DR, informing her of the risk for hypoglycemia and hypothermia in this small infant, and recommended frequent feeding and use of supplemental formula as needed. Transferred the baby to the Pediatrician's care.   Doretha Souhristie C. Alanee Ting, MD

## 2018-11-29 NOTE — Discharge Summary (Signed)
Postpartum Discharge Summary     Patient Name: [name] [name] DOB: 06/23/1986 MRN: [id number]  Date of admission: 11/28/2018 Delivering Provider: Penny PiaPEIFFER, SARAH A   Date of discharge: 12/01/2018  Admitting diagnosis: 37wks induction Intrauterine pregnancy: 478w6d     Secondary diagnosis:  Active Problems:   Normal labor and delivery  Additional problems:  Limited PNC +Cocaine UDS Recent Trichomonas Infection s/p metronidazole Malpresentation s/p ECV     Discharge diagnosis: Term Pregnancy Delivered                                                                                                Post partum procedures:None  Augmentation: AROM and Pitocin  Complications: None  Hospital course:  Induction of Labor With Vaginal Delivery   32 y.o. yo [phone] at 808w6d was admitted to the hospital 11/28/2018 for induction of labor.  Indication for induction: IOL.  Patient had an uncomplicated labor course as follows: Membrane Rupture Time/Date: 6:05 PM ,11/28/2018   Intrapartum Procedures: Episiotomy: None [1]                                         Lacerations:  None [1]  Patient had delivery of a Viable infant.  Information for the patient's newborn:  Luvenia HellerMcCain, Boy Devita [[id number]][[id number]]  Delivery Method: Vag-Spont   11/29/2018  Details of delivery can be found in separate delivery note.  Patient had a routine postpartum course. Patient is discharged home 12/01/18.  Magnesium Sulfate recieved: No BMZ received: No  Physical exam  Vitals:   11/30/18 0505 11/30/18 1514 11/30/18 2050 12/01/18 0602  BP: 100/61 105/62 (!) 97/51 107/72  Pulse: 66 67 69 66  Resp: 18 16 18 18   Temp: 97.9 F (36.6 C) 98 F (36.7 C) 98 F (36.7 C) 98.3 F (36.8 C)  TempSrc: Oral Oral Oral Oral  SpO2: 98%   99%  Weight:      Height:       General: alert, cooperative and no distress Lochia: appropriate Uterine Fundus: firm Incision: N/A DVT Evaluation: No evidence of DVT seen on physical  exam. Negative Homan's sign. No cords or calf tenderness. No significant calf/ankle edema. Labs: Lab Results  Component Value Date   WBC 5.5 11/28/2018   HGB 9.6 (L) 11/28/2018   HCT 31.8 (L) 11/28/2018   MCV 69.6 (L) 11/28/2018   PLT 248 11/28/2018   CMP Latest Ref Rng & Units 07/04/2018  Glucose 70 - 99 mg/dL 91  BUN 6 - 20 mg/dL 7  Creatinine 1.300.44 - 8.651.00 mg/dL 7.840.48  Sodium 696135 - 295145 mmol/L 138  Potassium 3.5 - 5.1 mmol/L 3.9  Chloride 98 - 111 mmol/L 110  CO2 22 - 32 mmol/L 23  Calcium 8.9 - 10.3 mg/dL 2.8(U8.8(L)  Total Protein 6.5 - 8.1 g/dL -  Total Bilirubin 0.3 - 1.2 mg/dL -  Alkaline Phos 38 - 132126 U/L -  AST 15 - 41 U/L -  ALT 14 - 54 U/L -    Discharge  instruction: per After Visit Summary and "Baby and Me Booklet".  After visit meds:  Allergies as of 12/01/2018      Reactions   Penicillins Nausea And Vomiting   Has patient had a PCN reaction causing immediate rash, facial/tongue/throat swelling, SOB or lightheadedness with hypotension: No Has patient had a PCN reaction causing severe rash involving mucus membranes or skin necrosis: No Has patient had a PCN reaction that required hospitalization Yes Has patient had a PCN reaction occurring within the last 10 years: No If all of the above answers are "NO", then may proceed with Cephalosporin use.      Medication List    STOP taking these medications   clindamycin 300 MG capsule Commonly known as:  CLEOCIN   metroNIDAZOLE 500 MG tablet Commonly known as:  FLAGYL     TAKE these medications   acetaminophen 500 MG tablet Commonly known as:  TYLENOL Take 2 tablets (1,000 mg total) by mouth every 6 (six) hours as needed for moderate pain.   ferrous sulfate 325 (65 FE) MG tablet Take 1 tablet (325 mg total) by mouth daily.   hydrocortisone 2.5 % rectal cream Commonly known as:  ANUSOL-HC Place rectally 2 (two) times daily.   ibuprofen 600 MG tablet Commonly known as:  ADVIL,MOTRIN Take 1 tablet (600 mg  total) by mouth every 6 (six) hours.   prenatal multivitamin Tabs tablet Take 1 tablet by mouth daily at 12 noon.   senna-docusate 8.6-50 MG tablet Commonly known as:  Senokot-S Take 2 tablets by mouth daily for 7 days.       Diet: routine diet  Activity: Advance as tolerated. Pelvic rest for 6 weeks.   Outpatient follow up:4 weeks Follow up Appt: No future appointments. Follow up Visit:   Please schedule this patient for Postpartum visit in: 4 weeks with the following provider: Any provider For C/S patients schedule nurse incision check in weeks 2 weeks: no High risk pregnancy complicated by: Cocaine use, limited PNC Delivery mode:  SVD Anticipated Birth Control:  Plans Interval BTL (signed 11/25) PP Procedures needed: Colpo  Schedule Integrated BH visit: no   Newborn Data: Live born female  Birth Weight:  5lb 2.7oz APGAR: 8, 4, 7   Newborn Delivery   Birth date/time:  11/29/2018 04:00:00 Delivery type:  Vaginal, Spontaneous     Baby Feeding: Breast Disposition:home with mother   12/01/2018 Gwenevere Abbot, MD

## 2018-11-29 NOTE — Anesthesia Preprocedure Evaluation (Deleted)
Anesthesia Evaluation    Airway        Dental   Pulmonary Current Smoker,           Cardiovascular Exercise Tolerance: Good      Neuro/Psych Anxiety Depression Bipolar Disorder    GI/Hepatic (+)     substance abuse  cocaine use,   Endo/Other    Renal/GU      Musculoskeletal   Abdominal   Peds  Hematology  (+) anemia ,   Anesthesia Other Findings   Reproductive/Obstetrics (+) Pregnancy                             Lab Results  Component Value Date   WBC 5.5 11/28/2018   HGB 9.6 (L) 11/28/2018   HCT 31.8 (L) 11/28/2018   MCV 69.6 (L) 11/28/2018   PLT 248 11/28/2018    Anesthesia Physical Anesthesia Plan  ASA: III  Anesthesia Plan: Epidural   Post-op Pain Management:    Induction:   PONV Risk Score and Plan:   Airway Management Planned:   Additional Equipment:   Intra-op Plan:   Post-operative Plan:   Informed Consent: I have reviewed the patients History and Physical, chart, labs and discussed the procedure including the risks, benefits and alternatives for the proposed anesthesia with the patient or authorized representative who has indicated his/her understanding and acceptance.     Plan Discussed with:   Anesthesia Plan Comments: (IUGR, Crack cocaine use  Last 12/8, HPV, )        Anesthesia Quick Evaluation

## 2018-11-29 NOTE — Progress Notes (Signed)
LABOR PROGRESS NOTE  [name] [name] is a 32 y.o. [id number] at 1668w5d  admitted for IOL for IUGR  Subjective: Continuing to feel uncomfortable with some relief after fentanyl. Requesting frequent checks and desiring higher doses of Pitocin due to frustration with labor progress.  Objective: BP 110/73   Pulse 69   Temp 98.2 F (36.8 C) (Oral)   Resp 16   Ht 5\' 7"  (1.702 m)   Wt 64.1 kg   LMP 03/09/2018 (Exact Date)   BMI 22.14 kg/m  or  Vitals:   11/29/18 0201 11/29/18 0226 11/29/18 0231 11/29/18 0301  BP: 112/71  109/73 110/73  Pulse: 67  62 69  Resp: 20  16   Temp:  98.2 F (36.8 C)    TempSrc:  Oral    Weight:      Height:       Dilation: 8 Effacement (%): 70 Cervical Position: Anterior Station: -2 Presentation: Vertex Exam by:: [name] [name] FHT: baseline rate 140, moderate varibility, 15x15 acel, variable decels present Toco: 2-3.5, failed IUPC, now depending on external monitor.  Labs: Lab Results  Component Value Date   WBC 5.5 11/28/2018   HGB 9.6 (L) 11/28/2018   HCT 31.8 (L) 11/28/2018   MCV 69.6 (L) 11/28/2018   PLT 248 11/28/2018    Patient Active Problem List   Diagnosis Date Noted  . Normal labor and delivery 11/28/2018  . ASCUS with positive high risk HPV cervical 11/25/2018  . Rubella non-immune status, antepartum 11/25/2018  . Trichomonas infection 11/25/2018  . Supervision of high risk pregnancy, antepartum 11/11/2018  . Late prenatal care affecting pregnancy, antepartum 11/11/2018  . Iron deficiency anemia due to chronic blood loss 07/04/2018  . Perianal abscess 07/04/2018  . Cocaine abuse complicating pregnancy in second trimester (HCC) 07/04/2018  . Inadequate social support 07/04/2018  . Abdominal pain in pregnancy   . Pregnant 07/03/2018  . History of Cocaine abuse affecting pregnancy in third trimester (HCC) 04/2016 04/27/2016  . Tobacco abuse 06/17/2012    Assessment / Plan: 32 y.o. [id number] at 3968w5d here for IOL for IUGR  Labor:  Continued progress on Pitocin. Fetal Wellbeing:  Category I Pain Control:  Fentanyl PRN. Anticipated MOD:  vaginal  [name] AgresteSarah [name] [name] Faciane, MD PGY-1 Family Medicine Resident 11/29/2018, 3:22 AM

## 2018-11-30 NOTE — Plan of Care (Signed)
  Problem: Education: Goal: Knowledge of condition will improve Note:  Patient comfortable with baby care and feeding baby. Patient only complains of intermittent pain in left upper leg and lower right abdomen; not currently. Patient states she feels that she may have pulled some muscles during delivery; however, she has no problem ambulating. Discussed with patient her prn pain control medications and encouraged her to call as needed. Earl Galasborne, Linda HedgesStefanie Battle Lake[place]udspeth

## 2018-11-30 NOTE — Progress Notes (Signed)
CPS report made yesterday. Infant's drug screen now back and positive for cocaine. CSW called to [place] CPS. Provided update to Charles Key, after hours worker regarding infant's screen. CPS to follow up. CPS safety plan needed prior to discharge.    [name] Dung Barrett-Hilton, LCSW [phone]  

## 2018-11-30 NOTE — Progress Notes (Addendum)
Post Partum Day 1 Subjective: no complaints, voiding, tolerating PO and reports feeling well, intention to "detox" for at least a week before considering breastfeeding but overall not intending to breastfeed.   Objective: Blood pressure 100/61, pulse 66, temperature 97.9 F (36.6 C), temperature source Oral, resp. rate 18, height 5\' 7"  (1.702 m), weight 64.1 kg, last menstrual period 03/09/2018, SpO2 98 %, unknown if currently breastfeeding.  Physical Exam:  General: alert, cooperative and no distress Lochia: appropriate Uterine Fundus: firm Incision: NA DVT Evaluation: No evidence of DVT seen on physical exam. Negative Homan's sign. No cords or calf tenderness. No significant calf/ankle edema.  Recent Labs    11/28/18 1128  HGB 9.6*  HCT 31.8*    Assessment/Plan: Plan for discharge tomorrow, Social Work consult and Contraception interval BTL planned, consents signed 11/25   LOS: 2 days   [name] [name] 11/30/2018, 7:36 AM

## 2018-12-01 MED ORDER — IBUPROFEN 600 MG PO TABS: 600.0000 mg | ORAL_TABLET | Freq: Four times a day (QID) | ORAL | 0 refills | Status: DC

## 2018-12-01 MED ORDER — SENNOSIDES-DOCUSATE SODIUM 8.6-50 MG PO TABS: 2.0000 | ORAL_TABLET | ORAL | 0 refills | Status: AC

## 2018-12-01 NOTE — Progress Notes (Signed)
CPS worker spoke with CPS worker [employer] Key via telephone.  Per CPS, there are no barriers with infant discharging MOB. CPS reports that MOB and children [facility] whom is the key support person for family.  CPS will continue to offer resources and supports to family after discharge.   Blaine HamperAngel Boyd-Gilyard, MSW, LCSW Clinical Social Work [phone]

## 2018-12-04 ENCOUNTER — Encounter: Payer: Self-pay | Admitting: Obstetrics and Gynecology

## 2018-12-09 ENCOUNTER — Telehealth: Payer: Self-pay | Admitting: Obstetrics & Gynecology

## 2018-12-09 NOTE — Telephone Encounter (Addendum)
Pt returned Dr. Ellin Sabaove's call. I advised pt that I did not know why Dr. Marice Potterove was calling however I will send a message that she returned the call. Pt stated that a detailed message can be left on her voicemail if she does not answer. [phone]  Call routed to Dr. Marice Potterove.   12/23  1355  Per chart review, pt was called by registrar Alcario DroughtErica from our office regarding a future appt and not by Dr. Marice Potterove. I called pt back and left a detailed message stating she has office appt on 12/26 @ 0915 in order to have her surgery for BTS scheduled.

## 2018-12-12 ENCOUNTER — Ambulatory Visit: Payer: Medicaid Other | Admitting: Obstetrics & Gynecology

## 2018-12-12 ENCOUNTER — Encounter (HOSPITAL_COMMUNITY): Payer: Self-pay

## 2018-12-12 LAB — CBC
Hematocrit: 35 % (ref 34.0–46.6)
Hemoglobin: 10.2 g/dL — ABNORMAL LOW (ref 11.1–15.9)
MCH: 20.4 pg — ABNORMAL LOW (ref 26.6–33.0)
MCHC: 29.1 g/dL — ABNORMAL LOW (ref 31.5–35.7)
MCV: 70 fL — ABNORMAL LOW (ref 79–97)
PLATELETS: 435 10*3/uL (ref 150–450)
RBC: 4.99 x10E6/uL (ref 3.77–5.28)
RDW: 20.1 % — AB (ref 12.3–15.4)
WBC: 5 10*3/uL (ref 3.4–10.8)

## 2018-12-12 MED ORDER — IBUPROFEN 800 MG PO TABS: 800.0000 mg | ORAL_TABLET | Freq: Three times a day (TID) | ORAL | 1 refills | Status: DC | PRN

## 2018-12-12 NOTE — Progress Notes (Signed)
   Subjective:    Patient ID: [name] [name], female    DOB: 10/10/1986, 32 y.o.   MRN: [id number]  HPI  32 yo single P4 here to schedule a BTL. Her current partner is incarcerated and will be there for months more. She is sure that she doesn't want more kids. She denies and depression symtoms.   Review of Systems     Objective:   Physical Exam Breathing, conversing, and ambulating normally Well nourished, well hydrated Black female, no apparent distress       Assessment & Plan:  Headaches- IBU 800 mg  Prescribed I sent [name] [name] a message to schedule a lap BTL PP bleeding- check CBC Postpartum exam in 4 weeks for pp exam and colpo

## 2018-12-12 NOTE — Progress Notes (Signed)
Pressure when voiding, Migraine on right side of head. Still Bleeding large clots. Wants to discuss cervical polyp, and BTL.

## 2018-12-23 ENCOUNTER — Ambulatory Visit: Payer: Self-pay | Admitting: Obstetrics and Gynecology

## 2019-01-02 ENCOUNTER — Ambulatory Visit: Payer: Self-pay | Admitting: Obstetrics and Gynecology

## 2019-01-09 ENCOUNTER — Ambulatory Visit (INDEPENDENT_AMBULATORY_CARE_PROVIDER_SITE_OTHER): Payer: Medicaid Other | Admitting: Obstetrics and Gynecology

## 2019-01-09 ENCOUNTER — Encounter: Payer: Self-pay | Admitting: Obstetrics and Gynecology

## 2019-01-09 ENCOUNTER — Other Ambulatory Visit (HOSPITAL_COMMUNITY)
Admission: RE | Admit: 2019-01-09 | Discharge: 2019-01-09 | Disposition: A | Discharge status: Home / Self Care | Payer: Medicaid Other | Source: Ambulatory Visit | Attending: Obstetrics & Gynecology | Admitting: Obstetrics & Gynecology

## 2019-01-09 ENCOUNTER — Ambulatory Visit (INDEPENDENT_AMBULATORY_CARE_PROVIDER_SITE_OTHER): Payer: Self-pay | Admitting: Clinical

## 2019-01-09 VITALS — BP 110/76 | HR 67 | Ht 67.0 in | Wt 120.7 lb

## 2019-01-09 DIAGNOSIS — Z1389 Encounter for screening for other disorder: Secondary | ICD-10-CM

## 2019-01-09 DIAGNOSIS — A599 Trichomoniasis, unspecified: Secondary | ICD-10-CM | POA: Insufficient documentation

## 2019-01-09 DIAGNOSIS — Z3042 Encounter for surveillance of injectable contraceptive: Secondary | ICD-10-CM

## 2019-01-09 DIAGNOSIS — Z3202 Encounter for pregnancy test, result negative: Secondary | ICD-10-CM | POA: Diagnosis not present

## 2019-01-09 DIAGNOSIS — F1990 Other psychoactive substance use, unspecified, uncomplicated: Secondary | ICD-10-CM

## 2019-01-09 DIAGNOSIS — F39 Unspecified mood [affective] disorder: Secondary | ICD-10-CM

## 2019-01-09 LAB — POCT PREGNANCY, URINE: Preg Test, Ur: NEGATIVE

## 2019-01-09 MED ORDER — MEDROXYPROGESTERONE ACETATE 150 MG/ML IM SUSP
150.0000 mg | Freq: Once | INTRAMUSCULAR | Status: AC
  Administered 2019-01-09: 150 mg via INTRAMUSCULAR

## 2019-01-09 NOTE — Addendum Note (Signed)
Addended by: Osvaldo Human on: 01/09/2019 10:53 AM   Modules accepted: Orders

## 2019-01-09 NOTE — Patient Instructions (Signed)

## 2019-01-09 NOTE — BH Specialist Note (Signed)
Integrated Behavioral Health Follow Up Visit  MRN: [id number] Name: [name] [name]  Number of Integrated Behavioral Health Clinician visits: 2/6 Session Start time: 11:30 Session End time: 12:13 Total time: 50 minutes  Type of Service: Integrated Behavioral Health- Individual/Family Interpretor:No. Interpretor Name and Language: n/a  SUBJECTIVE: [name] [name] is a 33 y.o. female accompanied by n/a Patient was referred by [name] Elm, MD for SI in past week. Patient reports the following symptoms/concerns: Pt states her primary concern today is having SI, Wants medication that she took years ago that made her "the perfect amount of sleepy, happy, and hungry", but cannot recall the name of the medication.  Duration of problem: Pt has experienced SI off and on since childhood; Severity of problem: severe  OBJECTIVE: Mood: Anxious, Hopeless and Irritable and Affect: Inappropriate Risk of harm to self or others: Suicide plan "If I did it, I'd take a bunch of pills"  LIFE CONTEXT: Family and Social: Pt lives with her mother and 4 children (15,12,2,6weeks) School/Work: - Self-Care: Marijuana preferred method of coping Life Changes: Recent childbirth  GOALS ADDRESSED: Patient will: 1.  Reduce symptoms of: mood instability  2.  Demonstrate ability to: Increase motivation to adhere to plan of care  INTERVENTIONS: Interventions utilized:  Motivational Interviewing Standardized Assessments completed: C-SSRS Short  ASSESSMENT: Patient currently experiencing Substance use disorder, and Mood disorder, unspecified.  Patient may benefit from contract for safety today; brief therapeutic interventions regarding coping with symptoms of anxiety, stress, past trauma, and depression.  PLAN: 1. Follow up with behavioral health clinician on : One week 2. Behavioral recommendations:  -Go to ED today, for medical clearance and voluntary commitment. Pt's mother agrees to keep children, via phone.   -Follow Safety plan 3. Referral(s): Integrated Art gallery manager (In Clinic) and [facility] (LME/Outside Clinic) 4. "From scale of 1-10, how likely are you to follow plan?": -  Rae Lips, LCSW  Depression screen [facility] 2/9 11/25/2018 11/11/2018  Decreased Interest 2 1  Down, Depressed, Hopeless 1 1  PHQ - 2 Score 3 2  Altered sleeping 1 1  Tired, decreased energy 2 2  Change in appetite 0 1  Feeling bad or failure about yourself  1 1  Trouble concentrating 1 0  Moving slowly or fidgety/restless 0 0  Suicidal thoughts 0 0  PHQ-9 Score 8 7   GAD 7 : Generalized Anxiety Score 11/25/2018 11/11/2018  Nervous, Anxious, on Edge 1 2  Control/stop worrying 1 0  Worry too much - different things 1 1  Trouble relaxing 2 1  Restless 1 2  Easily annoyed or irritable 3 2  Afraid - awful might happen 1 1  Total GAD 7 Score 10 9

## 2019-01-09 NOTE — Progress Notes (Signed)
Subjective:     [name] [name] is a 33 y.o. female who presents for a postpartum visit. She is 5 weeks postpartum following a spontaneous vaginal delivery. I have fully reviewed the prenatal and intrapartum course. The delivery was at 7647w6d gestational weeks. Outcome: spontaneous vaginal delivery. Anesthesia: IV sedation. Postpartum course has been uncomplicated. Baby's course has been uncomplicated. Baby is feeding by bottle - Similac Advance. Bleeding thin lochia. Bowel function is normal. Bladder function is normal. Patient is not sexually active. Contraception method is tubal ligation. Postpartum depression screening: positive.  The following portions of the patient's history were reviewed and updated as appropriate: allergies, current medications, past family history, past medical history, past social history and past surgical history.  Review of Systems Pertinent items are noted in HPI.   Objective:    There were no vitals taken for this visit.  General:  alert   Breasts:  not evaluated  Lungs: clear to auscultation bilaterally  Heart:  regular rate and rhythm, S1, S2 normal, no murmur, click, rub or gallop  Abdomen: soft, non-tender; bowel sounds normal; no masses,  no organomegaly   Vulva:  normal  Vagina: normal vagina lochia   Cervix:  no lesions  Corpus: normal size, contour, position, consistency, mobility, non-tender  Adnexa:  normal adnexa  Rectal Exam: Not performed. small sebaceous cyst note on perineum        Assessment:     NL postpartum exam.   Plan:    1. Contraception: Depo-Provera injections today, for BTL on 02/05/19 2. Return to nl ADL's 3. TOC for trich today 4. Colpo delayed d/t to lochia/menses 5.F/U with PP visit

## 2019-01-10 LAB — CERVICOVAGINAL ANCILLARY ONLY
BACTERIAL VAGINITIS: NEGATIVE
Candida vaginitis: NEGATIVE
Chlamydia: NEGATIVE
Neisseria Gonorrhea: NEGATIVE
Trichomonas: NEGATIVE

## 2019-01-13 ENCOUNTER — Telehealth: Payer: Self-pay | Admitting: Clinical

## 2019-01-13 NOTE — Telephone Encounter (Signed)
Follow-up mood check: Pt says she did not go to ED or [facility] for voluntary commitment, as she has court today and tomorrow, and did not want to miss court dates, while inpatient at [facility]. Pt agrees that if her court case is continued tomorrow, she will present to ED for medical clearance, and will re-establish with [facility] for ongoing behavioral health care. Pt has arranged for family to care for children, if it is determined that she needs to stay at [facility] after voluntary commitment. Pt states no current SI today, as she is focused on court proceedings.

## 2019-01-30 ENCOUNTER — Other Ambulatory Visit: Payer: Self-pay

## 2019-01-30 ENCOUNTER — Encounter (HOSPITAL_BASED_OUTPATIENT_CLINIC_OR_DEPARTMENT_OTHER): Payer: Self-pay | Admitting: *Deleted

## 2019-02-01 IMAGING — CR DG CHEST 2V
3 series · 3 of 3 positions shown · non-contrast
Comparison: Radiographs October 26, 2017.

CLINICAL DATA: Productive cough.

EXAM:
CHEST  2 VIEW

[chest lat]
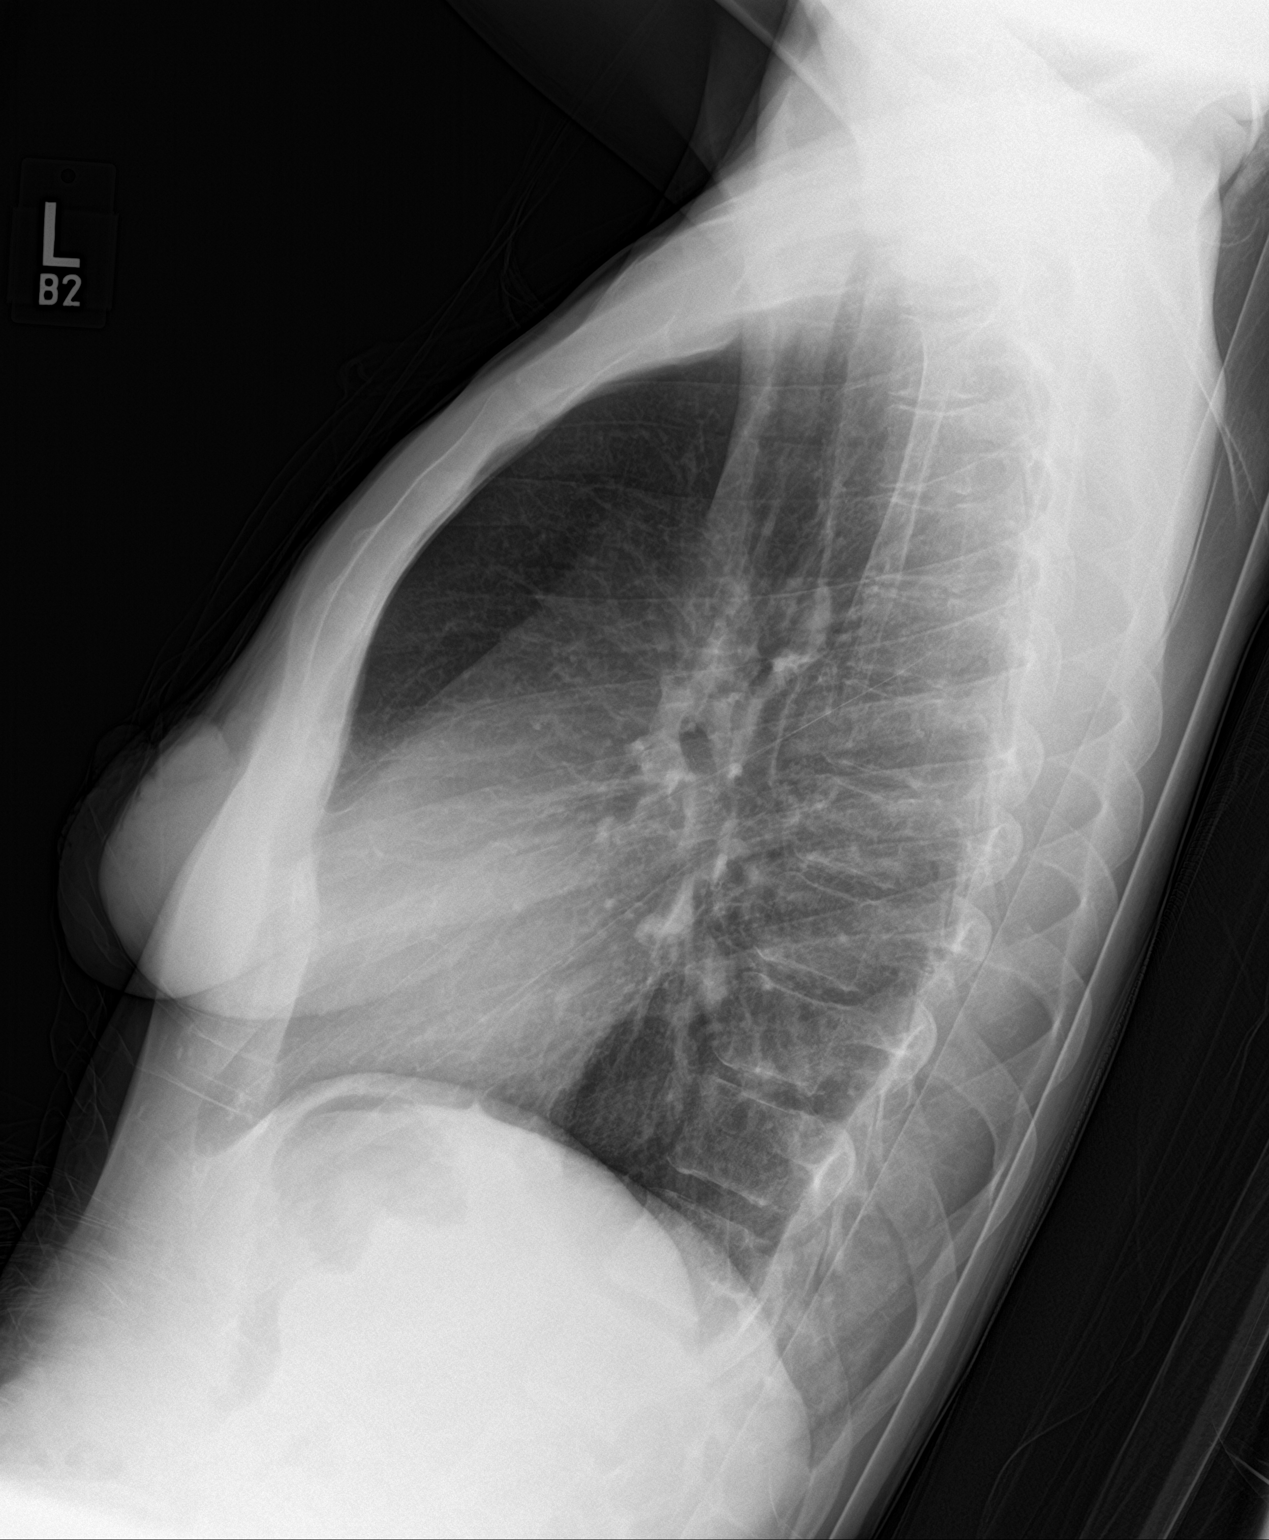

[chest ap (1 of 2)]
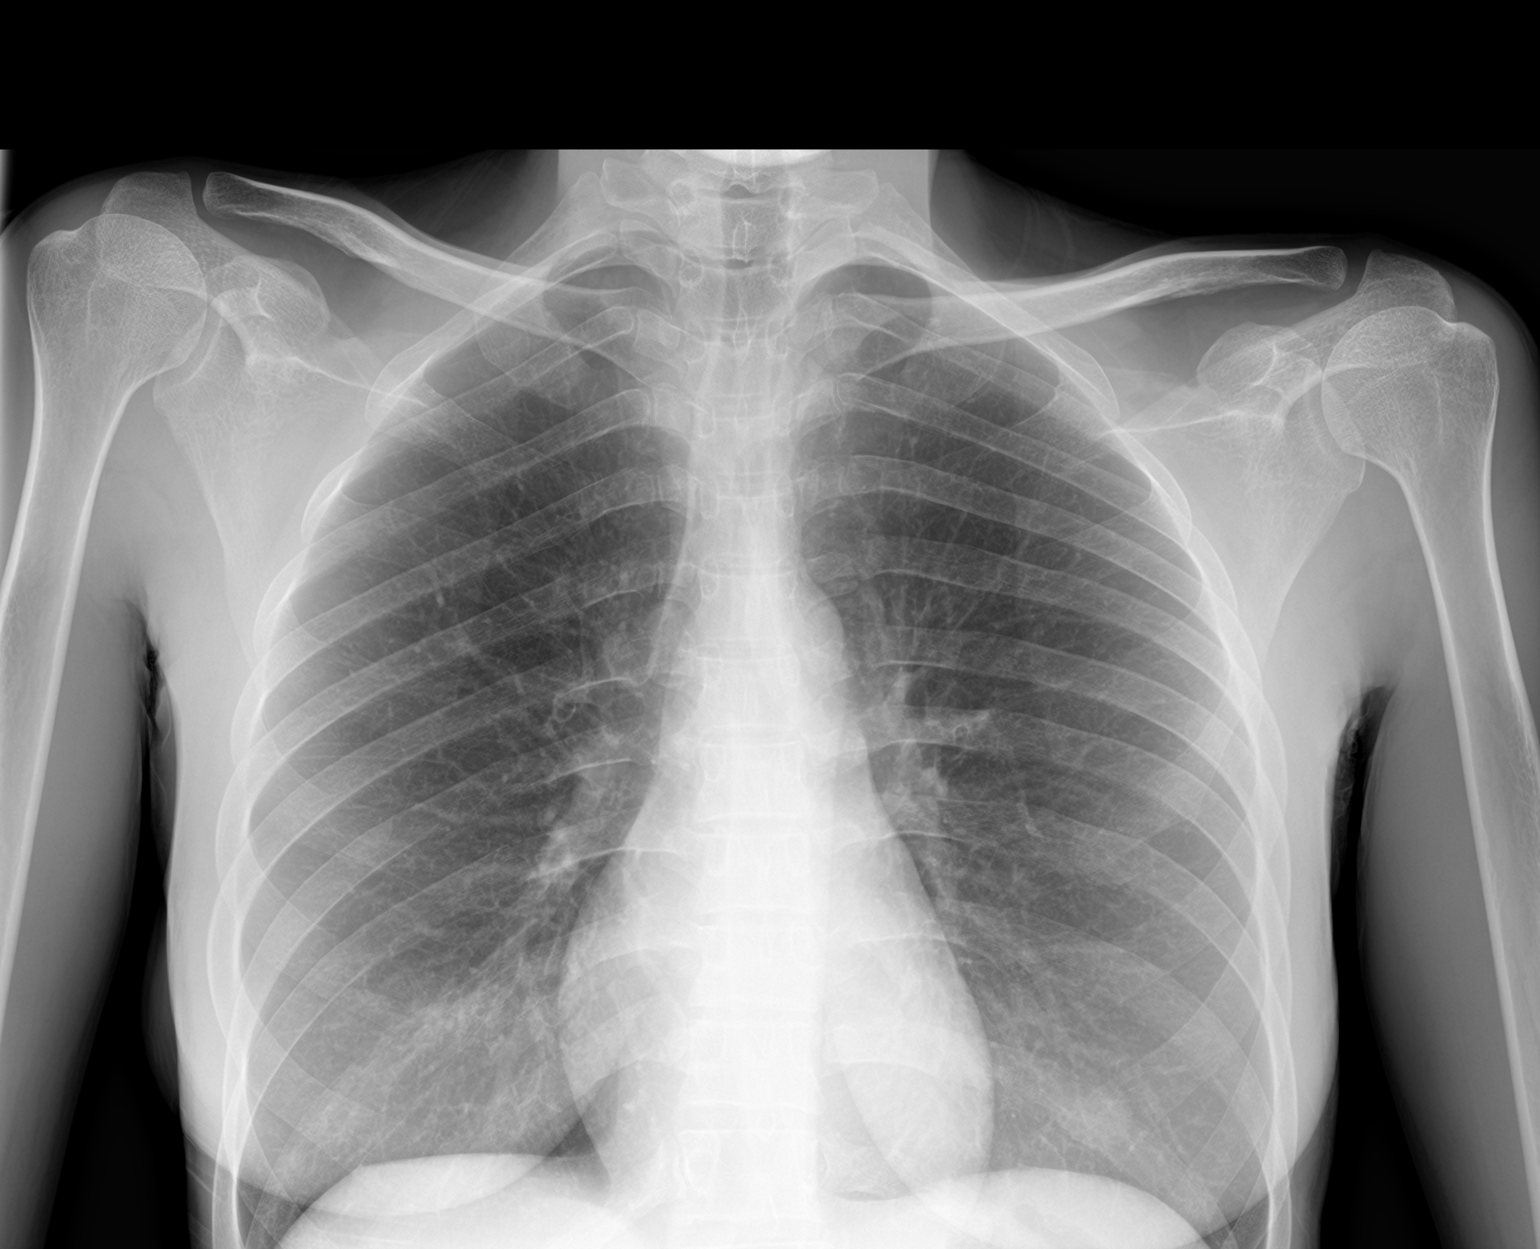

[chest ap (2 of 2)]
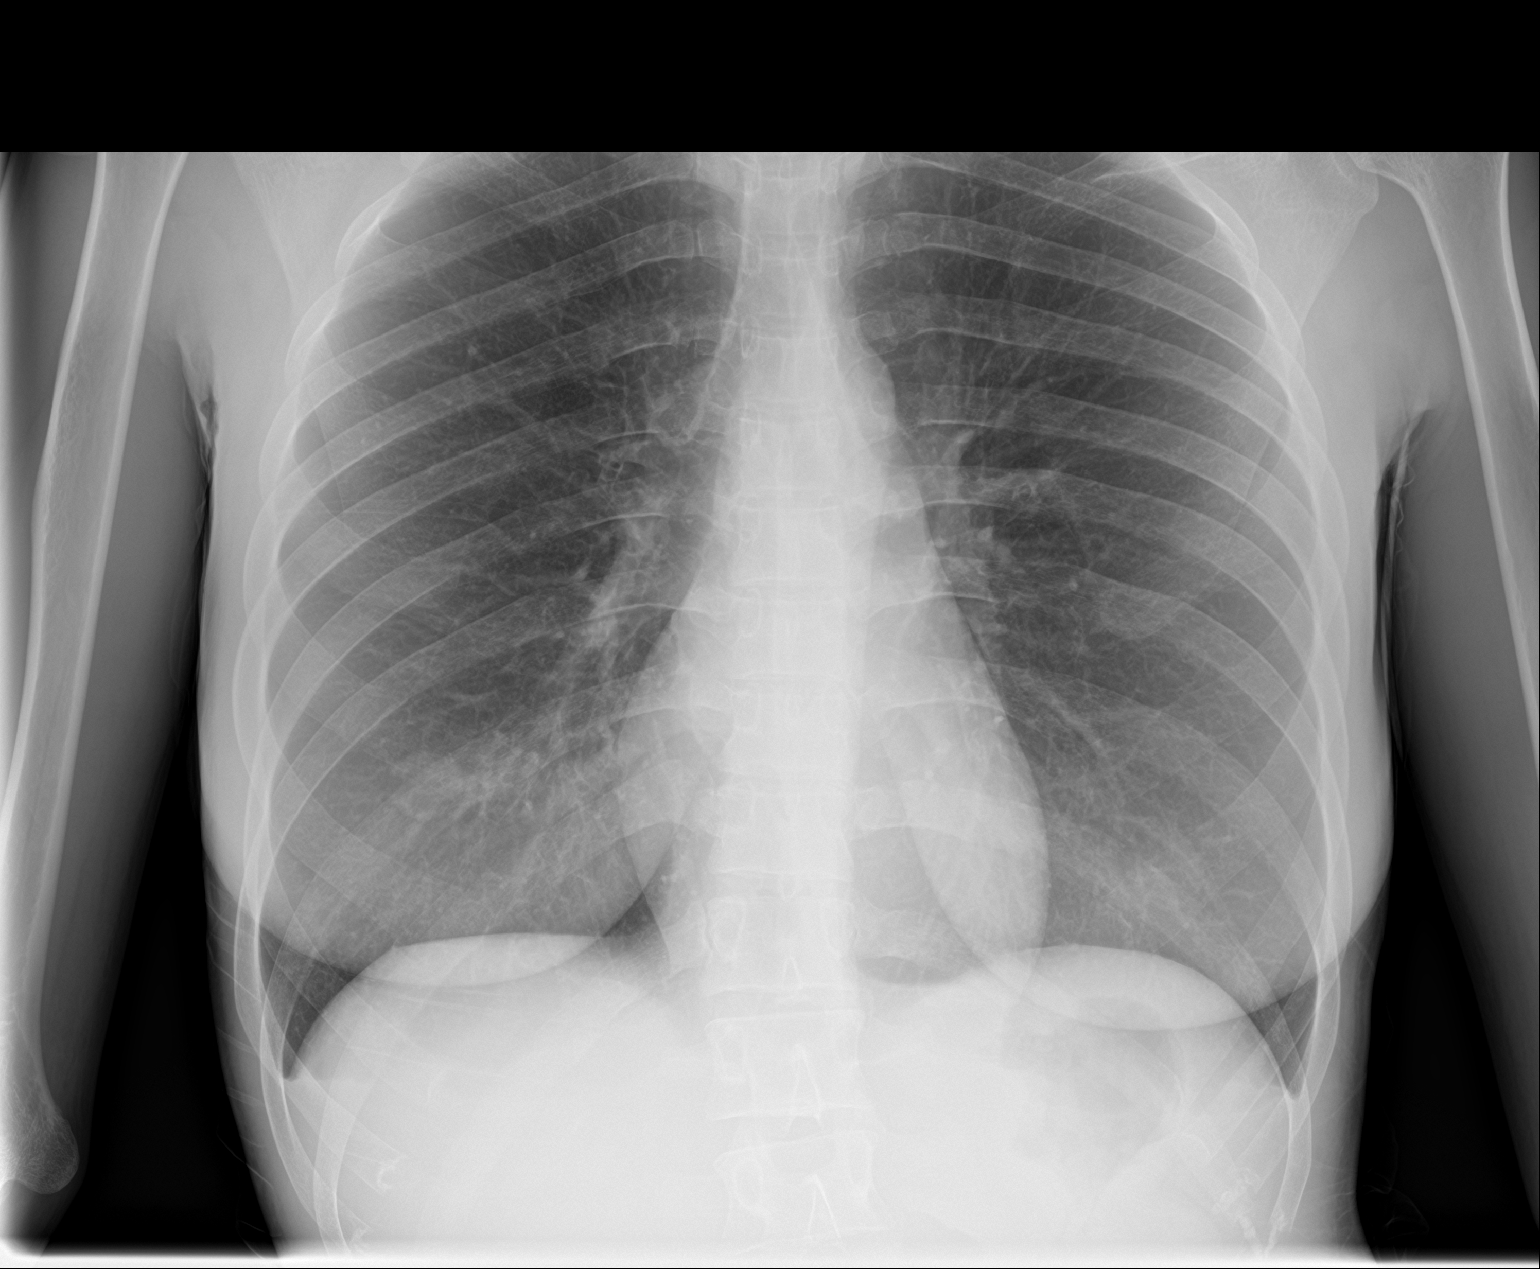

[3 of 3 positions shown; findings below may reference images not displayed]

FINDINGS: The heart size and mediastinal contours are within normal limits.
Both lungs are clear. No pneumothorax or pleural effusion is noted.
The visualized skeletal structures are unremarkable.
IMPRESSION: No active cardiopulmonary disease.

## 2019-02-01 IMAGING — CT CT PELVIS W/ CM
2 of 6 series · 14 of 46 positions shown, 19 images · IV contrast (iopamidol)
Comparison: 12/12/2005

CLINICAL DATA: Pt has a known "knot" between anus and vagina, on
floor. This has been there approximately 1 year, but is growing, and
pain is much worse.

EXAM:
CT PELVIS WITH CONTRAST
TECHNIQUE: Multidetector CT imaging of the pelvis was performed using the
standard protocol following the bolus administration of intravenous
contrast.
CONTRAST:  <See Chart> GMHEGU-4JJ IOPAMIDOL (GMHEGU-4JJ) INJECTION
61%

[Series 3: pelvis with 5.0 · axial · 0.76mm/px · z∈[+808,+1028]mm · 11 of 52 slices shown, 16 images]
[im 4/52  soft-tissue]
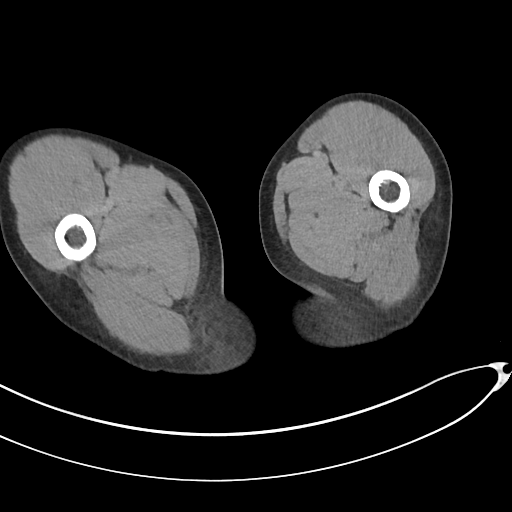
[im 4/52  bone]
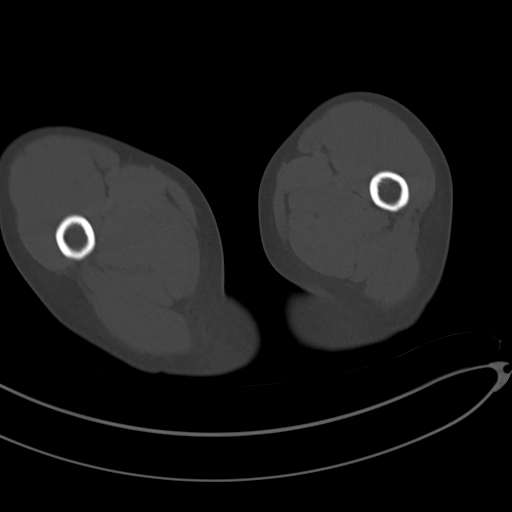
[im 11/52  soft-tissue]
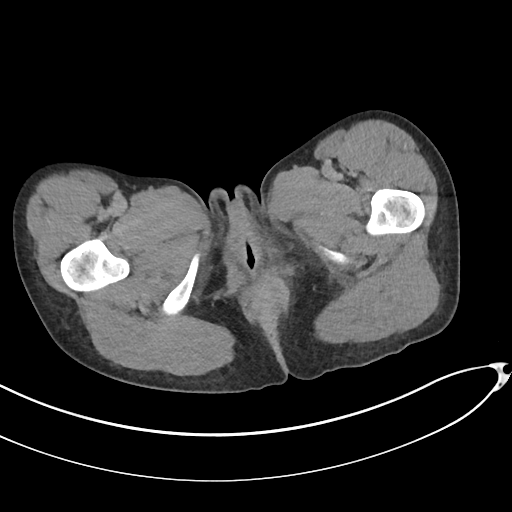
[im 14/52  soft-tissue]
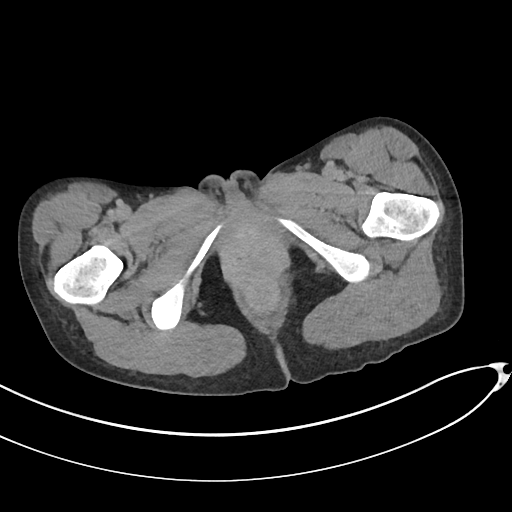
[im 18/52  soft-tissue]
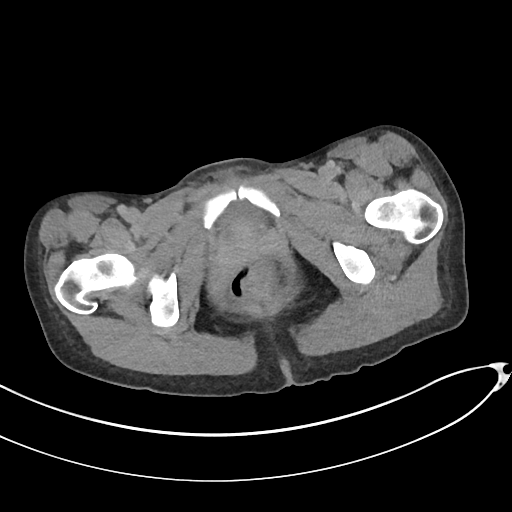
[im 24/52  soft-tissue]
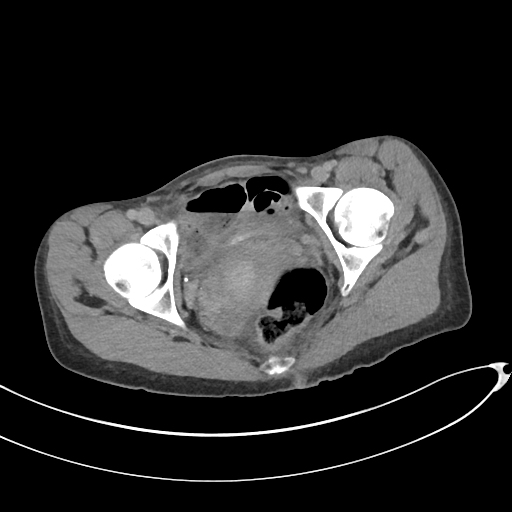
[im 28/52  soft-tissue]
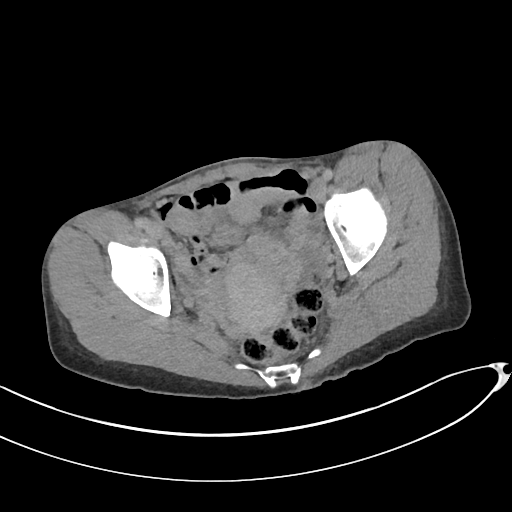
[im 35/52  soft-tissue]
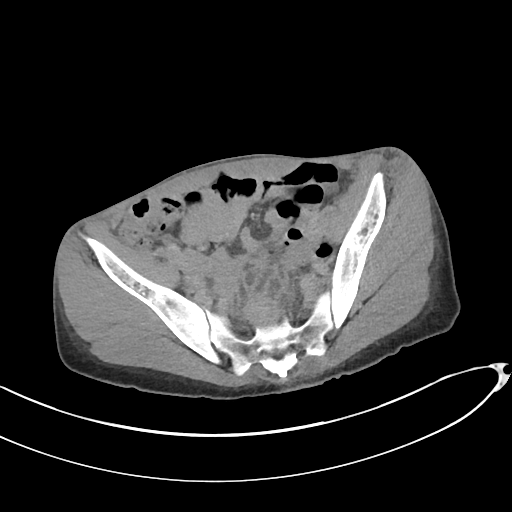
[im 38/52  soft-tissue]
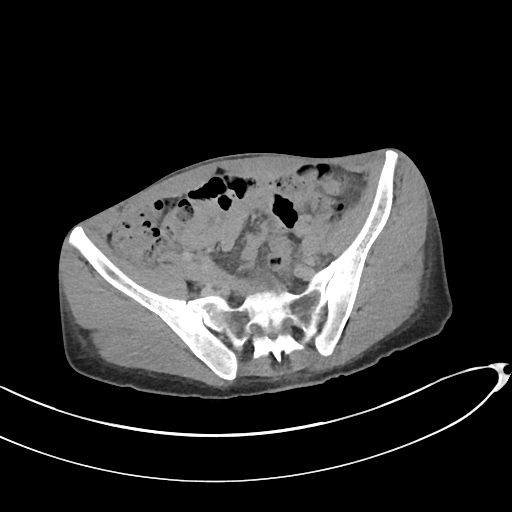
[im 38/52  lung]
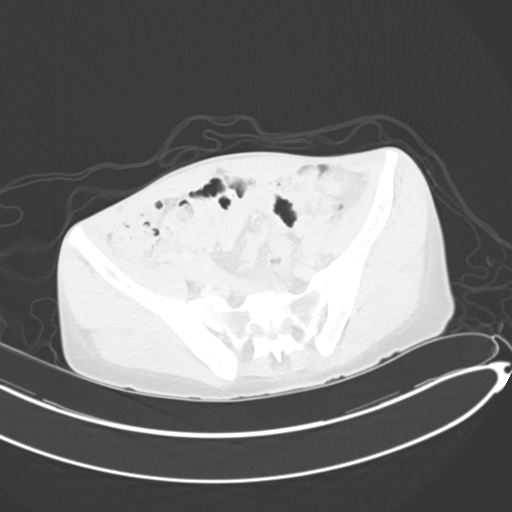
[im 41/52  soft-tissue]
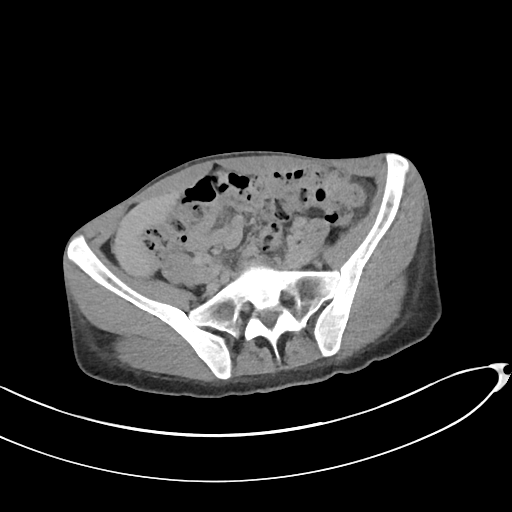
[im 41/52  lung]
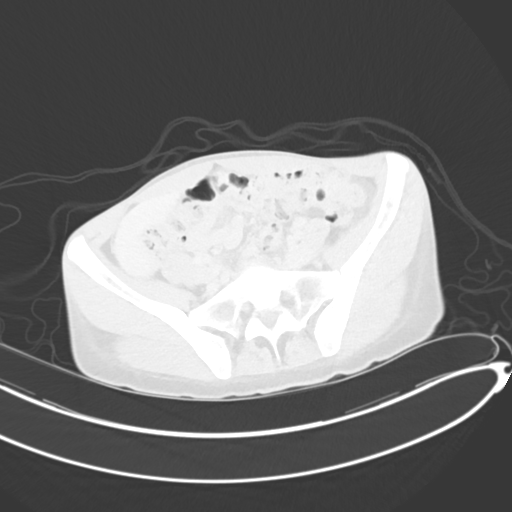
[im 41/52  bone]
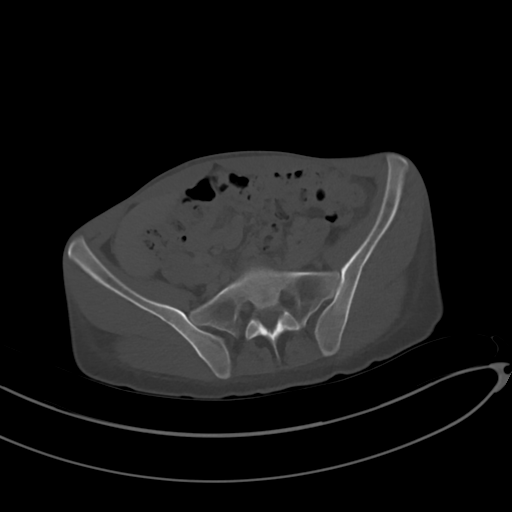
[im 45/52  lung]
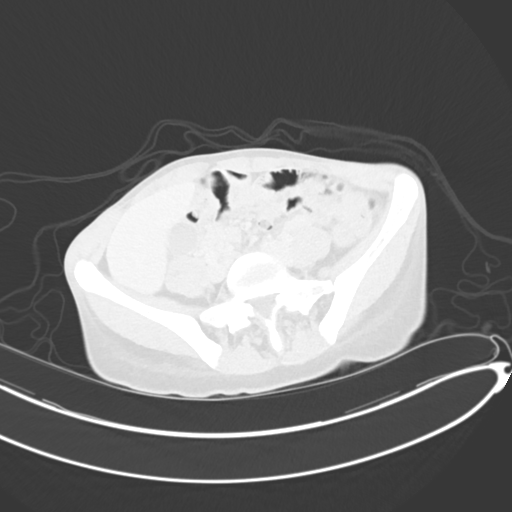
[im 48/52  soft-tissue]
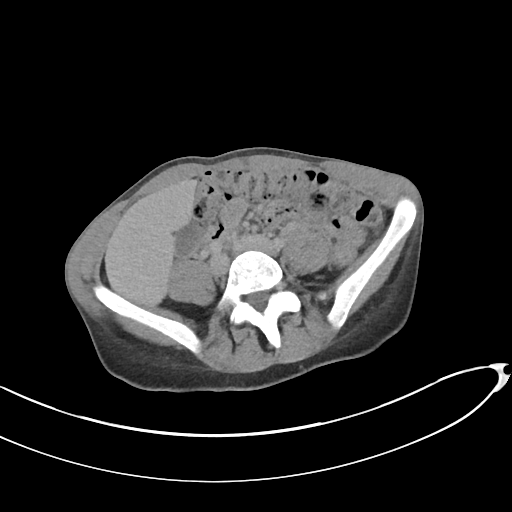
[im 48/52  lung]
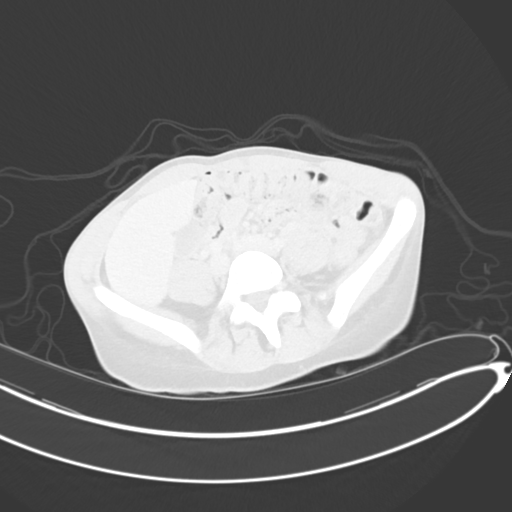

[Series 5: pelvis with 2.0 cor · coronal · 0.67mm/px · 3 of 147 slices shown]
[im 37/147  soft-tissue]
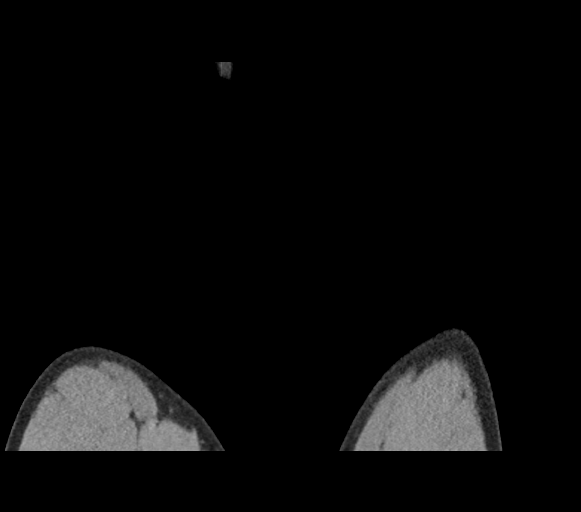
[im 74/147  soft-tissue]
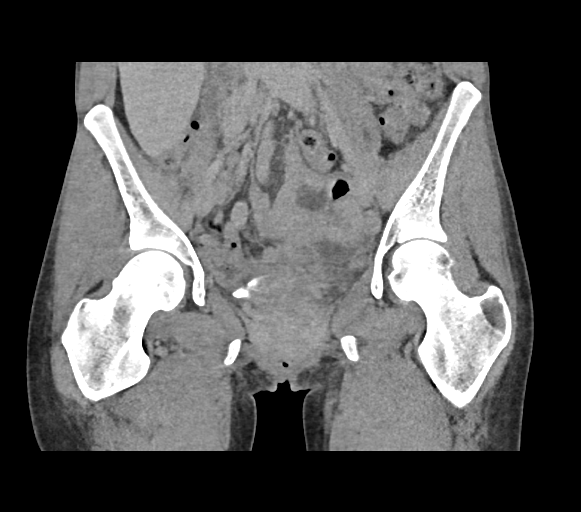
[im 110/147  soft-tissue]
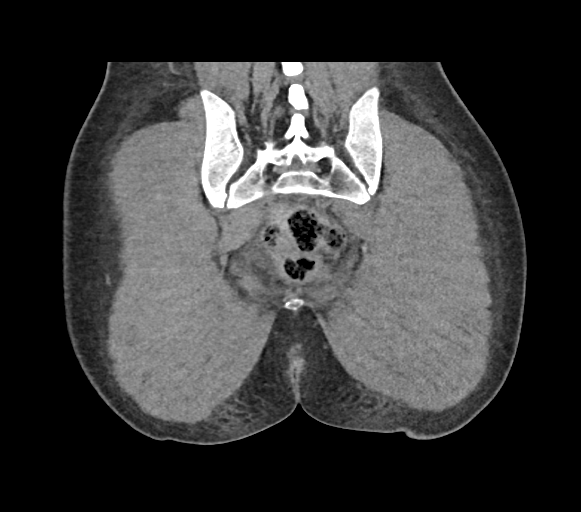

[14 of 46 positions shown; findings below may reference images not displayed]

FINDINGS: Urinary Tract:  Bladder decompressed.

Bowel:  No bowel dilatation within the visualized pelvis.

Vascular/Lymphatic: No evidence for atherosclerosis. No pelvic
sidewall lymphadenopathy.

Reproductive:  Uterus unremarkable.  No adnexal mass.

Other:  Trace free fluid evident in the cul-de-sac.

Musculoskeletal: 1.7 x 2.3 cm thick walled, irregular lesion is
identified in the left ischial anal fat, immediately adjacent to the
anus. Central low attenuation suggests a fluid component or
necrosis. No definite fistulous tract by CT. No soft tissue gas

Bone windows reveal no worrisome lytic or sclerotic osseous lesions.
IMPRESSION: 1.7 x 2.3 cm thick walled left perianal lesion likely related to
infected perianal gland. No classic CT features of perianal fistula
at this time.

## 2019-02-04 ENCOUNTER — Encounter (HOSPITAL_BASED_OUTPATIENT_CLINIC_OR_DEPARTMENT_OTHER)
Admission: RE | Admit: 2019-02-04 | Discharge: 2019-02-04 | Disposition: A | Discharge status: Home / Self Care | Payer: Medicaid Other | Source: Ambulatory Visit | Attending: Obstetrics & Gynecology | Admitting: Obstetrics & Gynecology

## 2019-02-04 DIAGNOSIS — Z01812 Encounter for preprocedural laboratory examination: Secondary | ICD-10-CM | POA: Insufficient documentation

## 2019-02-04 LAB — POCT PREGNANCY, URINE: Preg Test, Ur: NEGATIVE

## 2019-02-04 NOTE — Progress Notes (Signed)
Called to remind pt to come for PAT appointment, pt states will come sometime today.

## 2019-02-04 NOTE — Progress Notes (Signed)
PT in for PAT appt. U preg negative.  Given Ensure and instructions reviewed.

## 2019-02-04 NOTE — Progress Notes (Signed)
Reviewed history with Dr Stephannie Peters. OK to proceed as scheduled

## 2019-02-05 ENCOUNTER — Other Ambulatory Visit: Payer: Self-pay

## 2019-02-05 ENCOUNTER — Encounter (HOSPITAL_BASED_OUTPATIENT_CLINIC_OR_DEPARTMENT_OTHER): Payer: Self-pay

## 2019-02-05 ENCOUNTER — Ambulatory Visit (HOSPITAL_BASED_OUTPATIENT_CLINIC_OR_DEPARTMENT_OTHER): Payer: Medicaid Other | Admitting: Certified Registered"

## 2019-02-05 ENCOUNTER — Encounter (HOSPITAL_BASED_OUTPATIENT_CLINIC_OR_DEPARTMENT_OTHER)
Admission: RE | Disposition: A | Discharge status: Home / Self Care | Payer: Self-pay | Source: Home / Self Care | Attending: Obstetrics & Gynecology

## 2019-02-05 ENCOUNTER — Ambulatory Visit (HOSPITAL_BASED_OUTPATIENT_CLINIC_OR_DEPARTMENT_OTHER)
Admission: RE | Admit: 2019-02-05 | Discharge: 2019-02-05 | Disposition: A | Discharge status: Home / Self Care | Payer: Medicaid Other | Attending: Obstetrics & Gynecology | Admitting: Obstetrics & Gynecology

## 2019-02-05 DIAGNOSIS — N841 Polyp of cervix uteri: Secondary | ICD-10-CM | POA: Insufficient documentation

## 2019-02-05 DIAGNOSIS — F17210 Nicotine dependence, cigarettes, uncomplicated: Secondary | ICD-10-CM | POA: Diagnosis not present

## 2019-02-05 DIAGNOSIS — R102 Pelvic and perineal pain: Secondary | ICD-10-CM | POA: Diagnosis not present

## 2019-02-05 DIAGNOSIS — Z302 Encounter for sterilization: Secondary | ICD-10-CM | POA: Diagnosis present

## 2019-02-05 HISTORY — PX: LAPAROSCOPIC TUBAL LIGATION: SHX1937

## 2019-02-05 SURGERY — LIGATION, FALLOPIAN TUBE, LAPAROSCOPIC
Anesthesia: General | Site: Abdomen | Laterality: Bilateral

## 2019-02-05 MED ORDER — GLYCOPYRROLATE 0.2 MG/ML IJ SOLN
INTRAMUSCULAR | Status: DC | PRN
  Administered 2019-02-05: 0.2 mg via INTRAVENOUS

## 2019-02-05 MED ORDER — ACETAMINOPHEN 10 MG/ML IV SOLN: 1000.0000 mg | Freq: Once | INTRAVENOUS | Status: DC | PRN

## 2019-02-05 MED ORDER — BUPIVACAINE HCL (PF) 0.5 % IJ SOLN
INTRAMUSCULAR | Status: DC | PRN
  Administered 2019-02-05: 5 mL

## 2019-02-05 MED ORDER — PROPOFOL 10 MG/ML IV BOLUS
INTRAVENOUS | Status: DC | PRN
  Administered 2019-02-05: 140 mg via INTRAVENOUS

## 2019-02-05 MED ORDER — DEXAMETHASONE SODIUM PHOSPHATE 4 MG/ML IJ SOLN
INTRAMUSCULAR | Status: DC | PRN
  Administered 2019-02-05: 5 mg via INTRAVENOUS

## 2019-02-05 MED ORDER — OXYCODONE HCL 5 MG/5ML PO SOLN: 5.0000 mg | Freq: Once | ORAL | Status: DC | PRN

## 2019-02-05 MED ORDER — LIDOCAINE HCL (CARDIAC) PF 100 MG/5ML IV SOSY
PREFILLED_SYRINGE | INTRAVENOUS | Status: DC | PRN
  Administered 2019-02-05: 60 mg via INTRAVENOUS

## 2019-02-05 MED ORDER — OXYCODONE HCL 5 MG PO TABS: 5.0000 mg | ORAL_TABLET | Freq: Once | ORAL | Status: DC | PRN

## 2019-02-05 MED ORDER — OXYCODONE-ACETAMINOPHEN 5-325 MG PO TABS: 1.0000 | ORAL_TABLET | ORAL | 0 refills | Status: DC | PRN

## 2019-02-05 MED ORDER — PROPOFOL 500 MG/50ML IV EMUL
INTRAVENOUS | Status: DC | PRN
  Administered 2019-02-05: 30 ug/kg/min via INTRAVENOUS

## 2019-02-05 MED ORDER — PROPOFOL 10 MG/ML IV BOLUS
INTRAVENOUS | Status: AC
  Filled 2019-02-05: qty 40

## 2019-02-05 MED ORDER — ONDANSETRON HCL 4 MG/2ML IJ SOLN
INTRAMUSCULAR | Status: DC | PRN
  Administered 2019-02-05: 4 mg via INTRAVENOUS

## 2019-02-05 MED ORDER — LIDOCAINE 2% (20 MG/ML) 5 ML SYRINGE
INTRAMUSCULAR | Status: AC
  Filled 2019-02-05: qty 5

## 2019-02-05 MED ORDER — MIDAZOLAM HCL 2 MG/2ML IJ SOLN
INTRAMUSCULAR | Status: AC
  Filled 2019-02-05: qty 2

## 2019-02-05 MED ORDER — SUGAMMADEX SODIUM 200 MG/2ML IV SOLN
INTRAVENOUS | Status: DC | PRN
  Administered 2019-02-05: 200 mg via INTRAVENOUS

## 2019-02-05 MED ORDER — LACTATED RINGERS IV SOLN
INTRAVENOUS | Status: DC
  Administered 2019-02-05 (×2): via INTRAVENOUS

## 2019-02-05 MED ORDER — SCOPOLAMINE 1 MG/3DAYS TD PT72
MEDICATED_PATCH | TRANSDERMAL | Status: AC
  Filled 2019-02-05: qty 1

## 2019-02-05 MED ORDER — MIDAZOLAM HCL 2 MG/2ML IJ SOLN
1.0000 mg | INTRAMUSCULAR | Status: DC | PRN
  Administered 2019-02-05: 2 mg via INTRAVENOUS

## 2019-02-05 MED ORDER — BUPIVACAINE HCL (PF) 0.5 % IJ SOLN
INTRAMUSCULAR | Status: AC
  Filled 2019-02-05: qty 30

## 2019-02-05 MED ORDER — FENTANYL CITRATE (PF) 100 MCG/2ML IJ SOLN
INTRAMUSCULAR | Status: AC
  Filled 2019-02-05: qty 2

## 2019-02-05 MED ORDER — DEXAMETHASONE SODIUM PHOSPHATE 10 MG/ML IJ SOLN
INTRAMUSCULAR | Status: AC
  Filled 2019-02-05: qty 1

## 2019-02-05 MED ORDER — KETOROLAC TROMETHAMINE 30 MG/ML IJ SOLN
INTRAMUSCULAR | Status: DC | PRN
  Administered 2019-02-05: 30 mg via INTRAVENOUS

## 2019-02-05 MED ORDER — FENTANYL CITRATE (PF) 100 MCG/2ML IJ SOLN
50.0000 ug | INTRAMUSCULAR | Status: DC | PRN
  Administered 2019-02-05: 100 ug via INTRAVENOUS

## 2019-02-05 MED ORDER — ROCURONIUM BROMIDE 50 MG/5ML IV SOSY
PREFILLED_SYRINGE | INTRAVENOUS | Status: AC
  Filled 2019-02-05: qty 5

## 2019-02-05 MED ORDER — ONDANSETRON HCL 4 MG/2ML IJ SOLN
INTRAMUSCULAR | Status: AC
  Filled 2019-02-05: qty 2

## 2019-02-05 MED ORDER — EPHEDRINE SULFATE 50 MG/ML IJ SOLN
INTRAMUSCULAR | Status: DC | PRN
  Administered 2019-02-05 (×2): 30 mg via INTRAVENOUS

## 2019-02-05 MED ORDER — ACETAMINOPHEN 160 MG/5ML PO SOLN: 1000.0000 mg | Freq: Once | ORAL | Status: DC | PRN

## 2019-02-05 MED ORDER — ACETAMINOPHEN 500 MG PO TABS: 1000.0000 mg | ORAL_TABLET | Freq: Once | ORAL | Status: DC | PRN

## 2019-02-05 MED ORDER — SUCCINYLCHOLINE CHLORIDE 20 MG/ML IJ SOLN
INTRAMUSCULAR | Status: DC | PRN
  Administered 2019-02-05: 30 mg via INTRAVENOUS

## 2019-02-05 MED ORDER — SCOPOLAMINE 1 MG/3DAYS TD PT72: 1.0000 | MEDICATED_PATCH | Freq: Once | TRANSDERMAL | Status: DC | PRN

## 2019-02-05 MED ORDER — ROCURONIUM BROMIDE 100 MG/10ML IV SOLN
INTRAVENOUS | Status: DC | PRN
  Administered 2019-02-05: 30 mg via INTRAVENOUS

## 2019-02-05 MED ORDER — FENTANYL CITRATE (PF) 100 MCG/2ML IJ SOLN
25.0000 ug | INTRAMUSCULAR | Status: DC | PRN
  Administered 2019-02-05: 25 ug via INTRAVENOUS
  Administered 2019-02-05: 50 ug via INTRAVENOUS
  Administered 2019-02-05: 25 ug via INTRAVENOUS
  Administered 2019-02-05: 50 ug via INTRAVENOUS

## 2019-02-05 SURGICAL SUPPLY — 30 items
BANDAGE ADH SHEER 1  50/CT (GAUZE/BANDAGES/DRESSINGS) IMPLANT
BRIEF STRETCH FOR OB PAD XXL (UNDERPADS AND DIAPERS) ×2 IMPLANT
CLIP FILSHIE TUBAL LIGA STRL (Clip) ×1 IMPLANT
DRSG OPSITE POSTOP 3X4 (GAUZE/BANDAGES/DRESSINGS) IMPLANT
DURAPREP 26ML APPLICATOR (WOUND CARE) ×2 IMPLANT
GLOVE BIO SURGEON STRL SZ 6.5 (GLOVE) ×2 IMPLANT
GLOVE BIOGEL PI IND STRL 7.0 (GLOVE) ×2 IMPLANT
GLOVE BIOGEL PI INDICATOR 7.0 (GLOVE) ×2
GOWN STRL REUS W/ TWL XL LVL3 (GOWN DISPOSABLE) ×1 IMPLANT
GOWN STRL REUS W/TWL LRG LVL3 (GOWN DISPOSABLE) ×2 IMPLANT
GOWN STRL REUS W/TWL XL LVL3 (GOWN DISPOSABLE) ×2
NEEDLE INSUFFLATION 120MM (ENDOMECHANICALS) ×2 IMPLANT
NS IRRIG 1000ML POUR BTL (IV SOLUTION) ×2 IMPLANT
PACK LAPAROSCOPY BASIN (CUSTOM PROCEDURE TRAY) ×2 IMPLANT
PACK TRENDGUARD 450 HYBRID PRO (MISCELLANEOUS) IMPLANT
PACK TRENDGUARD 600 HYBRD PROC (MISCELLANEOUS) IMPLANT
PAD OB MATERNITY 4.3X12.25 (PERSONAL CARE ITEMS) ×2 IMPLANT
PAD PREP 24X48 CUFFED NSTRL (MISCELLANEOUS) ×2 IMPLANT
SHEARS HARMONIC ACE PLUS 36CM (ENDOMECHANICALS) IMPLANT
SLEEVE SCD COMPRESS KNEE MED (MISCELLANEOUS) ×2 IMPLANT
SLEEVE XCEL OPT CAN 5 100 (ENDOMECHANICALS) IMPLANT
SUT VICRYL 0 UR6 27IN ABS (SUTURE) ×2 IMPLANT
SUT VICRYL 4-0 PS2 18IN ABS (SUTURE) ×2 IMPLANT
SYSTEM CARTER THOMASON II (TROCAR) IMPLANT
TOWEL GREEN STERILE FF (TOWEL DISPOSABLE) ×4 IMPLANT
TRENDGUARD 450 HYBRID PRO PACK (MISCELLANEOUS)
TRENDGUARD 600 HYBRID PROC PK (MISCELLANEOUS)
TROCAR OPTI TIP 5M 100M (ENDOMECHANICALS) IMPLANT
TROCAR XCEL DIL TIP R 11M (ENDOMECHANICALS) ×2 IMPLANT
TUBING INSUFFLATION (TUBING) ×2 IMPLANT

## 2019-02-05 NOTE — Op Note (Signed)
02/05/2019  10:33 AM  PATIENT:  [name] [name]  33 y.o. female  PRE-OPERATIVE DIAGNOSIS:  Undesired Fertility and pelvic pain  POST-OPERATIVE DIAGNOSIS: same + cervical polyp  PROCEDURE:  Procedure(s): LAPAROSCOPIC TUBAL LIGATION (Bilateral)  SURGEON:  Surgeon(s) and Role:    * Koury Roddy C, MD - Primary  ANESTHESIA:   local and general  EBL:  minimal   BLOOD ADMINISTERED:none  DRAINS: none   LOCAL MEDICATIONS USED:  MARCAINE     SPECIMEN:  Source of Specimen:  cervical polyp  DISPOSITION OF SPECIMEN:  PATHOLOGY  COUNTS:  YES  TOURNIQUET:  * No tourniquets in log *  DICTATION: .Dragon Dictation  PLAN OF CARE: Discharge to home after PACU  PATIENT DISPOSITION:  PACU - hemodynamically stable.   Delay start of Pharmacological VTE agent (>24hrs) due to surgical blood loss or risk of bleeding: not applicable   The risks, benefits, alternatives of surgery were explained understood, accepted. All questions were answered. She understands the failure rate of 1/200. She declines alternative forms of birth control. Her urine pregnancy test was negative. She was taken to the operating room and general anesthesia was applied without complication. She was placed in dorsal lithotomy position. Her abdomen and vagina were prepped and draped in the usual sterile fashion. A time out procedure was done. A bimanual exam revealed a normal, size and shape mobile uterus with nonenlarged adnexa. After placing a speculum, I saw a cervical polyp. I removed this by twisting it. Silver nitrate was applied to achieve hemostasis.  A Hulka manipulator was placed on the cervix. Gloves were changed and attention was turned to the abdomen. Approximately 5 mL of 0.5% Marcaine was used to infiltrate the subcutaneous tissue at the umbilicus. A vertical 1 cm incision was made. A Veres needle was placed in the pelvis. Low-flow CO2 was used to insufflate the abdomen to approximately 3 L. After good pneumoperitoneum  was established, a 11 mm trocar was placed. Laparoscopy confirmed correct placement. She was placed in the Trendelenburg position. Patient abdominal pressure was always less than 15. Her uterus ovaries and tubes appeared normal. There was no evidence of endometriosis. There were no adhesions or other finding to explain her dysmenorrhea. The oviducts were visually traced to their fimbriated end on each side. In the isthmic region of each oviduct, a Filshie clip was placed across the entire oviduct. There was no bleeding. The CO2 was allowed to escape from her abdomen. The umbilical fascia was closed with a figure of 0 Vicryl suture. No defects were palpable. The subcuticular closure was done with 4-0 Vicryl suture. The Hulka manipulator was removed. She was extubated and taken to recovery in stable condition. She tolerated the procedure well. The instrument, sponge, and needle counts were correct.

## 2019-02-05 NOTE — Anesthesia Procedure Notes (Signed)
Procedure Name: Intubation Performed by: Verita Lamb, CRNA Pre-anesthesia Checklist: Patient identified, Suction available, Emergency Drugs available, Patient being monitored and Timeout performed Patient Re-evaluated:Patient Re-evaluated prior to induction Oxygen Delivery Method: Circle system utilized Preoxygenation: Pre-oxygenation with 100% oxygen Induction Type: IV induction, Rapid sequence and Cricoid Pressure applied Laryngoscope Size: Mac and 3 Grade View: Grade I Tube type: Oral Tube size: 7.0 mm Number of attempts: 1 Airway Equipment and Method: Stylet Placement Confirmation: ETT inserted through vocal cords under direct vision,  positive ETCO2,  CO2 detector and breath sounds checked- equal and bilateral Secured at: 22 cm Tube secured with: Tape Dental Injury: Teeth and Oropharynx as per pre-operative assessment

## 2019-02-05 NOTE — Anesthesia Preprocedure Evaluation (Addendum)
Anesthesia Evaluation  Patient identified by MRN, date of birth, ID band Patient awake    Reviewed: Allergy & Precautions, NPO status , Patient's Chart, lab work & pertinent test results  History of Anesthesia Complications Negative for: history of anesthetic complications  Airway Mallampati: II  TM Distance: >3 FB Neck ROM: Full    Dental  (+) Teeth Intact   Pulmonary Recent URI , Current Smoker,    breath sounds clear to auscultation       Cardiovascular negative cardio ROS   Rhythm:Regular     Neuro/Psych  Headaches, PSYCHIATRIC DISORDERS Anxiety Depression    GI/Hepatic negative GI ROS, (+)     substance abuse  cocaine use and marijuana use,   Endo/Other  negative endocrine ROS  Renal/GU negative Renal ROS     Musculoskeletal   Abdominal   Peds  Hematology  (+) anemia ,   Anesthesia Other Findings   Reproductive/Obstetrics                           Anesthesia Physical Anesthesia Plan  ASA: II  Anesthesia Plan: General   Post-op Pain Management:    Induction: Intravenous  PONV Risk Score and Plan: 2 and Ondansetron, Dexamethasone and Scopolamine patch - Pre-op  Airway Management Planned: Oral ETT  Additional Equipment: None  Intra-op Plan:   Post-operative Plan: Extubation in OR  Informed Consent: I have reviewed the patients History and Physical, chart, labs and discussed the procedure including the risks, benefits and alternatives for the proposed anesthesia with the patient or authorized representative who has indicated his/her understanding and acceptance.     Dental advisory given  Plan Discussed with: CRNA and Surgeon  Anesthesia Plan Comments:         Anesthesia Quick Evaluation

## 2019-02-05 NOTE — Transfer of Care (Signed)
Immediate Anesthesia Transfer of Care Note  Patient: [name] [name]  Procedure(s) Performed: LAPAROSCOPIC TUBAL LIGATION (Bilateral Abdomen)  Patient Location: PACU  Anesthesia Type:General  Level of Consciousness: awake, alert  and oriented  Airway & Oxygen Therapy: Patient Spontanous Breathing and Patient connected to face mask oxygen  Post-op Assessment: Report given to RN and Post -op Vital signs reviewed and stable  Post vital signs: Reviewed and stable  Last Vitals:  Vitals Value Taken Time  BP 119/74 02/05/2019 10:37 AM  Temp    Pulse 94 02/05/2019 10:38 AM  Resp 25 02/05/2019 10:38 AM  SpO2 100 % 02/05/2019 10:38 AM  Vitals shown include unvalidated device data.  Last Pain:  Vitals:   02/05/19 0849  TempSrc: Oral  PainSc: 0-No pain         Complications: No apparent anesthesia complications and Patient re-intubated

## 2019-02-05 NOTE — Discharge Instructions (Signed)
Post Anesthesia Home Care Instructions  Activity: Get plenty of rest for the remainder of the day. A responsible individual must stay with you for 24 hours following the procedure.  For the next 24 hours, DO NOT: -Drive a car -Advertising copywriter -Drink alcoholic beverages -Take any medication unless instructed by your physician -Make any legal decisions or sign important papers.  Meals: Start with liquid foods such as gelatin or soup. Progress to regular foods as tolerated. Avoid greasy, spicy, heavy foods. If nausea and/or vomiting occur, drink only clear liquids until the nausea and/or vomiting subsides. Call your physician if vomiting continues.  Special Instructions/Symptoms: Your throat may feel dry or sore from the anesthesia or the breathing tube placed in your throat during surgery. If this causes discomfort, gargle with warm salt water. The discomfort should disappear within 24 hours.  If you had a scopolamine patch placed behind your ear for the management of post- operative nausea and/or vomiting:  1. The medication in the patch is effective for 72 hours, after which it should be removed.  Wrap patch in a tissue and discard in the trash. Wash hands thoroughly with soap and water. 2. You may remove the patch earlier than 72 hours if you experience unpleasant side effects which may include dry mouth, dizziness or visual disturbances. 3. Avoid touching the patch. Wash your hands with soap and water after contact with the patch.      Laparoscopic Tubal Ligation, Care After Refer to this sheet in the next few weeks. These instructions provide you with information about caring for yourself after your procedure. Your health care provider may also give you more specific instructions. Your treatment has been planned according to current medical practices, but problems sometimes occur. Call your health care provider if you have any problems or questions after your procedure. What can I  expect after the procedure? After the procedure, it is common to have:  A sore throat.  Discomfort in your shoulder.  Mild discomfort or cramping in your abdomen.  Gas pains.  Pain or soreness in the area where the surgical cut (incision) was made.  A bloated feeling.  Tiredness.  Nausea.  Vomiting. Follow these instructions at home: Medicines  Take over-the-counter and prescription medicines only as told by your health care provider.  Do not take aspirin because it can cause bleeding.  Do not drive or operate heavy machinery while taking prescription pain medicine. Activity  Rest for the rest of the day.  Return to your normal activities as told by your health care provider. Ask your health care provider what activities are safe for you. Incision care      Follow instructions from your health care provider about how to take care of your incision. Make sure you: ? Wash your hands with soap and water before you change your bandage (dressing). If soap and water are not available, use hand sanitizer. ? Change your dressing as told by your health care provider. ? Leave stitches (sutures) in place. They may need to stay in place for 2 weeks or longer.  Check your incision area every day for signs of infection. Check for: ? More redness, swelling, or pain. ? More fluid or blood. ? Warmth. ? Pus or a bad smell. Other Instructions  Do not take baths, swim, or use a hot tub until your health care provider approves. You may take showers.  Keep all follow-up visits as told by your health care provider. This is important.  Have  someone help you with your daily household tasks for the first few days. Contact a health care provider if:  You have more redness, swelling, or pain around your incision.  Your incision feels warm to the touch.  You have pus or a bad smell coming from your incision.  The edges of your incision break open after the sutures have been  removed.  Your pain does not improve after 2-3 days.  You have a rash.  You repeatedly become dizzy or light-headed.  Your pain medicine is not helping.  You are constipated. Get help right away if:  You have a fever.  You faint.  You have increasing pain in your abdomen.  You have severe pain in one or both of your shoulders.  You have fluid or blood coming from your sutures or from your vagina.  You have shortness of breath or difficulty breathing.  You have chest pain or leg pain.  You have ongoing nausea, vomiting, or diarrhea. This information is not intended to replace advice given to you by your health care provider. Make sure you discuss any questions you have with your health care provider. Document Released: 06/23/2005 Document Revised: 07/31/2017 Document Reviewed: 11/14/2015 Elsevier Interactive Patient Education  2019 [employer].

## 2019-02-05 NOTE — H&P (Addendum)
[name] [name] is a 33 yo single P4 here for a BTL. Her current partner is incarcerated and will be there for months more. She is sure that she doesn't want more kids. She denies and depression symtoms.   Patient's last menstrual period was 01/30/2019 (approximate).  She has relatively heavy periods, lasts 4-5 days. She reports very painful periods for years. She has used OCPs for this in the past. She got a depo provera shot 1/20 which has kept her periods away as well as the pain.   Past Medical History:  Diagnosis Date  . Abscess of axilla   . Anemia   . Anxiety   . Depression    bipolar; not on meds  . Headache(784.0)   . Heart murmur    as a child  . Hemorrhoids   . Hypokalemia 06/17/2012  . Perianal abscess 12/2017  . 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  . Substance abuse (HCC)   . Suicide attempt (HCC) 06/19/2012  . UTI (lower urinary tract infection) 06/19/2012    Past Surgical History:  Procedure Laterality Date  . DENTAL SURGERY    . INCISE AND DRAIN ABCESS  01/16/2018  . INCISION AND DRAINAGE PERIRECTAL ABSCESS N/A 01/16/2018   Procedure: IRRIGATION AND DEBRIDEMENT PERIRECTAL ABSCESS;  Surgeon: Sheliah Hatch De Blanch, MD;  Location: [facility] OR;  Service: General;  Laterality: N/A;    Family History  Problem Relation Age of Onset  . Hypertension Mother   . Diabetes type II Mother   . Arthritis Mother   . Hyperlipidemia Mother   . Cancer Father        lung    Social History:  reports that she has been smoking cigarettes. She has been smoking about 0.00 packs per day for the past 9.00 years. She has never used smokeless tobacco. She reports previous alcohol use. She reports previous drug use. Frequency: 3.00 times per week. Drugs: Marijuana and Cocaine.  Allergies:  Allergies  Allergen Reactions  . Penicillins Nausea And Vomiting    Has patient had a PCN reaction causing immediate rash, facial/tongue/throat swelling, SOB or lightheadedness with hypotension: No Has  patient had a PCN reaction causing severe rash involving mucus membranes or skin necrosis: No Has patient had a PCN reaction that required hospitalization Yes Has patient had a PCN reaction occurring within the last 10 years: No If all of the above answers are "NO", then may proceed with Cephalosporin use.     Medications Prior to Admission  Medication Sig Dispense Refill Last Dose  . ibuprofen (ADVIL,MOTRIN) 800 MG tablet Take 1 tablet (800 mg total) by mouth every 8 (eight) hours as needed. 60 tablet 1 More than a month at Unknown time    ROS She plans to start working at [employer].  Blood pressure 95/60, pulse 70, temperature 97.9 F (36.6 C), temperature source Oral, resp. rate 16, height 5\' 7"  (1.702 m), weight 54.9 kg, last menstrual period 01/30/2019, SpO2 100 %, not currently breastfeeding. Physical Exam  Heart- rrr Lungs- CTAB Abd- benign  Results for orders placed or performed during the hospital encounter of 02/04/19 (from the past 24 hour(s))  Pregnancy, urine POC     Status: None   Collection Time: 02/04/19  3:10 PM  Result Value Ref Range   Preg Test, Ur NEGATIVE NEGATIVE    No results found.  Assessment/Plan: Unwanted fertility- plan for laparoscopic application of Filsche clips  She understands the risks of surgery, including, but not to infection, bleeding, DVTs, damage to bowel,  bladder, ureters. She wishes to proceed. She understands that this is a permanent procedure. She is aware of the small risk of failure and/or tubal pregnancy.     Allie Bossier 02/05/2019, 9:19 AM

## 2019-02-06 ENCOUNTER — Encounter (HOSPITAL_BASED_OUTPATIENT_CLINIC_OR_DEPARTMENT_OTHER): Payer: Self-pay | Admitting: Obstetrics & Gynecology

## 2019-02-06 NOTE — Anesthesia Postprocedure Evaluation (Signed)
Anesthesia Post Note  Patient: [name] [name]  Procedure(s) Performed: LAPAROSCOPIC TUBAL LIGATION WITH CERVICAL POLYPECTOMY (Bilateral Abdomen)     Patient location during evaluation: PACU Anesthesia Type: General Level of consciousness: awake and alert Pain management: pain level controlled Vital Signs Assessment: post-procedure vital signs reviewed and stable Respiratory status: spontaneous breathing, nonlabored ventilation, respiratory function stable and patient connected to nasal cannula oxygen Cardiovascular status: blood pressure returned to baseline and stable Postop Assessment: no apparent nausea or vomiting Anesthetic complications: no    Last Vitals:  Vitals:   02/05/19 1200 02/05/19 1245  BP: 107/71 115/69  Pulse: 74 61  Resp: 19 18  Temp:  36.6 C  SpO2: 100% 100%    Last Pain:  Vitals:   02/05/19 1245  TempSrc:   PainSc: 3                  Tegan Burnside

## 2019-07-03 ENCOUNTER — Encounter (HOSPITAL_COMMUNITY): Payer: Self-pay | Admitting: *Deleted

## 2019-07-03 ENCOUNTER — Other Ambulatory Visit: Payer: Self-pay

## 2019-07-03 ENCOUNTER — Emergency Department (HOSPITAL_COMMUNITY): Payer: Medicaid Other

## 2019-07-03 ENCOUNTER — Emergency Department (HOSPITAL_COMMUNITY)
Admission: EM | Admit: 2019-07-03 | Discharge: 2019-07-03 | Disposition: A | Discharge status: Home / Self Care | Payer: Medicaid Other | Attending: Emergency Medicine | Admitting: Emergency Medicine

## 2019-07-03 DIAGNOSIS — F17210 Nicotine dependence, cigarettes, uncomplicated: Secondary | ICD-10-CM | POA: Diagnosis not present

## 2019-07-03 DIAGNOSIS — R062 Wheezing: Secondary | ICD-10-CM | POA: Diagnosis not present

## 2019-07-03 DIAGNOSIS — R05 Cough: Secondary | ICD-10-CM | POA: Diagnosis present

## 2019-07-03 DIAGNOSIS — R059 Cough, unspecified: Secondary | ICD-10-CM

## 2019-07-03 LAB — URINALYSIS, ROUTINE W REFLEX MICROSCOPIC
Bilirubin Urine: NEGATIVE
Glucose, UA: NEGATIVE mg/dL
Hgb urine dipstick: NEGATIVE
Ketones, ur: NEGATIVE mg/dL
Leukocytes,Ua: NEGATIVE
Nitrite: NEGATIVE
Protein, ur: NEGATIVE mg/dL
Specific Gravity, Urine: 1.013 (ref 1.005–1.030)
pH: 7 (ref 5.0–8.0)

## 2019-07-03 LAB — POC URINE PREG, ED: Preg Test, Ur: NEGATIVE

## 2019-07-03 MED ORDER — PREDNISONE 20 MG PO TABS
60.0000 mg | ORAL_TABLET | Freq: Once | ORAL | Status: AC
  Administered 2019-07-03: 60 mg via ORAL
  Filled 2019-07-03: qty 3

## 2019-07-03 MED ORDER — ALBUTEROL SULFATE HFA 108 (90 BASE) MCG/ACT IN AERS: 1.0000 | INHALATION_SPRAY | Freq: Four times a day (QID) | RESPIRATORY_TRACT | 1 refills | Status: DC | PRN

## 2019-07-03 MED ORDER — ALBUTEROL SULFATE HFA 108 (90 BASE) MCG/ACT IN AERS
2.0000 | INHALATION_SPRAY | Freq: Once | RESPIRATORY_TRACT | Status: AC
  Administered 2019-07-03: 2 via RESPIRATORY_TRACT
  Filled 2019-07-03: qty 6.7

## 2019-07-03 MED ORDER — PREDNISONE 50 MG PO TABS: ORAL_TABLET | ORAL | 0 refills | Status: DC

## 2019-07-03 NOTE — ED Notes (Signed)
Pt stated complaining of lower abdominal pain. Dr. Christy Gentles aware, orders for UA

## 2019-07-03 NOTE — ED Notes (Signed)
Pt verbalized understanding of d/c instructions and has no further questions, VSS, NAD.  

## 2019-07-03 NOTE — ED Triage Notes (Signed)
C/o cough states she coughs only at night and this has lasted 5 hours, states she normally uses her mother's inhaler however  She wasn't able to find the inhaler tonight.

## 2019-07-03 NOTE — ED Provider Notes (Signed)
Pt here for wheezing, cough. Smoker, uses mothers inhaler at time but did not have access to one tonight. No fever, no sick contacts. Feels better after albuterol here. Possible urinary symptoms. Will get CXR, U/a. Anticipate d/c with albuterol and steroids.  Chest x-ray is normal.  Urinalysis unremarkable.  Patient felt better after inhaler.  Prescription for steroids given.  Given information for primary care follow-up.  Given return precautions.  This chart was dictated using voice recognition software.  Despite best efforts to proofread,  errors can occur which can change the documentation meaning.     Lennice Sites, DO 07/03/19 [phone]

## 2019-07-03 NOTE — Discharge Instructions (Signed)
Urine showed no signs of infection.

## 2019-07-03 NOTE — ED Notes (Signed)
Patient transported to X-ray 

## 2019-07-03 NOTE — ED Provider Notes (Signed)
[facility] Note   CSN: [id number] Arrival date & time: 07/03/19  0605     History   Chief Complaint Chief Complaint  Patient presents with  . Cough    HPI [name] [name] is a [age] y.o. female.     The history is provided by the patient and the EMS personnel.  Cough Cough characteristics:  Non-productive Severity:  Moderate Onset quality:  Gradual Timing:  Intermittent Progression:  Worsening Chronicity:  Recurrent Smoker: yes   Relieved by:  Nothing Worsened by:  Nothing Associated symptoms: shortness of breath   Associated symptoms: no diaphoresis, no fever and no sore throat   Associated symptoms comment:  Chest pain with cough  Patient with history of depression, previous substance abuse presents with cough. Patient reports she coughs about every night, she would typically use her mother's inhaler and feel improved.  Tonight she has had access to inhalers that she cannot stop coughing.  She reports shortness of breath.  No fever, no hemoptysis.  She is a daily smoker. She has not had her own home inhaler. She reports coughing at night for some time  Past Medical History:  Diagnosis Date  . Abscess of axilla   . Anemia   . Anxiety   . Depression    bipolar; not on meds  . Headache(784.0)   . Heart murmur    as a child  . Hemorrhoids   . Hypokalemia 06/17/2012  . Perianal abscess 12/2017  . 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  . Substance abuse (HCC)   . Suicide attempt (HCC) 06/19/2012  . UTI (lower urinary tract infection) 06/19/2012    Patient Active Problem List   Diagnosis Date Noted  . Postpartum care and examination 01/09/2019  . ASCUS with positive high risk HPV cervical 11/25/2018  . Rubella non-immune status, antepartum 11/25/2018  . Trichomonas infection 11/25/2018  . Iron deficiency anemia due to chronic blood loss 07/04/2018  . Inadequate social support 07/04/2018  . History of Cocaine abuse  affecting pregnancy in third trimester (HCC) 04/2016 04/27/2016  . Tobacco abuse 06/17/2012    Past Surgical History:  Procedure Laterality Date  . DENTAL SURGERY    . INCISE AND DRAIN ABCESS  01/16/2018  . INCISION AND DRAINAGE PERIRECTAL ABSCESS N/A 01/16/2018   Procedure: IRRIGATION AND DEBRIDEMENT PERIRECTAL ABSCESS;  Surgeon: Sheliah HatchKinsinger, De BlanchLuke Aaron, MD;  [facility];  Service: General;  Laterality: N/A;  . LAPAROSCOPIC TUBAL LIGATION Bilateral 02/05/2019   Procedure: LAPAROSCOPIC TUBAL LIGATION WITH CERVICAL POLYPECTOMY;  Surgeon: Allie Bossierove, Myra C, MD;  Location: [facility];  Service: Gynecology;  Laterality: Bilateral;     OB History    Gravida  5   Para  4   Term  3   Preterm  1   AB  1   Living  4     SAB  1   TAB  0   Ectopic  0   Multiple  0   Live Births  4            Home Medications    Prior to Admission medications   Medication Sig Start Date End Date Taking? Authorizing Provider  albuterol (VENTOLIN HFA) 108 (90 Base) MCG/ACT inhaler Inhale 1-2 puffs into the lungs every 6 (six) hours as needed for wheezing or shortness of breath. 07/03/19   Zadie RhineWickline, Hortence Charter, MD  predniSONE (DELTASONE) 50 MG tablet One tablet PO daily 07/03/19   Zadie RhineWickline, Marlies Ligman, MD  Family History Family History  Problem Relation Age of Onset  . Hypertension Mother   . Diabetes type II Mother   . Arthritis Mother   . Hyperlipidemia Mother   . Cancer Father        lung    Social History Social History   Tobacco Use  . Smoking status: Current Every Day Smoker    Packs/day: 0.00    Years: 9.00    Pack years: 0.00    Types: Cigarettes    Last attempt to quit: 01/14/2018    Years since quitting: 1.4  . Smokeless tobacco: Never Used  . Tobacco comment: 3 cigarettes per day  Substance Use Topics  . Alcohol use: Not Currently    Comment: rare  . Drug use: Not Currently    Frequency: 3.0 times per week    Types: Marijuana, Cocaine    Comment: last  time was 02/05/2019, pt states once a week     Allergies   Penicillins   Review of Systems Review of Systems  Constitutional: Negative for diaphoresis, fatigue, fever and unexpected weight change.       Denies night sweats   HENT: Negative for sore throat.   Respiratory: Positive for cough and shortness of breath.   Cardiovascular: Negative for leg swelling.  Gastrointestinal: Positive for abdominal pain.  Genitourinary: Negative for vaginal bleeding.  All other systems reviewed and are negative.    Physical Exam Updated Vital Signs BP 96/68   Pulse 82   Temp (!) 97.3 F (36.3 C) (Oral)   Resp (!) 24   Ht 1.626 m (5\' 4" )   Wt 54.4 kg   SpO2 100%   BMI 20.60 kg/m   Physical Exam  CONSTITUTIONAL: Mildly disheveled, coughs frequently during exam HEAD: Normocephalic/atraumatic EYES: EOMI/PERRL ENMT: Mucous membranes moist, uvula midline without erythema NECK: supple no meningeal signs SPINE/BACK:entire spine nontender CV: S1/S2 noted LUNGS: Scattered wheezing in upper lung fields, no crackles, mild tachypnea, cough frequent during exam ABDOMEN: soft, nontender NEURO: Pt is awake/alert/appropriate, moves all extremitiesx4.  No facial droop.   EXTREMITIES: pulses normal/equal, full ROM, no lower extremity edema or calf tenderness SKIN: warm, color normal PSYCH: no abnormalities of mood noted, alert and oriented to situation  ED Treatments / Results  Labs (all labs ordered are listed, but only abnormal results are displayed) Labs Reviewed  URINALYSIS, ROUTINE W REFLEX MICROSCOPIC  POC URINE PREG, ED    EKG None  Radiology No results found.  Procedures Procedures    Medications Ordered in ED Medications  albuterol (VENTOLIN HFA) 108 (90 Base) MCG/ACT inhaler 2 puff (2 puffs Inhalation Given 07/03/19 0619)  predniSONE (DELTASONE) tablet 60 mg (60 mg Oral Given 07/03/19 [id number])     Initial Impression / Assessment and Plan / ED Course  I have reviewed the  triage vital signs and the nursing notes.  Pertinent  imaging results that were available during my care of the patient were reviewed by me and considered in my medical decision making (see chart for details).        Patient reports she coughs every night and is improved with albuterol.  She has not been formally evaluated with this.  She is also a smoker. Due to chronicity of symptoms, this is unlikely COVID-19. She also mentions recent abdominal pain has been intermittent.  No vaginal bleeding. We will check urinalysis.  7:03 AM At signout to dr Lockie Molacuratolo, f/u on CXR and urine findings Pt is already improving, anticipate discharge  Final Clinical Impressions(s) / ED Diagnoses   Final diagnoses:  Wheezing  Cough    ED Discharge Orders         Ordered    albuterol (VENTOLIN HFA) 108 (90 Base) MCG/ACT inhaler  Every 6 hours PRN     07/03/19 0635    predniSONE (DELTASONE) 50 MG tablet     07/03/19 5056           Ripley Fraise, MD 07/03/19 [phone]

## 2019-10-23 ENCOUNTER — Other Ambulatory Visit: Payer: Self-pay

## 2019-10-23 DIAGNOSIS — Z20822 Contact with and (suspected) exposure to covid-19: Secondary | ICD-10-CM

## 2019-10-25 LAB — NOVEL CORONAVIRUS, NAA: SARS-CoV-2, NAA: NOT DETECTED

## 2019-12-15 ENCOUNTER — Other Ambulatory Visit: Payer: Self-pay

## 2019-12-15 ENCOUNTER — Encounter (HOSPITAL_COMMUNITY): Payer: Self-pay

## 2019-12-15 ENCOUNTER — Emergency Department (HOSPITAL_COMMUNITY)
Admission: EM | Admit: 2019-12-15 | Discharge: 2019-12-15 | Disposition: A | Discharge status: Home / Self Care | Payer: Medicaid Other | Attending: Emergency Medicine | Admitting: Emergency Medicine

## 2019-12-15 DIAGNOSIS — Z79899 Other long term (current) drug therapy: Secondary | ICD-10-CM | POA: Insufficient documentation

## 2019-12-15 DIAGNOSIS — F17210 Nicotine dependence, cigarettes, uncomplicated: Secondary | ICD-10-CM | POA: Diagnosis not present

## 2019-12-15 DIAGNOSIS — L03032 Cellulitis of left toe: Secondary | ICD-10-CM | POA: Diagnosis not present

## 2019-12-15 DIAGNOSIS — M79675 Pain in left toe(s): Secondary | ICD-10-CM | POA: Diagnosis present

## 2019-12-15 MED ORDER — DOXYCYCLINE HYCLATE 100 MG PO CAPS: 100.0000 mg | ORAL_CAPSULE | Freq: Two times a day (BID) | ORAL | 0 refills | Status: DC

## 2019-12-15 MED ORDER — ACETAMINOPHEN 325 MG PO TABS
650.0000 mg | ORAL_TABLET | Freq: Once | ORAL | Status: AC
  Administered 2019-12-15: 650 mg via ORAL
  Filled 2019-12-15: qty 2

## 2019-12-15 MED ORDER — LIDOCAINE HCL (PF) 1 % IJ SOLN
10.0000 mL | Freq: Once | INTRAMUSCULAR | Status: AC
  Administered 2019-12-15: 10 mL
  Filled 2019-12-15: qty 30

## 2019-12-15 NOTE — ED Provider Notes (Signed)
[facility]-EMERGENCY DEPT Provider Note   CSN: [id number] Arrival date & time: 12/15/19  0518     History Chief Complaint  Patient presents with  . Toe Pain    Rian C Gaynelle AduMcCain is a 33 y.o. female with a hx of recurrent abscess, polysubstance abuse presents to the Emergency Department complaining of gradual, persistent, progressively worsening pain and swelling to the left great toe.  Pt reports she was "digging at the cuticle" on 12/11/2019 and has noticed swelling, pain and redness since that time.  Pt reports last night the pain became unbearable, prompting her visit to the ED.  She reports trying warm soaks without relief.  She denies fever, chills, drainage.  Palpation makes her pain worse. No treatments for pain PTA.  The history is provided by the patient and medical records. No language interpreter was used.       Past Medical History:  Diagnosis Date  . Abscess of axilla   . Anemia   . Anxiety   . Depression    bipolar; not on meds  . Headache(784.0)   . Heart murmur    as a child  . Hemorrhoids   . Hypokalemia 06/17/2012  . Perianal abscess 12/2017  . 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  . Substance abuse (HCC)   . Suicide attempt (HCC) 06/19/2012  . UTI (lower urinary tract infection) 06/19/2012    Patient Active Problem List   Diagnosis Date Noted  . Postpartum care and examination 01/09/2019  . ASCUS with positive high risk HPV cervical 11/25/2018  . Rubella non-immune status, antepartum 11/25/2018  . Trichomonas infection 11/25/2018  . Iron deficiency anemia due to chronic blood loss 07/04/2018  . Inadequate social support 07/04/2018  . History of Cocaine abuse affecting pregnancy in third trimester (HCC) 04/2016 04/27/2016  . Tobacco abuse 06/17/2012    Past Surgical History:  Procedure Laterality Date  . DENTAL SURGERY    . INCISE AND DRAIN ABCESS  01/16/2018  . INCISION AND DRAINAGE PERIRECTAL ABSCESS N/A 01/16/2018   Procedure: IRRIGATION AND DEBRIDEMENT PERIRECTAL ABSCESS;  Surgeon: Sheliah HatchKinsinger, De BlanchLuke Aaron, MD;  [facility];  Service: General;  Laterality: N/A;  . LAPAROSCOPIC TUBAL LIGATION Bilateral 02/05/2019   Procedure: LAPAROSCOPIC TUBAL LIGATION WITH CERVICAL POLYPECTOMY;  Surgeon: Allie Bossierove, Myra C, MD;  Location: [facility];  Service: Gynecology;  Laterality: Bilateral;     OB History    Gravida  5   Para  4   Term  3   Preterm  1   AB  1   Living  4     SAB  1   TAB  0   Ectopic  0   Multiple  0   Live Births  4           Family History  Problem Relation Age of Onset  . Hypertension Mother   . Diabetes type II Mother   . Arthritis Mother   . Hyperlipidemia Mother   . Cancer Father        lung    Social History   Tobacco Use  . Smoking status: Current Every Day Smoker    Packs/day: 0.00    Years: 9.00    Pack years: 0.00    Types: Cigarettes    Last attempt to quit: 01/14/2018    Years since quitting: 1.9  . Smokeless tobacco: Never Used  . Tobacco comment: 3 cigarettes per day  Substance Use Topics  . Alcohol use: Not Currently  Comment: rare  . Drug use: Not Currently    Frequency: 3.0 times per week    Types: Marijuana, Cocaine    Comment: last time was 02/05/2019, pt states once a week    Home Medications Prior to Admission medications   Medication Sig Start Date End Date Taking? Authorizing Provider  albuterol (VENTOLIN HFA) 108 (90 Base) MCG/ACT inhaler Inhale 1-2 puffs into the lungs every 6 (six) hours as needed for wheezing or shortness of breath. 07/03/19   Ripley Fraise, MD  doxycycline (VIBRAMYCIN) 100 MG capsule Take 1 capsule (100 mg total) by mouth 2 (two) times daily. 12/15/19   Erika Slaby, Jarrett Soho, PA-C  predniSONE (DELTASONE) 50 MG tablet One tablet PO daily 07/03/19   Ripley Fraise, MD    Allergies    Penicillins  Review of Systems   Review of Systems  Constitutional: Negative for chills and fever.   Musculoskeletal: Positive for arthralgias.  Skin: Positive for color change.    Physical Exam Updated Vital Signs BP 116/82 (BP Location: Right Arm)   Pulse 85   Temp 98.6 F (37 C) (Oral)   Resp 16   Ht 5\' 7"  (1.702 m)   Wt 53.1 kg   LMP 12/15/2019 (Exact Date)   SpO2 100%   BMI 18.32 kg/m   Physical Exam Vitals and nursing note reviewed.  Constitutional:      General: She is not in acute distress.    Appearance: She is well-developed.  HENT:     Head: Normocephalic.  Eyes:     General: No scleral icterus.    Conjunctiva/sclera: Conjunctivae normal.  Cardiovascular:     Rate and Rhythm: Normal rate.     Pulses:          Dorsalis pedis pulses are 2+ on the left side.     Comments: Capillary refill < 2 sec Pulmonary:     Effort: Pulmonary effort is normal.  Musculoskeletal:        General: Normal range of motion.     Cervical back: Normal range of motion.  Skin:    General: Skin is warm and dry.     Findings: Abscess present.     Comments: Paronychia of the left great toe with swelling and fluctuance to the lateral aspect of the nailbed. Pulp is soft.   Neurological:     Mental Status: She is alert.     ED Results / Procedures / Treatments    Procedures .Marland KitchenIncision and Drainage  Date/Time: 12/15/2019 6:35 AM Performed by: Abigail Butts, PA-C Authorized by: Abigail Butts, PA-C   Consent:    Consent obtained:  Verbal   Consent given by:  Patient   Risks discussed:  Bleeding, incomplete drainage, pain and damage to other organs   Alternatives discussed:  No treatment Universal protocol:    Procedure explained and questions answered to patient or proxy's satisfaction: yes     Relevant documents present and verified: yes     Test results available and properly labeled: yes     Imaging studies available: yes     Required blood products, implants, devices, and special equipment available: yes     Site/side marked: yes     Immediately prior to  procedure a time out was called: yes     Patient identity confirmed:  Verbally with patient Location:    Type:  Abscess (paronychia)   Location:  Lower extremity   Lower extremity location:  Toe   Toe location:  L big toe Pre-procedure  details:    Skin preparation:  Betadine Anesthesia (see MAR for exact dosages):    Anesthesia method:  Nerve block   Block needle gauge:  25 G   Block anesthetic:  Lidocaine 1% w/o epi   Block injection procedure:  Introduced needle, negative aspiration for blood, anatomic landmarks palpated and anatomic landmarks identified   Block outcome:  Anesthesia achieved Procedure type:    Complexity:  Complex Procedure details:    Incision types:  Single straight   Incision depth:  Subcutaneous   Scalpel blade:  11   Wound management:  Probed and deloculated, irrigated with saline and extensive cleaning   Drainage:  Purulent   Drainage amount:  Moderate   Wound treatment:  Wound left open Post-procedure details:    Patient tolerance of procedure:  Tolerated well, no immediate complications   (including critical care time)  Medications Ordered in ED Medications  lidocaine (PF) (XYLOCAINE) 1 % injection 10 mL (10 mLs Infiltration Given 12/15/19 0629)  acetaminophen (TYLENOL) tablet 650 mg (650 mg Oral Given 12/15/19 9678)    ED Course  I have reviewed the triage vital signs and the nursing notes.  Pertinent labs & imaging results that were available during my care of the patient were reviewed by me and considered in my medical decision making (see chart for details).    MDM Rules/Calculators/A&P                      Patient presents with paronychia of the left great toe.  I&D with moderate amount of purulent fluid.  No evidence of felon.  Patient with history of immunocompromise.  Afebrile.  No streaking or signs of progressive infection.  Warm soaks recommended.  Will be discharged home with doxy given penicillin allergy and history of recurrent  abscess. Discussed reasons to return to emergency department.  Patient states understanding and is in agreement with the plan.   Final Clinical Impression(s) / ED Diagnoses Final diagnoses:  Paronychia of great toe, left    Rx / DC Orders ED Discharge Orders         Ordered    doxycycline (VIBRAMYCIN) 100 MG capsule  2 times daily     12/15/19 0630           Cynthia Cogle, Boyd Kerbs 12/15/19 9381    Ward, Layla Maw, DO 12/15/19 [id number]

## 2019-12-15 NOTE — Discharge Instructions (Addendum)
1. Medications: doxycycline, usual home medications 2. Treatment: rest, drink plenty of fluids, use warm compresses, flush abscess with warm water several times per day 3. Follow Up: Please followup with your primary doctor in 2-3 days for discussion of your diagnoses and further evaluation after today's visit; if you do not have a primary care doctor use the resource guide provided to find one; Please return to the ER for fevers, chills, nausea, vomiting or other signs of infection

## 2019-12-15 NOTE — ED Triage Notes (Addendum)
Per ems: pt coming from home c/o pain in left foot (big toe) that started christmas eve from a hang nail. Pain is now radiating from toe to ankle. Noted to be swollen.

## 2020-02-01 ENCOUNTER — Other Ambulatory Visit: Payer: Self-pay

## 2020-02-01 ENCOUNTER — Emergency Department (HOSPITAL_COMMUNITY)
Admission: EM | Admit: 2020-02-01 | Discharge: 2020-02-02 | Disposition: A | Discharge status: Home / Self Care | Payer: Medicaid Other | Attending: Emergency Medicine | Admitting: Emergency Medicine

## 2020-02-01 ENCOUNTER — Encounter (HOSPITAL_COMMUNITY): Payer: Self-pay | Admitting: Emergency Medicine

## 2020-02-01 DIAGNOSIS — Z79899 Other long term (current) drug therapy: Secondary | ICD-10-CM | POA: Diagnosis not present

## 2020-02-01 DIAGNOSIS — L02215 Cutaneous abscess of perineum: Secondary | ICD-10-CM

## 2020-02-01 DIAGNOSIS — F17210 Nicotine dependence, cigarettes, uncomplicated: Secondary | ICD-10-CM | POA: Diagnosis not present

## 2020-02-01 DIAGNOSIS — R102 Pelvic and perineal pain: Secondary | ICD-10-CM | POA: Diagnosis not present

## 2020-02-01 LAB — COMPREHENSIVE METABOLIC PANEL
ALT: 18 U/L (ref 0–44)
AST: 16 U/L (ref 15–41)
Albumin: 3.8 g/dL (ref 3.5–5.0)
Alkaline Phosphatase: 56 U/L (ref 38–126)
Anion gap: 6 (ref 5–15)
BUN: 12 mg/dL (ref 6–20)
CO2: 21 mmol/L — ABNORMAL LOW (ref 22–32)
Calcium: 9.3 mg/dL (ref 8.9–10.3)
Chloride: 109 mmol/L (ref 98–111)
Creatinine, Ser: 0.6 mg/dL (ref 0.44–1.00)
GFR calc Af Amer: >60 mL/min (ref >60)
GFR calc non Af Amer: >60 mL/min (ref >60)
Glucose, Bld: 82 mg/dL (ref 70–99)
Potassium: 3.8 mmol/L (ref 3.5–5.1)
Sodium: 136 mmol/L (ref 135–145)
Total Bilirubin: 0.2 mg/dL — ABNORMAL LOW (ref 0.3–1.2)
Total Protein: 7 g/dL (ref 6.5–8.1)

## 2020-02-01 LAB — I-STAT BETA HCG BLOOD, ED (MC, WL, AP ONLY): I-stat hCG, quantitative: <5 m[IU]/mL (ref <5)

## 2020-02-01 MED ORDER — IBUPROFEN 200 MG PO TABS
600.0000 mg | ORAL_TABLET | Freq: Once | ORAL | Status: AC
  Administered 2020-02-01: 600 mg via ORAL
  Filled 2020-02-01: qty 3

## 2020-02-01 NOTE — ED Provider Notes (Signed)
[facility]-EMERGENCY DEPT Provider Note   CSN: [id number] Arrival date & time: 02/01/20  1957     History Chief Complaint  Patient presents with  . Cystitis    [name] [name] is a 34 y.o. female female with a past medical history significant for polysubstance abuse, depression, anxiety, history of abscesses,, and external hemorrhoids who presents to the ED due to gradual onset of worsening abscess located in her perineum. Patient admits to bloody and purulent drainage from site. Pain is worse when sitting down and with palpation. She describes pain as a stinging and burning sensation. She has tried ibuprofen with no relief. LMP was in January.   Chart reviewed.  Patient was seen on 01/16/2018 and was found to have a left perianal abscess that required surgical drainage.  Patient denies fever and chills. Patient is currently sexually active with one partner without protection. Denies vaginal discharge. No concern for STDs at this time.  Denies abdominal pain, nausea, vomiting, diarrhea.      Past Medical History:  Diagnosis Date  . Abscess of axilla   . Anemia   . Anxiety   . Depression    bipolar; not on meds  . Headache(784.0)   . Heart murmur    as a child  . Hemorrhoids   . Hypokalemia 06/17/2012  . Perianal abscess 12/2017  . 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  . Substance abuse (HCC)   . Suicide attempt (HCC) 06/19/2012  . UTI (lower urinary tract infection) 06/19/2012    Patient Active Problem List   Diagnosis Date Noted  . Postpartum care and examination 01/09/2019  . ASCUS with positive high risk HPV cervical 11/25/2018  . Rubella non-immune status, antepartum 11/25/2018  . Trichomonas infection 11/25/2018  . Iron deficiency anemia due to chronic blood loss 07/04/2018  . Inadequate social support 07/04/2018  . History of Cocaine abuse affecting pregnancy in third trimester (HCC) 04/2016 04/27/2016  . Tobacco abuse 06/17/2012    Past Surgical  History:  Procedure Laterality Date  . DENTAL SURGERY    . INCISE AND DRAIN ABCESS  01/16/2018  . INCISION AND DRAINAGE PERIRECTAL ABSCESS N/A 01/16/2018   Procedure: IRRIGATION AND DEBRIDEMENT PERIRECTAL ABSCESS;  Surgeon: Sheliah Hatch, De Blanch, MD;  Location: [facility] OR;  Service: General;  Laterality: N/A;  . LAPAROSCOPIC TUBAL LIGATION Bilateral 02/05/2019   Procedure: LAPAROSCOPIC TUBAL LIGATION WITH CERVICAL POLYPECTOMY;  Surgeon: Allie Bossier, MD;  Location: [facility];  Service: Gynecology;  Laterality: Bilateral;     OB History    Gravida  5   Para  4   Term  3   Preterm  1   AB  1   Living  4     SAB  1   TAB  0   Ectopic  0   Multiple  0   Live Births  4           Family History  Problem Relation Age of Onset  . Hypertension Mother   . Diabetes type II Mother   . Arthritis Mother   . Hyperlipidemia Mother   . Cancer Father        lung    Social History   Tobacco Use  . Smoking status: Current Every Day Smoker    Packs/day: 0.00    Years: 9.00    Pack years: 0.00    Types: Cigarettes    Last attempt to quit: 01/14/2018    Years since quitting: 2.0  .  Smokeless tobacco: Never Used  . Tobacco comment: 3 cigarettes per day  Substance Use Topics  . Alcohol use: Not Currently    Comment: rare  . Drug use: Not Currently    Frequency: 3.0 times per week    Types: Marijuana, Cocaine    Comment: last time was 02/05/2019, pt states once a week    Home Medications Prior to Admission medications   Medication Sig Start Date End Date Taking? Authorizing Provider  albuterol (VENTOLIN HFA) 108 (90 Base) MCG/ACT inhaler Inhale 1-2 puffs into the lungs every 6 (six) hours as needed for wheezing or shortness of breath. 07/03/19   Zadie Rhine, MD  doxycycline (VIBRAMYCIN) 100 MG capsule Take 1 capsule (100 mg total) by mouth 2 (two) times daily. 12/15/19   Muthersbaugh, Dahlia Client, PA-C  predniSONE (DELTASONE) 50 MG tablet One tablet PO daily  07/03/19   Zadie Rhine, MD    Allergies    Penicillins  Review of Systems   Review of Systems  Constitutional: Negative for chills and fever.  Respiratory: Negative for shortness of breath.   Cardiovascular: Negative for chest pain.  Gastrointestinal: Negative for abdominal pain, diarrhea, nausea and vomiting.  Genitourinary: Positive for genital sores and vaginal pain (abscess). Negative for dysuria.  Skin: Positive for wound.  All other systems reviewed and are negative.   Physical Exam Updated Vital Signs BP 104/89   Pulse 84   Temp 98.2 F (36.8 C) (Oral)   Resp 16   SpO2 100%   Physical Exam Vitals and nursing note reviewed. Exam conducted with a chaperone present.  Constitutional:      General: She is not in acute distress.    Appearance: She is not toxic-appearing.  HENT:     Head: Normocephalic.  Eyes:     Pupils: Pupils are equal, round, and reactive to light.  Cardiovascular:     Rate and Rhythm: Normal rate and regular rhythm.     Pulses: Normal pulses.     Heart sounds: Normal heart sounds. No murmur. No friction rub. No gallop.   Pulmonary:     Effort: Pulmonary effort is normal.     Breath sounds: Normal breath sounds.  Abdominal:     General: Abdomen is flat. Bowel sounds are normal. There is no distension.     Palpations: Abdomen is soft.     Tenderness: There is no abdominal tenderness. There is no guarding or rebound.  Genitourinary:    Labia:        Right: No tenderness.        Left: No tenderness.        Comments: Area of induration located in the perineum with a small lesion on the left. No drainage. Tenderness to palpation in area. No tenderness throughout labia.  Musculoskeletal:     Cervical back: Neck supple.     Comments: Able to move all 4 extremities without difficulty.  Lymphadenopathy:     Lower Body: No right inguinal adenopathy. No left inguinal adenopathy.  Skin:    General: Skin is warm and dry.  Neurological:      General: No focal deficit present.     Mental Status: She is alert.  Psychiatric:        Mood and Affect: Mood normal.        Behavior: Behavior normal.     ED Results / Procedures / Treatments   Labs (all labs ordered are listed, but only abnormal results are displayed) Labs Reviewed  CBC WITH  DIFFERENTIAL/PLATELET  COMPREHENSIVE METABOLIC PANEL  I-STAT BETA HCG BLOOD, ED ([facility], [facility], [facility] ONLY)    EKG None  Radiology No results found.  Procedures Procedures (including critical care time)  Medications Ordered in ED Medications  ibuprofen (ADVIL) tablet 600 mg (has no administration in time range)    ED Course  I have reviewed the triage vital signs and the nursing notes.  Pertinent labs & imaging results that were available during my care of the patient were reviewed by me and considered in my medical decision making (see chart for details).    MDM Rules/Calculators/A&P                     34 year old female presents to the ED due to area of irritation located in her perineum x 3 days. Patient has a history of abscesses. She had a perianal abscess on 01/16/18 which required surgical drainage. Patient denies fever and chills.  Stable vitals.  Patient is afebrile, not tachycardic or hypoxic.  Patient no acute distress and nontoxic-appearing.  Area of induration in the perineum with a lesion to the left.  No active draining. Given the area of induration, will obtain CT pelvis for further evaluation. Routine labs placed. Discussed case with Dr. Wyvonnia Dusky who evaluated patient at bedside and agrees with assessment and plan.   CMP reassuring with normal renal function. Pregnancy test negative. CBC significant for hemoglobin at 8.2 which appears to be slightly lower than baseline.   Patient handed off to Charlann Lange, PA-C at shift change who will follow-up on CT scan. If CT scan shows vaginal abscess, consult OBGYN for drainage. If abscess is perianal, consult surgery. If no abscess is  noted on the scan, discharge patient with antibiotics with PCP follow-up.  Final Clinical Impression(s) / ED Diagnoses Final diagnoses:  None    Rx / DC Orders ED Discharge Orders    None       Karie Kirks 02/02/20 0006    Ezequiel Essex, MD 02/02/20 1008

## 2020-02-01 NOTE — ED Triage Notes (Signed)
Pt is from home with c/o peritoneal cyst onset 3 days intermittant  Pus and bloody drainage.

## 2020-02-02 ENCOUNTER — Emergency Department (HOSPITAL_COMMUNITY): Payer: Medicaid Other

## 2020-02-02 LAB — CBC WITH DIFFERENTIAL/PLATELET
Abs Immature Granulocytes: 0.02 10*3/uL (ref 0.00–0.07)
Basophils Absolute: 0 10*3/uL (ref 0.0–0.1)
Basophils Relative: 0 %
Eosinophils Absolute: 0.1 10*3/uL (ref 0.0–0.5)
Eosinophils Relative: 1 %
HCT: 28.2 % — ABNORMAL LOW (ref 36.0–46.0)
Hemoglobin: 8.2 g/dL — ABNORMAL LOW (ref 12.0–15.0)
Immature Granulocytes: 0 %
Lymphocytes Relative: 33 %
Lymphs Abs: 2.9 10*3/uL (ref 0.7–4.0)
MCH: 18.4 pg — ABNORMAL LOW (ref 26.0–34.0)
MCHC: 29.1 g/dL — ABNORMAL LOW (ref 30.0–36.0)
MCV: 63.2 fL — ABNORMAL LOW (ref 80.0–100.0)
Monocytes Absolute: 0.6 10*3/uL (ref 0.1–1.0)
Monocytes Relative: 7 %
Neutro Abs: 5 10*3/uL (ref 1.7–7.7)
Neutrophils Relative %: 59 %
Platelets: 419 10*3/uL — ABNORMAL HIGH (ref 150–400)
RBC: 4.46 MIL/uL (ref 3.87–5.11)
RDW: 21.2 % — ABNORMAL HIGH (ref 11.5–15.5)
WBC: 8.6 10*3/uL (ref 4.0–10.5)
nRBC: 0 % (ref 0.0–0.2)

## 2020-02-02 MED ORDER — IOHEXOL 300 MG/ML  SOLN
100.0000 mL | Freq: Once | INTRAMUSCULAR | Status: AC | PRN
  Administered 2020-02-02: 100 mL via INTRAVENOUS

## 2020-02-02 MED ORDER — SODIUM CHLORIDE 0.9 % IV BOLUS
1000.0000 mL | Freq: Once | INTRAVENOUS | Status: AC
  Administered 2020-02-02: 1000 mL via INTRAVENOUS

## 2020-02-02 MED ORDER — SODIUM CHLORIDE 0.9 % IV SOLN
100.0000 mg | Freq: Once | INTRAVENOUS | Status: AC
  Administered 2020-02-02: 100 mg via INTRAVENOUS
  Filled 2020-02-02: qty 100

## 2020-02-02 MED ORDER — DOXYCYCLINE HYCLATE 100 MG PO CAPS: 100.0000 mg | ORAL_CAPSULE | Freq: Two times a day (BID) | ORAL | 0 refills | Status: DC

## 2020-02-02 MED ORDER — SODIUM CHLORIDE (PF) 0.9 % IJ SOLN
INTRAMUSCULAR | Status: AC
  Filled 2020-02-02: qty 50

## 2020-02-02 MED ORDER — NAPROXEN 375 MG PO TABS: 375.0000 mg | ORAL_TABLET | Freq: Two times a day (BID) | ORAL | 0 refills | Status: DC

## 2020-02-02 NOTE — Discharge Instructions (Addendum)
IT IS VERY IMPORTANT THAT YOU GO TO [facility] FOR FURTHER MANAGEMENT OF YOUR ABSCESS.   Take you Doxycycline for the infection. Naproxen for pain.

## 2020-02-02 NOTE — ED Notes (Signed)
Pt transported to CT ?

## 2020-02-02 NOTE — ED Provider Notes (Signed)
Perineal inflammation x 3 days H/o perianal abscess (01/16/2018)( requiring surgery No fever She states drainage, none on exam CT pelvic pending  1:45 - CT shows a 15 mm fluid collection extending along vaginal canal.   Patient is very sleepy on my re-evaluation. She did not receive sedating medications. Blood pressure is 90/59 after sitting up and waking somewhat. Was 110/65 and 103/45 on arrival. Will give bolus of fluids and IV Doxycycline while getting bolus to start her therapy.   Discussed CT findings with GYN ([phone]) who advises antibiotics and close follow up are appropriate. Will refer to [employer].   Chart reviewed. She has a history of low blood pressures on multiple past charts. Anticipate discharge home after medications.   Additionally, she reports she is currently living in a shelter, but that she has the means to schedule follow up with GYN and obtain her prescriptions.    Elpidio Anis, PA-C 02/02/20 0354    Glynn Octave, MD 02/02/20 1009

## 2020-02-05 ENCOUNTER — Ambulatory Visit: Payer: Medicaid Other | Admitting: Family Medicine

## 2020-02-06 ENCOUNTER — Other Ambulatory Visit: Payer: Self-pay

## 2020-02-06 ENCOUNTER — Encounter: Payer: Self-pay | Admitting: Obstetrics & Gynecology

## 2020-02-06 ENCOUNTER — Ambulatory Visit (INDEPENDENT_AMBULATORY_CARE_PROVIDER_SITE_OTHER): Payer: Medicaid Other | Admitting: Obstetrics & Gynecology

## 2020-02-06 VITALS — BP 101/67 | HR 63 | Ht 67.5 in | Wt 126.0 lb

## 2020-02-06 DIAGNOSIS — K610 Anal abscess: Secondary | ICD-10-CM | POA: Diagnosis present

## 2020-02-06 DIAGNOSIS — R87810 Cervical high risk human papillomavirus (HPV) DNA test positive: Secondary | ICD-10-CM | POA: Diagnosis not present

## 2020-02-06 DIAGNOSIS — R87610 Atypical squamous cells of undetermined significance on cytologic smear of cervix (ASC-US): Secondary | ICD-10-CM

## 2020-02-06 DIAGNOSIS — K649 Unspecified hemorrhoids: Secondary | ICD-10-CM

## 2020-02-06 NOTE — Patient Instructions (Signed)
Anorectal Abscess An abscess is an infected area that contains a collection of pus. An anorectal abscess is an abscess that is near the opening of the anus or around the rectum. Without treatment, an anorectal abscess can become larger and cause other problems, such as a more serious body-wide infection or pain, especially during bowel movements. What are the causes? This condition is caused by plugged glands or an infection in one of these areas:  The anus.  The area between the anus and the scrotum in males or between the anus and the vagina in females (perineum). What increases the risk? The following factors may make you more likely to develop this condition:  Diabetes or inflammatory bowel disease.  Having a body defense system (immune system) that is weak.  Engaging in anal sex.  Having a sexually transmitted infection (STI).  Certain kinds of cancer, such as rectal carcinoma, leukemia, or lymphoma. What are the signs or symptoms? The main symptom of this condition is pain. The pain may be a throbbing pain that gets worse during bowel movements. Other symptoms include:  Swelling and redness in the area of the abscess. The redness may go beyond the abscess and appear as a red streak on the skin.  A visible, painful lump, or a lump that can be felt when touched.  Bleeding or pus-like discharge from the area.  Fever.  General weakness.  Constipation.  Diarrhea. How is this diagnosed? This condition is diagnosed based on your medical history and a physical exam of the affected area.  This may involve examining the rectal area with a gloved hand (digital rectal exam).  Sometimes, the health care provider needs to look into the rectum using a probe, scope, or imaging test.  For women, it may require a careful vaginal exam. How is this treated? Treatment for this condition may include:  Incision and drainage surgery. This involves making an incision over the abscess to  drain the pus.  Medicines, including antibiotic medicine, pain medicine, stool softeners, or laxatives. Follow these instructions at home: Medicines  Take over-the-counter and prescription medicines only as told by your health care provider.  If you were prescribed an antibiotic medicine, use it as told by your health care provider. Do not stop using the antibiotic even if you start to feel better.  Do not drive or use heavy machinery while taking prescription pain medicine. Wound care   If gauze was used in the abscess, follow instructions from your health care provider about removing or changing the gauze. It can usually be removed in 2-3 days.  Wash your hands with soap and water before you remove or change your gauze. If soap and water are not available, use hand sanitizer.  If one or more drains were placed in the abscess cavity, be careful not to pull at them. Your health care provider will tell you how long they need to remain in place.  Check your incision area every day for signs of infection. Check for: ? More redness, swelling, or pain. ? More fluid or blood. ? Warmth. ? Pus or a bad smell. Managing pain, stiffness, and swelling   Take a sitz bath 3-4 times a day and after bowel movements. This will help reduce pain and swelling.  To relieve pain, try sitting: ? On a heating pad with the setting on low. ? On an inflatable donut-shaped cushion.  If directed, put ice on the affected area: ? Put ice in a plastic bag. ? Place   a towel between your skin and the bag. ? Leave the ice on for 20 minutes, 2-3 times a day. General instructions  Follow any diet instructions given by your health care provider.  Keep all follow-up visits as told by your health care provider. This is important. Contact a health care provider if you have:  Bleeding from your incision.  Pain, swelling, or redness that does not improve or gets worse.  Trouble passing stool or  urine.  Symptoms that return after treatment. Get help right away if you:  Have problems moving or using your legs.  Have severe or increasing pain.  Have swelling in the affected area that suddenly gets worse.  Have a large increase in bleeding or passing of pus.  Develop chills or a fever. Summary  An anorectal abscess is an abscess that is near the opening of the anus or around the rectum. An abscess is an infected area that contains a collection of pus.  The main symptom of this condition is pain. It may be a throbbing pain that gets worse during bowel movements.  Treatment for an anorectal abscess may include surgery to drain the pus from the abscess. Medicines and sitz baths may also be a part of your treatment plan. This information is not intended to replace advice given to you by your health care provider. Make sure you discuss any questions you have with your health care provider. Document Revised: 01/10/2018 Document Reviewed: 01/10/2018 Elsevier Patient Education  2020 [employer]  

## 2020-02-06 NOTE — Progress Notes (Signed)
[facility] appt scheduled today at 3:30p, to arrive at 3:15.

## 2020-02-06 NOTE — Progress Notes (Signed)
Patient ID: [name] [name], female   DOB: [birth year], 34 y.o.   MRN: [id number]  Chief Complaint  Patient presents with  . Gynecologic Exam    HPI [name] [name] is a 34 y.o. female.  [id number] Patient's last menstrual period was 12/29/2019 (approximate). She was seen in ED for pain and swelling at the perineum and had CT. She had I&D of anorectal abscess 12/2017 and she feels the sx are a recurrence. There is drainage now and she is on antibiotic.  HPI  Past Medical History:  Diagnosis Date  . Abscess of axilla   . Anemia   . Anxiety   . Depression    bipolar; not on meds  . Headache(784.0)   . Heart murmur    as a child  . Hemorrhoids   . Hypokalemia 06/17/2012  . Perianal abscess 12/2017  . 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  . Substance abuse (HCC)   . Suicide attempt (HCC) 06/19/2012  . UTI (lower urinary tract infection) 06/19/2012    Past Surgical History:  Procedure Laterality Date  . DENTAL SURGERY    . INCISE AND DRAIN ABCESS  01/16/2018  . INCISION AND DRAINAGE PERIRECTAL ABSCESS N/A 01/16/2018   Procedure: IRRIGATION AND DEBRIDEMENT PERIRECTAL ABSCESS;  Surgeon: Sheliah Hatch, De Blanch, MD;  Location: [facility] OR;  Service: General;  Laterality: N/A;  . LAPAROSCOPIC TUBAL LIGATION Bilateral 02/05/2019   Procedure: LAPAROSCOPIC TUBAL LIGATION WITH CERVICAL POLYPECTOMY;  Surgeon: Allie Bossier, MD;  Location: [facility];  Service: Gynecology;  Laterality: Bilateral;    Family History  Problem Relation Age of Onset  . Hypertension Mother   . Diabetes type II Mother   . Arthritis Mother   . Hyperlipidemia Mother   . Cancer Father        lung    Social History Social History   Tobacco Use  . Smoking status: Current Every Day Smoker    Packs/day: 0.00    Years: 9.00    Pack years: 0.00    Types: Cigarettes    Last attempt to quit: 01/14/2018    Years since quitting: 2.0  . Smokeless tobacco: Never Used  . Tobacco comment: 3 cigarettes per day   Substance Use Topics  . Alcohol use: Not Currently    Comment: rare  . Drug use: Not Currently    Frequency: 3.0 times per week    Types: Marijuana, Cocaine    Comment: last time was 02/05/2019, pt states once a week    Allergies  Allergen Reactions  . Penicillins Nausea And Vomiting    Has patient had a PCN reaction causing immediate rash, facial/tongue/throat swelling, SOB or lightheadedness with hypotension: No Has patient had a PCN reaction causing severe rash involving mucus membranes or skin necrosis: No Has patient had a PCN reaction that required hospitalization Yes Has patient had a PCN reaction occurring within the last 10 years: No If all of the above answers are "NO", then may proceed with Cephalosporin use.     Current Outpatient Medications  Medication Sig Dispense Refill  . albuterol (VENTOLIN HFA) 108 (90 Base) MCG/ACT inhaler Inhale 1-2 puffs into the lungs every 6 (six) hours as needed for wheezing or shortness of breath. 6.7 g 1  . doxycycline (VIBRAMYCIN) 100 MG capsule Take 1 capsule (100 mg total) by mouth 2 (two) times daily. 20 capsule 0  . naproxen (NAPROSYN) 375 MG tablet Take 1 tablet (375 mg total) by mouth 2 (two) times daily. 20 tablet  0  . predniSONE (DELTASONE) 50 MG tablet One tablet PO daily (Patient not taking: Reported on 02/06/2020) 4 tablet 0   No current facility-administered medications for this visit.    Review of Systems Review of Systems  Constitutional: Negative.   Gastrointestinal: Positive for rectal pain.       Swelling and hemorrhoid  Genitourinary: Positive for vaginal discharge (from abscess).    Blood pressure 101/67, pulse 63, height 5' 7.5" (1.715 m), weight 126 lb (57.2 kg), last menstrual period 12/29/2019, not currently breastfeeding.  Physical Exam Physical Exam Constitutional:      Appearance: Normal appearance. She is not ill-appearing.  Pulmonary:     Effort: Pulmonary effort is normal.  Abdominal:     General:  Abdomen is flat.     Palpations: Abdomen is soft.  Genitourinary:    Comments: Perianal anterior swelling and tenderness with tract at the surface where drainage occurred Neurological:     Mental Status: She is alert.     Data Reviewed ED note, op note 2019  Assessment Suspect recurrent anorectal abscess, large hemorrhoid  Taking antibiotic Perianal abscess - Plan: Ambulatory referral to General Surgery  Hemorrhoids, unspecified hemorrhoid type - Plan: Ambulatory referral to General Surgery  ASCUS with positive high risk HPV cervical   Plan Referral to CC surgery made Continue antibiotic Need to return for repeat pap    Emeterio Reeve 02/06/2020, 10:35 AM

## 2020-02-17 ENCOUNTER — Ambulatory Visit: Payer: Self-pay | Admitting: Surgery

## 2020-02-17 ENCOUNTER — Encounter: Payer: Self-pay | Admitting: Surgery

## 2020-02-17 DIAGNOSIS — K604 Rectal fistula: Secondary | ICD-10-CM | POA: Insufficient documentation

## 2020-02-17 NOTE — H&P (Signed)
Ladonne C Mancias Documented: 02/17/2020 10:37 AM Location: [facility] Patient #: [id number] DOB: [birth year] Single / Language: Cleophus Molt / Race: Black or African American Female  History of Present Illness Adin Hector MD; 02/17/2020 12:27 PM) The patient is a 34 year old female who presents with anal fistula. Note for "Anal fistula": ` ` ` Patient sent for surgical consultation at the request of Carlena Hurl  Chief Complaint: Abscess between vagina and rectum. Concern for perirectal or perineal abscess/fistula ` ` The patient is a dense-looking female. She had a left anterior perirectal abscess that required incision and drainage with Penrose drain by Dr. Kieth Brightly 2019. She improved went home. Drain was removed. 6 months later she had some discomfort and drainage. Saw Dr. Redmond Pulling when she was in the hospital. He suspected an anal fistula. Patient was to follow up with our group with colorectal surgery. That did not happen. She became pregnant. Patient has noted since that time she's had intermittent swelling and drainage. She will get leaking. Again more intense. [facility]. Concern for possible abscess of the vagina. She saw gynecology. They suspected more of a pararectal abscess or fistula. Surgical consultation requested. She had been on doxycycline the past. She is not on any inbox now.  She comes in today noting that she has drainage. She will change pad a few times a day. She is a has a bowel movement every other day. Has discomfort with bowel movements. She is not on any bowel regimen. She's never had a colonoscopy. No major bleeding. No personal nor family history of GI/colon cancer, inflammatory bowel disease, irritable bowel syndrome, allergy such as Celiac Sprue, dietary/dairy problems, colitis, ulcers nor gastritis. No recent sick contacts/gastroenteritis. No travel outside the country. No changes in diet. No dysphagia to solids or liquids.  No significant heartburn or reflux. No melena, hematemesis, coffee ground emesis. No evidence of prior gastric/peptic ulceration.  (Review of systems as stated in this history (HPI) or in the review of systems. Otherwise all other 12 point ROS are negative) ` ` `  This patient encounter took 30 minutes today to perform the following: obtain history, perform exam, review outside records, interpret tests & imaging, counsel the patient on their diagnosis; and, document this encounter, including findings & plan in the electronic health record (EHR).   Allergies Mammie Lorenzo, LPN; 12/23/9676 93:81 AM) Amoxicillin *PENICILLINS*  Medication History Mammie Lorenzo, LPN; 0/12/7508 25:85 AM) Doxycycline Hyclate (100MG  Tablet, Oral) Active. No Current Medications (Taken starting 02/17/2020) Naproxen (375MG  Tablet, Oral) Active. Medications Reconciled    Vitals Claiborne Billings [facility] LPN; 01/24/7823 23:53 AM) 02/17/2020 10:38 AM Weight: 124.8 lb Height: 67in Body Surface Area: 1.65 m Body Mass Index: 19.55 kg/m  Temp.: 97.33F(Thermal Scan)  Pulse: 82 (Regular)  BP: 126/78 (Sitting, Left Arm, Standard)        Physical Exam Adin Hector MD; 02/17/2020 10:54 AM)  General Mental Status-Alert. General Appearance-Not in acute distress, Not Sickly. Orientation-Oriented X3. Hydration-Well hydrated. Voice-Normal.  Integumentary Global Assessment Upon inspection and palpation of skin surfaces of the - Axillae: non-tender, no inflammation or ulceration, no drainage. and Distribution of scalp and body hair is normal. General Characteristics Temperature - normal warmth is noted.  Head and Neck Head-normocephalic, atraumatic with no lesions or palpable masses. Face Global Assessment - atraumatic, no absence of expression. Neck Global Assessment - no abnormal movements, no bruit auscultated on the right, no bruit auscultated on the left, no decreased range of  motion, non-tender. Trachea-midline. Thyroid  Gland Characteristics - non-tender.  Eye Eyeball - Left-Extraocular movements intact, No Nystagmus - Left. Eyeball - Right-Extraocular movements intact, No Nystagmus - Right. Cornea - Left-No Hazy - Left. Cornea - Right-No Hazy - Right. Sclera/Conjunctiva - Left-No scleral icterus, No Discharge - Left. Sclera/Conjunctiva - Right-No scleral icterus, No Discharge - Right. Pupil - Left-Direct reaction to light normal. Pupil - Right-Direct reaction to light normal.  ENMT Ears Pinna - Left - no drainage observed, no generalized tenderness observed. Pinna - Right - no drainage observed, no generalized tenderness observed. Nose and Sinuses External Inspection of the Nose - no destructive lesion observed. Inspection of the nares - Left - quiet respiration. Inspection of the nares - Right - quiet respiration. Mouth and Throat Lips - Upper Lip - no fissures observed, no pallor noted. Lower Lip - no fissures observed, no pallor noted. Nasopharynx - no discharge present. Oral Cavity/Oropharynx - Tongue - no dryness observed. Oral Mucosa - no cyanosis observed. Hypopharynx - no evidence of airway distress observed.  Chest and Lung Exam Inspection Movements - Normal and Symmetrical. Accessory muscles - No use of accessory muscles in breathing. Palpation Palpation of the chest reveals - Non-tender. Auscultation Breath sounds - Normal and Clear.  Cardiovascular Auscultation Rhythm - Regular. Murmurs & Other Heart Sounds - Auscultation of the heart reveals - No Murmurs and No Systolic Clicks.  Abdomen Inspection Inspection of the abdomen reveals - No Visible peristalsis and No Abnormal pulsations. Umbilicus - No Bleeding, No Urine drainage. Palpation/Percussion Palpation and Percussion of the abdomen reveal - Soft, Non Tender, No Rebound tenderness, No Rigidity (guarding) and No Cutaneous hyperesthesia. Note: Abdomen soft. Not  severely distended. No distasis recti. No umbilical or other anterior abdominal wall hernias  Female Genitourinary Sexual Maturity Tanner 5 - Adult hair pattern. Note: At the base of the introitus in the anterior perineum is a 15 x 10 mm swollen region. No active drainage but very sensitive. Short midline raphae. Inner and outer labia without any obvious inflammation. No abnormalities of the rectovaginal septum without any obvious rectocele or cystocele. No vaginal bleeding nor discharge  Rectal Note: Large right anterior external hemorrhoid. Not thrombosed.  Left anterior 3 mm nodule suspicious for sinus of anal fistula.  No obvious fissure. No obvious undrained abscess. Tolerates digital exam but sensitive. No murmur sphincter tone. Tenderness in anterior anal canal but no discrete abscess felt. I cannot get any obvious purulence to express. Held off on anoscopy exam  Peripheral Vascular Upper Extremity Inspection - Left - No Cyanotic nailbeds - Left, Not Ischemic. Inspection - Right - No Cyanotic nailbeds - Right, Not Ischemic.  Neurologic Neurologic evaluation reveals -normal attention span and ability to concentrate, able to name objects and repeat phrases. Appropriate fund of knowledge , normal sensation and normal coordination. Mental Status Affect - not angry, not paranoid. Cranial Nerves-Normal Bilaterally. Gait-Normal.  Neuropsychiatric Mental status exam performed with findings of-able to articulate well with normal speech/language, rate, volume and coherence, thought content normal with ability to perform basic computations and apply abstract reasoning and no evidence of hallucinations, delusions, obsessions or homicidal/suicidal ideation.  Musculoskeletal Global Assessment Spine, Ribs and Pelvis - no instability, subluxation or laxity. Right Upper Extremity - no instability, subluxation or laxity.  Lymphatic Head & Neck  General Head & Neck Lymphatics:  Bilateral - Description - No Localized lymphadenopathy. Axillary  General Axillary Region: Bilateral - Description - No Localized lymphadenopathy. Femoral & Inguinal  Generalized Femoral & Inguinal Lymphatics: Left - Description -  No Localized lymphadenopathy. Right - Description - No Localized lymphadenopathy.    Assessment & Plan Ardeth Sportsman MD; 02/17/2020 12:31 PM)  PERINEAL ABSCESS [phone]) Impression: Abscess of perineum in the anterior perirectal region abutting the introitus. I do not think she is a true rectovaginal fistula. She has a chronic left anterior fistula and now with some pustular formation to the midline now from her prior scarring.  Placed on Cipro and Flagyl antibiotics. Plan examination under anesthesia as noted below  Current Plans Started Cipro 500 MG Oral Tablet, 1 (one) Tablet two times daily, #14, 7 days starting 02/17/2020, Ref. x1. Started metroNIDAZOLE 500 MG Oral Tablet, 1 (one) Tablet three times daily, #21, 02/17/2020, Ref. x1.  ANAL FISTULA (K60.3) Impression: Persistent perirectal drainage with history of prior perirectal abscess left anterior. Swelling going up to her left posterior introitus as well as left pararectal region suspicious for fistula in ano. History of intermittent pain and swelling since summer 2019 suspicious for that as well.  I think she would benefit from examination under anesthesia and unroofing of the abscesses.  I would start her on Cipro and Flagyl to hopefully cool down her recurrent chronic irritation. See if we can minimize infection. Hopefully can try and do a fistulotomy or lift repair if she truly has an intersphincteric fistula. If to inflamed or complicated, may place seton and then plan second operation to get this under control. I explained the pathophysiology and natural history. It seemed to make more sense to her.  I strongly recommend she quit smoking to minimize wound complications and recurrence of  fistulas/abscesses  Current Plans You are being scheduled for surgery- Our schedulers will call you.  You should hear from our office's scheduling department within 5 working days about the location, date, and time of surgery. We try to make accommodations for patient's preferences in scheduling surgery, but sometimes the OR schedule or the surgeon's schedule prevents [name] from making those accommodations.  If you have not heard from our office [phone]) in 5 working days, call the office and ask for your surgeon's nurse.  If you have other questions about your diagnosis, plan, or surgery, call the office and ask for your surgeon's nurse.  The anatomy & physiology of the anorectal region was discussed. We discussed the pathophysiology of anorectal abscess and fistula. Differential diagnosis was discussed. Natural history progression was discussed. I stressed the importance of a bowel regimen to have daily soft bowel movements to minimize progression of disease.  The patient's condition is not adequately controlled. Non-operative treatment has not healed the fistula. Therefore, I recommended examination under anaesthesia to confirm the diagnosis and treat the fistula. I discussed techniques that may be required such as fistulotomy, ligation by LIFT technique, and/or seton placement. Benefits & alternatives discussed. I noted a good likelihood this will help address the problem, but sometimes repeat operations and prolonged healing times may occur. Risks such as bleeding, pain, recurrence, reoperation, incontinence, heart attack, death, and other risks were discussed.  Educational handouts further explaining the pathology, treatment options, and bowel regimen were given. The patient expressed understanding & wishes to proceed. We will work to coordinate surgery for a mutually convenient time.  Pt Education - CCS Abscess/Fistula (AT): discussed with patient and provided  information.  PROLAPSED INTERNAL HEMORRHOIDS, GRADE 4 (K64.3) Impression: Large right anterior chronically prolapsed hemorrhoid with tag. We'll try and do hemorrhoidal pexy and possible excision at the time of fistula repair as long as I do not cause  any new significant scarring. She would like to try and get rid of the hemorrhoid as well possible.  The anatomy & physiology of the anorectal region was discussed. The pathophysiology of hemorrhoids and differential diagnosis was discussed. Natural history progression was discussed. I stressed the importance of a bowel regimen to have daily soft bowel movements to minimize progression of disease. Goal of one BM / day ideal. Use of wet wipes, warm baths, avoiding straining, etc were emphasized.  Educational handouts further explaining the pathology, treatment options, and bowel regimen were given as well. The patient expressed understanding.  Current Plans You are being scheduled for surgery- Our schedulers will call you.  You should hear from our office's scheduling department within 5 working days about the location, date, and time of surgery. We try to make accommodations for patient's preferences in scheduling surgery, but sometimes the OR schedule or the surgeon's schedule prevents [name] from making those accommodations.  If you have not heard from our office [phone]) in 5 working days, call the office and ask for your surgeon's nurse.  If you have other questions about your diagnosis, plan, or surgery, call the office and ask for your surgeon's nurse.  The anatomy & physiology of the anorectal region was discussed. The pathophysiology of hemorrhoids and differential diagnosis was discussed. Natural history risks without surgery was discussed. I stressed the importance of a bowel regimen to have daily soft bowel movements to minimize progression of disease. Interventions such as sclerotherapy & banding were discussed.  The patient's  symptoms are not adequately controlled by medicines and other non-operative treatments. I feel the risks & problems of no surgery outweigh the operative risks; therefore, I recommended surgery to treat the hemorrhoids by ligation, pexy, and possible resection.  Risks such as bleeding, infection, urinary difficulties, need for further treatment, heart attack, death, and other risks were discussed. I noted a good likelihood this will help address the problem. Goals of post-operative recovery were discussed as well. Possibility that this will not correct all symptoms was explained. Post-operative pain, bleeding, constipation, and other problems after surgery were discussed. We will work to minimize complications. Educational handouts further explaining the pathology, treatment options, and bowel regimen were given as well. Questions were answered. The patient expresses understanding & wishes to proceed with surgery.  Pt Education - [facility] Hemorrhoids (Jujhar Everett): discussed with patient and provided information.  ENCOUNTER FOR PREOPERATIVE EXAMINATION FOR GENERAL SURGICAL PROCEDURE (Z01.818)  Current Plans Pt Education - [facility] Rectal Prep for Anorectal outpatient/office surgery: discussed with patient and provided information. Pt Education - [facility] Rectal Surgery HCI (Trenyce Loera): discussed with patient and provided information. Pt Education - [facility] Good Bowel Health (Lily Kernen)

## 2020-02-26 ENCOUNTER — Encounter (HOSPITAL_BASED_OUTPATIENT_CLINIC_OR_DEPARTMENT_OTHER): Payer: Self-pay | Admitting: Surgery

## 2020-02-26 ENCOUNTER — Other Ambulatory Visit: Payer: Self-pay

## 2020-02-26 NOTE — Progress Notes (Signed)
Spoke w/ via phone for pre-op interview---[facility] needs dos---- urine preg             Lab results------ COVID test ------02-28-2020 Arrive at -------100 pm 03-03-2020 No food after midnight, clear liquids until 900 am then nothing by mouth- Medications to take morning of surgery -----none Diabetic medication -----n/a Patient Special Instructions -----follow all bowel prep instructions from dr gross Pre-Op special Istructions ----- Patient verbalized understanding of instructions that were given at this phone interview. Patient denies shortness of breath, chest pain, fever, cough a this phone interview.

## 2020-02-26 NOTE — Progress Notes (Signed)
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 Called patient at [phone] and made aware surgery needs to be postponed and asked patient if she could obtain an covid test at a community testing site asap and she replied "no" and hung up the phone. Called patient back at cell number and no answer.

## 2020-02-28 ENCOUNTER — Other Ambulatory Visit: Payer: Self-pay

## 2020-02-28 ENCOUNTER — Ambulatory Visit (HOSPITAL_COMMUNITY)
Admission: EM | Admit: 2020-02-28 | Discharge: 2020-02-28 | Disposition: A | Discharge status: Home / Self Care | Payer: Medicaid Other | Attending: Emergency Medicine | Admitting: Emergency Medicine

## 2020-02-28 ENCOUNTER — Other Ambulatory Visit (HOSPITAL_COMMUNITY): Payer: Medicaid Other

## 2020-02-28 ENCOUNTER — Encounter (HOSPITAL_COMMUNITY): Payer: Self-pay | Admitting: Gynecology

## 2020-02-28 DIAGNOSIS — U071 COVID-19: Secondary | ICD-10-CM | POA: Insufficient documentation

## 2020-02-28 DIAGNOSIS — R519 Headache, unspecified: Secondary | ICD-10-CM

## 2020-02-28 DIAGNOSIS — R0981 Nasal congestion: Secondary | ICD-10-CM

## 2020-02-28 DIAGNOSIS — R0602 Shortness of breath: Secondary | ICD-10-CM

## 2020-02-28 DIAGNOSIS — R430 Anosmia: Secondary | ICD-10-CM | POA: Diagnosis not present

## 2020-02-28 DIAGNOSIS — R432 Parageusia: Secondary | ICD-10-CM

## 2020-02-28 DIAGNOSIS — Z20822 Contact with and (suspected) exposure to covid-19: Secondary | ICD-10-CM

## 2020-02-28 DIAGNOSIS — B349 Viral infection, unspecified: Secondary | ICD-10-CM | POA: Diagnosis not present

## 2020-02-28 HISTORY — DX: COVID-19: U07.1

## 2020-02-28 NOTE — ED Provider Notes (Signed)
[place]    CSN: [id number] Arrival date & time: 02/28/20  1136      History   Chief Complaint Chief Complaint  Patient presents with  . Covid Exposure    HPI [name] [name] is a 34 y.o. female.   Patient presents today for concern of Covid exposure with onset of decreased taste and smell.  She reports this started approximately 3 to 4 days ago.  She also reports some mild shortness of breath.  She reports that shortness of breath is intermittent.  She denies chest pain.  She reports some nasal congestion and a headache as well.  She denies nausea, vomiting, diarrhea, cough.  She denies fever and chills.  Denies fatigue and body ache.  Patient reports she lives in a shelter in [place] with her 2 young children.  She had questions about what to do if she is Covid positive.  We discussed that she should reach out to the shelter management and social work on site to discuss her contingency planning.     Past Medical History:  Diagnosis Date  . Abscess of axilla   . Anal fissure 2014  . Anemia   . Anxiety   . Bipolar 1 disorder ([place])   . Chronic bronchitis ([place])   . Depression    bipolar; not on meds  . Headache(784.0)    migraine  . Heart murmur    as a child none now  . Hemorrhoids   . Hypokalemia 06/17/2012  . Perianal abscess 12/2017  . Severe major depression ([place]) 06/17/2012  . Substance abuse ([place])   . Suicide attempt ([place]) 06/19/2012  . UTI (lower urinary tract infection) 06/19/2012    Patient Active Problem List   Diagnosis Date Noted  . 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 Hemorrhoids   . ASCUS with positive high risk HPV cervical 11/25/2018  . Rubella non-immune status, antepartum 11/25/2018  . Trichomonas infection 11/25/2018  . Iron deficiency anemia due to chronic blood loss 07/04/2018  . Perianal abscess 07/04/2018  . Inadequate social support 07/04/2018  . 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 Tobacco abuse 06/17/2012    Past Surgical History:  Procedure Laterality Date  . DENTAL SURGERY    . INCISE AND DRAIN ABCESS  01/16/2018  . INCISION AND DRAINAGE PERIRECTAL ABSCESS N/A 01/16/2018   Procedure: IRRIGATION AND DEBRIDEMENT PERIRECTAL ABSCESS;  Surgeon: Kieth Brightly, Arta Bruce, MD;  Location: [place];  Service: General;  Laterality: N/A;  . LAPAROSCOPIC TUBAL LIGATION Bilateral 02/05/2019   Procedure: LAPAROSCOPIC TUBAL LIGATION WITH CERVICAL POLYPECTOMY;  Surgeon: Emily Filbert, MD;  Location: [place];  Service: Gynecology;  Laterality: Bilateral;    OB History    Gravida  5   Para  4   Term  3   Preterm  1   AB  1   Living  4     SAB  1   TAB  0   Ectopic  0   Multiple  0   Live Births  4            Home Medications    Prior to Admission medications   Medication Sig Start Date End Date Taking? Authorizing Provider  metroNIDAZOLE (FLAGYL) 500 MG tablet Take 500 mg by mouth 3 (three) times daily. X 7 days   Yes [provider]  naproxen (NAPROSYN) 375 MG tablet Take 1 tablet (375 mg total) by mouth 2 (  two) times daily. 02/02/20  Yes Upstill, Melvenia Beam, PA-C  albuterol (VENTOLIN HFA) 108 (90 Base) MCG/ACT inhaler Inhale 1-2 puffs into the lungs every 6 (six) hours as needed for wheezing or shortness of breath. Patient taking differently: Inhale 1-2 puffs into the lungs every 6 (six) hours as needed for wheezing or shortness of breath. Out of inhaler 07/03/19   Zadie Rhine, MD    Family History Family History  Problem Relation Age of Onset  . Hypertension Mother   . Diabetes type II Mother   . Arthritis Mother   . Hyperlipidemia Mother   . Cancer Father        lung    Social History Social History   Tobacco Use  . Smoking status: Current Every Day Smoker    Packs/day: 0.00    Years: 9.00    Pack years: 0.00    Types: Cigarettes    Last attempt to quit: 01/14/2018    Years since quitting: 2.1  .  Smokeless tobacco: Never Used  . Tobacco comment: 3 cigarettes per day  Substance Use Topics  . Alcohol use: Not Currently    Comment: rare  . Drug use: Not Currently    Frequency: 3.0 times per week    Types: Marijuana, Cocaine    Comment: last time was 2019 for cocaine, none since occ marijuana     Allergies   Penicillins   Review of Systems Review of Systems  Constitutional: Negative for chills, fatigue and fever.  HENT: Positive for congestion. Negative for ear pain, rhinorrhea, sinus pressure, sinus pain and sore throat.   Eyes: Negative for pain and visual disturbance.  Respiratory: Positive for shortness of breath. Negative for cough.   Cardiovascular: Negative for chest pain and palpitations.  Gastrointestinal: Negative for abdominal pain, diarrhea, nausea and vomiting.  Musculoskeletal: Negative for arthralgias, back pain and myalgias.  Skin: Negative for color change and rash.  Neurological: Negative for seizures, syncope and headaches.  All other systems reviewed and are negative.    Physical Exam Triage Vital Signs ED Triage Vitals  Enc Vitals Group     BP 02/28/20 1248 106/65     Pulse Rate 02/28/20 1248 68     Resp 02/28/20 1248 16     Temp 02/28/20 1248 98.5 F (36.9 C)     Temp Source 02/28/20 1248 Oral     SpO2 02/28/20 1248 100 %     Weight 02/28/20 1251 125 lb (56.7 kg)     Height 02/28/20 1251 5\' 7"  (1.702 m)     Head Circumference --      Peak Flow --      Pain Score 02/28/20 1250 0     Pain Loc --      Pain Edu? --      Excl. in GC? --    No data found.  Updated Vital Signs BP 106/65 (BP Location: Left Arm)   Pulse 68   Temp 98.5 F (36.9 C) (Oral)   Resp 16   Ht 5\' 7"  (1.702 m)   Wt 125 lb (56.7 kg)   LMP 01/16/2020   SpO2 100%   BMI 19.58 kg/m   Visual Acuity Right Eye Distance:   Left Eye Distance:   Bilateral Distance:    Right Eye Near:   Left Eye Near:    Bilateral Near:     Physical Exam Vitals and nursing note  reviewed.  Constitutional:      General: She is not in acute  distress.    Appearance: She is well-developed. She is not ill-appearing.  HENT:     Head: Normocephalic and atraumatic.     Right Ear: Tympanic membrane normal.     Left Ear: Tympanic membrane normal.     Nose:     Comments: Mild erythema of the turbinates with some swelling.  No mucopurulent discharge    Mouth/Throat:     Mouth: Mucous membranes are moist.     Pharynx: Oropharynx is clear.  Eyes:     Extraocular Movements: Extraocular movements intact.     Conjunctiva/sclera: Conjunctivae normal.     Pupils: Pupils are equal, round, and reactive to light.  Cardiovascular:     Rate and Rhythm: Normal rate and regular rhythm.     Heart sounds: No murmur.  Pulmonary:     Effort: Pulmonary effort is normal. No respiratory distress.     Breath sounds: Normal breath sounds.  Abdominal:     Palpations: Abdomen is soft.  Musculoskeletal:     Cervical back: Neck supple.  Skin:    General: Skin is warm and dry.  Neurological:     General: No focal deficit present.     Mental Status: She is alert and oriented to person, place, and time.      UC Treatments / Results  Labs (all labs ordered are listed, but only abnormal results are displayed) Labs Reviewed  SARS CORONAVIRUS 2 (TAT 6-24 HRS)    EKG   Radiology No results found.  Procedures Procedures (including critical care time)  Medications Ordered in UC Medications - No data to display  Initial Impression / Assessment and Plan / UC Course  I have reviewed the triage vital signs and the nursing notes.  Pertinent labs & imaging results that were available during my care of the patient were reviewed by me and considered in my medical decision making (see chart for details).     #Viral illness Patient is 34 year old presenting for Covid testing due to loss of taste and smell with other upper respiratory symptoms.  She is overall well today and afebrile.   Covid PCR was sent discussed following up with social work at her shelter discuss continuously plans should she have a Covid positive.  Discussed symptomatic care with Tylenol or ibuprofen for headache and body aches.  Patient is agreeable to plan. ED precautions were discussed Final Clinical Impressions(s) / UC Diagnoses   Final diagnoses:  Viral illness  Encounter for laboratory testing for COVID-19 virus     Discharge Instructions     Take tylenol or ibuprofen for your headache Contact your social worker to discuss COVID isolation options at the shelter   If your Covid-19 test is positive, you will receive a phone call from [facility] regarding your results. Negative test results are not called. Both positive and negative results area always visible on MyChart. If you do not have a MyChart account, sign up instructions are in your discharge papers.   Persons who are directed to care for themselves at home may discontinue isolation under the following conditions:  . At least 10 days have passed since symptom onset and . At least 24 hours have passed without running a fever (this means without the use of fever-reducing medications) and . Other symptoms have improved.  Persons infected with COVID-19 who never develop symptoms may discontinue isolation and other precautions 10 days after the date of their first positive COVID-19 test.     ED Prescriptions  None     PDMP not reviewed this encounter.   Hermelinda Medicus, PA-C 02/28/20 1341

## 2020-02-28 NOTE — ED Triage Notes (Signed)
Per patient loss of taste/smell x 4 days ago/ Patient stated lives at the shelter in [facility].

## 2020-02-28 NOTE — Discharge Instructions (Addendum)
Take tylenol or ibuprofen for your headache Contact your social worker to discuss COVID isolation options at the shelter   If your Covid-19 test is positive, you will receive a phone call from [facility] regarding your results. Negative test results are not called. Both positive and negative results area always visible on MyChart. If you do not have a MyChart account, sign up instructions are in your discharge papers.   Persons who are directed to care for themselves at home may discontinue isolation under the following conditions:   At least 10 days have passed since symptom onset and  At least 24 hours have passed without running a fever (this means without the use of fever-reducing medications) and  Other symptoms have improved.  Persons infected with COVID-19 who never develop symptoms may discontinue isolation and other precautions 10 days after the date of their first positive COVID-19 test.

## 2020-02-29 LAB — SARS CORONAVIRUS 2 (TAT 6-24 HRS): SARS Coronavirus 2: POSITIVE — AB

## 2020-03-01 ENCOUNTER — Telehealth (HOSPITAL_COMMUNITY): Payer: Self-pay

## 2020-03-01 ENCOUNTER — Other Ambulatory Visit (HOSPITAL_COMMUNITY): Payer: Medicaid Other

## 2020-03-03 ENCOUNTER — Ambulatory Visit (HOSPITAL_BASED_OUTPATIENT_CLINIC_OR_DEPARTMENT_OTHER): Admission: RE | Admit: 2020-03-03 | Payer: Medicaid Other | Source: Home / Self Care | Admitting: Surgery

## 2020-03-03 HISTORY — DX: Bipolar disorder, unspecified: F31.9

## 2020-03-03 HISTORY — DX: Unspecified chronic bronchitis: J42

## 2020-03-03 SURGERY — EXAM UNDER ANESTHESIA WITH ANAL FISTULECTOMY
Anesthesia: General

## 2020-03-05 ENCOUNTER — Telehealth (HOSPITAL_COMMUNITY): Payer: Self-pay | Admitting: *Deleted

## 2020-03-05 NOTE — Telephone Encounter (Signed)
Attempted to reach patient x 3 to discuss COVID results, VM left.

## 2020-03-22 ENCOUNTER — Encounter (HOSPITAL_BASED_OUTPATIENT_CLINIC_OR_DEPARTMENT_OTHER): Payer: Self-pay | Admitting: Surgery

## 2020-03-22 ENCOUNTER — Other Ambulatory Visit (HOSPITAL_COMMUNITY): Payer: Medicaid Other

## 2020-03-22 ENCOUNTER — Other Ambulatory Visit: Payer: Self-pay

## 2020-03-22 NOTE — Progress Notes (Addendum)
Spoke w/ via phone for pre-op interview---PATIENT Lab needs dos----URINE POCT               COVID test ------POSITIVE COVID TEST 02-28-2020, NO RETEST NEEDED lAB RESULT: CHEST XRAY 07-03-2019 EPIC Arrive at -------1000 NPO after ------MIDNIGHT Medications to take morning of surgery -----NONE Diabetic medication -----N/A Patient Special Instructions -----FOLLOW ALL BOWEL PREP INSTRUCTIONS FROM DR GROSS Pre-Op special Istructions ----- Patient verbalized understanding of instructions that were given at this phone interview. Patient denies shortness of breath, chest pain, fever, cough a this phone interview.

## 2020-03-25 ENCOUNTER — Encounter (HOSPITAL_BASED_OUTPATIENT_CLINIC_OR_DEPARTMENT_OTHER): Payer: Self-pay | Admitting: Surgery

## 2020-03-25 ENCOUNTER — Encounter: Payer: Self-pay | Admitting: Surgery

## 2020-03-25 ENCOUNTER — Ambulatory Visit (HOSPITAL_BASED_OUTPATIENT_CLINIC_OR_DEPARTMENT_OTHER): Payer: Medicaid Other | Admitting: Anesthesiology

## 2020-03-25 ENCOUNTER — Ambulatory Visit (HOSPITAL_BASED_OUTPATIENT_CLINIC_OR_DEPARTMENT_OTHER)
Admission: RE | Admit: 2020-03-25 | Discharge: 2020-03-25 | Disposition: A | Discharge status: Home / Self Care | Payer: Medicaid Other | Attending: Surgery | Admitting: Surgery

## 2020-03-25 ENCOUNTER — Encounter (HOSPITAL_BASED_OUTPATIENT_CLINIC_OR_DEPARTMENT_OTHER)
Admission: RE | Disposition: A | Discharge status: Home / Self Care | Payer: Self-pay | Source: Home / Self Care | Attending: Surgery

## 2020-03-25 DIAGNOSIS — Z8616 Personal history of COVID-19: Secondary | ICD-10-CM | POA: Insufficient documentation

## 2020-03-25 DIAGNOSIS — K644 Residual hemorrhoidal skin tags: Secondary | ICD-10-CM | POA: Diagnosis not present

## 2020-03-25 DIAGNOSIS — N751 Abscess of Bartholin's gland: Secondary | ICD-10-CM | POA: Insufficient documentation

## 2020-03-25 DIAGNOSIS — K648 Other hemorrhoids: Secondary | ICD-10-CM | POA: Diagnosis present

## 2020-03-25 DIAGNOSIS — K604 Rectal fistula: Secondary | ICD-10-CM | POA: Diagnosis present

## 2020-03-25 DIAGNOSIS — K643 Fourth degree hemorrhoids: Secondary | ICD-10-CM | POA: Insufficient documentation

## 2020-03-25 DIAGNOSIS — K603 Anal fistula: Secondary | ICD-10-CM | POA: Insufficient documentation

## 2020-03-25 DIAGNOSIS — K610 Anal abscess: Secondary | ICD-10-CM | POA: Diagnosis present

## 2020-03-25 DIAGNOSIS — F17210 Nicotine dependence, cigarettes, uncomplicated: Secondary | ICD-10-CM | POA: Diagnosis not present

## 2020-03-25 DIAGNOSIS — K649 Unspecified hemorrhoids: Secondary | ICD-10-CM | POA: Diagnosis present

## 2020-03-25 HISTORY — PX: RECTAL EXAM UNDER ANESTHESIA: SHX6399

## 2020-03-25 HISTORY — PX: HEMORRHOID SURGERY: SHX153

## 2020-03-25 LAB — POCT I-STAT, CHEM 8
BUN: 7 mg/dL (ref 6–20)
Calcium, Ion: 1.28 mmol/L (ref 1.15–1.40)
Chloride: 105 mmol/L (ref 98–111)
Creatinine, Ser: 0.5 mg/dL (ref 0.44–1.00)
Glucose, Bld: 88 mg/dL (ref 70–99)
HCT: 31 % — ABNORMAL LOW (ref 36.0–46.0)
Hemoglobin: 10.5 g/dL — ABNORMAL LOW (ref 12.0–15.0)
Potassium: 3.8 mmol/L (ref 3.5–5.1)
Sodium: 140 mmol/L (ref 135–145)
TCO2: 22 mmol/L (ref 22–32)

## 2020-03-25 LAB — POCT PREGNANCY, URINE: Preg Test, Ur: NEGATIVE

## 2020-03-25 SURGERY — EXAM UNDER ANESTHESIA, RECTUM
Anesthesia: General | Site: Rectum

## 2020-03-25 MED ORDER — OXYCODONE HCL 5 MG PO TABS: 5.0000 mg | ORAL_TABLET | Freq: Four times a day (QID) | ORAL | 0 refills | Status: DC | PRN

## 2020-03-25 MED ORDER — NAPROXEN 500 MG PO TABS: 500.0000 mg | ORAL_TABLET | Freq: Two times a day (BID) | ORAL | 1 refills | Status: DC | PRN

## 2020-03-25 MED ORDER — LIDOCAINE 2% (20 MG/ML) 5 ML SYRINGE
INTRAMUSCULAR | Status: AC
  Filled 2020-03-25: qty 5

## 2020-03-25 MED ORDER — GENTAMICIN SULFATE 40 MG/ML IJ SOLN
5.0000 mg/kg | INTRAVENOUS | Status: AC
  Administered 2020-03-25: 11:00:00 290 mg via INTRAVENOUS
  Filled 2020-03-25: qty 7.25

## 2020-03-25 MED ORDER — FENTANYL CITRATE (PF) 100 MCG/2ML IJ SOLN
INTRAMUSCULAR | Status: AC
  Filled 2020-03-25: qty 2

## 2020-03-25 MED ORDER — CHLORHEXIDINE GLUCONATE CLOTH 2 % EX PADS
6.0000 | MEDICATED_PAD | Freq: Once | CUTANEOUS | Status: DC
  Filled 2020-03-25: qty 6

## 2020-03-25 MED ORDER — ROCURONIUM BROMIDE 10 MG/ML (PF) SYRINGE
PREFILLED_SYRINGE | INTRAVENOUS | Status: AC
  Filled 2020-03-25: qty 10

## 2020-03-25 MED ORDER — PROPOFOL 10 MG/ML IV BOLUS
INTRAVENOUS | Status: DC | PRN
  Administered 2020-03-25: 130 mg via INTRAVENOUS

## 2020-03-25 MED ORDER — ACETAMINOPHEN 500 MG PO TABS
ORAL_TABLET | ORAL | Status: AC
  Filled 2020-03-25: qty 2

## 2020-03-25 MED ORDER — MIDAZOLAM HCL 2 MG/2ML IJ SOLN
INTRAMUSCULAR | Status: AC
  Filled 2020-03-25: qty 2

## 2020-03-25 MED ORDER — CLINDAMYCIN PHOSPHATE 900 MG/50ML IV SOLN
900.0000 mg | INTRAVENOUS | Status: AC
  Administered 2020-03-25: 11:00:00 15 mg via INTRAVENOUS
  Filled 2020-03-25: qty 50

## 2020-03-25 MED ORDER — OXYCODONE HCL 5 MG PO TABS
ORAL_TABLET | ORAL | Status: AC
  Filled 2020-03-25: qty 1

## 2020-03-25 MED ORDER — CLINDAMYCIN PHOSPHATE 900 MG/50ML IV SOLN
INTRAVENOUS | Status: AC
  Filled 2020-03-25: qty 50

## 2020-03-25 MED ORDER — ONDANSETRON HCL 4 MG/2ML IJ SOLN
INTRAMUSCULAR | Status: DC | PRN
  Administered 2020-03-25: 4 mg via INTRAVENOUS

## 2020-03-25 MED ORDER — DIBUCAINE (PERIANAL) 1 % EX OINT
TOPICAL_OINTMENT | CUTANEOUS | Status: DC | PRN
  Administered 2020-03-25: 1 via RECTAL

## 2020-03-25 MED ORDER — BACITRACIN-NEOMYCIN-POLYMYXIN OINTMENT TUBE
TOPICAL_OINTMENT | CUTANEOUS | Status: AC
  Filled 2020-03-25: qty 14.17

## 2020-03-25 MED ORDER — FENTANYL CITRATE (PF) 100 MCG/2ML IJ SOLN
INTRAMUSCULAR | Status: DC | PRN
  Administered 2020-03-25: 50 ug via INTRAVENOUS
  Administered 2020-03-25: 25 ug via INTRAVENOUS
  Administered 2020-03-25: 100 ug via INTRAVENOUS

## 2020-03-25 MED ORDER — PROPOFOL 10 MG/ML IV BOLUS
INTRAVENOUS | Status: AC
  Filled 2020-03-25: qty 20

## 2020-03-25 MED ORDER — DEXAMETHASONE SODIUM PHOSPHATE 10 MG/ML IJ SOLN
INTRAMUSCULAR | Status: AC
  Filled 2020-03-25: qty 1

## 2020-03-25 MED ORDER — DEXAMETHASONE SODIUM PHOSPHATE 10 MG/ML IJ SOLN
INTRAMUSCULAR | Status: DC | PRN
  Administered 2020-03-25: 10 mg via INTRAVENOUS

## 2020-03-25 MED ORDER — BUPIVACAINE LIPOSOME 1.3 % IJ SUSP
INTRAMUSCULAR | Status: DC | PRN
  Administered 2020-03-25: 20 mL

## 2020-03-25 MED ORDER — OXYCODONE HCL 5 MG PO TABS
5.0000 mg | ORAL_TABLET | Freq: Once | ORAL | Status: AC
  Administered 2020-03-25: 5 mg via ORAL
  Filled 2020-03-25: qty 1

## 2020-03-25 MED ORDER — BUPIVACAINE LIPOSOME 1.3 % IJ SUSP
20.0000 mL | Freq: Once | INTRAMUSCULAR | Status: DC
  Filled 2020-03-25: qty 20

## 2020-03-25 MED ORDER — HYDROMORPHONE HCL 1 MG/ML IJ SOLN
0.2500 mg | INTRAMUSCULAR | Status: DC | PRN
  Administered 2020-03-25 (×2): 0.5 mg via INTRAVENOUS
  Filled 2020-03-25: qty 0.5

## 2020-03-25 MED ORDER — LIDOCAINE 2% (20 MG/ML) 5 ML SYRINGE
INTRAMUSCULAR | Status: DC | PRN
  Administered 2020-03-25: 100 mg via INTRAVENOUS

## 2020-03-25 MED ORDER — SCOPOLAMINE 1 MG/3DAYS TD PT72
MEDICATED_PATCH | TRANSDERMAL | Status: AC
  Filled 2020-03-25: qty 1

## 2020-03-25 MED ORDER — MEPERIDINE HCL 25 MG/ML IJ SOLN
6.2500 mg | INTRAMUSCULAR | Status: DC | PRN
  Filled 2020-03-25: qty 1

## 2020-03-25 MED ORDER — GABAPENTIN 300 MG PO CAPS
ORAL_CAPSULE | ORAL | Status: AC
  Filled 2020-03-25: qty 1

## 2020-03-25 MED ORDER — METHYLENE BLUE 0.5 % INJ SOLN
INTRAVENOUS | Status: DC | PRN
  Administered 2020-03-25: 1 mL via SUBMUCOSAL

## 2020-03-25 MED ORDER — GENTAMICIN SULFATE 40 MG/ML IJ SOLN
5.0000 mg/kg | INTRAVENOUS | Status: DC
  Filled 2020-03-25: qty 7.25

## 2020-03-25 MED ORDER — ONDANSETRON HCL 4 MG/2ML IJ SOLN
INTRAMUSCULAR | Status: AC
  Filled 2020-03-25: qty 2

## 2020-03-25 MED ORDER — CELECOXIB 200 MG PO CAPS
ORAL_CAPSULE | ORAL | Status: AC
  Filled 2020-03-25: qty 1

## 2020-03-25 MED ORDER — MIDAZOLAM HCL 5 MG/5ML IJ SOLN
INTRAMUSCULAR | Status: DC | PRN
  Administered 2020-03-25: 2 mg via INTRAVENOUS

## 2020-03-25 MED ORDER — ACETAMINOPHEN 500 MG PO TABS
1000.0000 mg | ORAL_TABLET | ORAL | Status: AC
  Administered 2020-03-25: 1000 mg via ORAL
  Filled 2020-03-25: qty 2

## 2020-03-25 MED ORDER — CELECOXIB 200 MG PO CAPS
200.0000 mg | ORAL_CAPSULE | ORAL | Status: AC
  Administered 2020-03-25: 200 mg via ORAL
  Filled 2020-03-25: qty 1

## 2020-03-25 MED ORDER — BUPIVACAINE-EPINEPHRINE 0.5% -1:200000 IJ SOLN
INTRAMUSCULAR | Status: DC | PRN
  Administered 2020-03-25: 20 mL

## 2020-03-25 MED ORDER — ROCURONIUM BROMIDE 50 MG/5ML IV SOSY
PREFILLED_SYRINGE | INTRAVENOUS | Status: DC | PRN
  Administered 2020-03-25: 70 mg via INTRAVENOUS
  Administered 2020-03-25: 10 mg via INTRAVENOUS

## 2020-03-25 MED ORDER — GABAPENTIN 300 MG PO CAPS
300.0000 mg | ORAL_CAPSULE | ORAL | Status: AC
  Administered 2020-03-25: 300 mg via ORAL
  Filled 2020-03-25: qty 1

## 2020-03-25 MED ORDER — HYDROMORPHONE HCL 1 MG/ML IJ SOLN
INTRAMUSCULAR | Status: AC
  Filled 2020-03-25: qty 1

## 2020-03-25 MED ORDER — SUGAMMADEX SODIUM 200 MG/2ML IV SOLN
INTRAVENOUS | Status: DC | PRN
  Administered 2020-03-25: 120 mg via INTRAVENOUS

## 2020-03-25 MED ORDER — LACTATED RINGERS IV SOLN
INTRAVENOUS | Status: DC
  Filled 2020-03-25: qty 1000

## 2020-03-25 MED ORDER — ONDANSETRON HCL 4 MG/2ML IJ SOLN
4.0000 mg | Freq: Once | INTRAMUSCULAR | Status: DC | PRN
  Filled 2020-03-25: qty 2

## 2020-03-25 SURGICAL SUPPLY — 66 items
APL SKNCLS STERI-STRIP NONHPOA (GAUZE/BANDAGES/DRESSINGS) ×2
BENZOIN TINCTURE PRP APPL 2/3 (GAUZE/BANDAGES/DRESSINGS) ×3 IMPLANT
BLADE CLIPPER SENSICLIP SURGIC (BLADE) IMPLANT
BLADE HEX COATED 2.75 (ELECTRODE) ×3 IMPLANT
BLADE SURG 10 STRL SS (BLADE) IMPLANT
BLADE SURG 15 STRL LF DISP TIS (BLADE) ×2 IMPLANT
BLADE SURG 15 STRL SS (BLADE) ×3
BRIEF STRETCH FOR OB PAD LRG (UNDERPADS AND DIAPERS) ×3 IMPLANT
CANISTER SUCT 1200ML W/VALVE (MISCELLANEOUS) ×3 IMPLANT
COVER BACK TABLE 60X90IN (DRAPES) ×3 IMPLANT
COVER MAYO STAND STRL (DRAPES) ×3 IMPLANT
COVER WAND RF STERILE (DRAPES) ×3 IMPLANT
DECANTER SPIKE VIAL GLASS SM (MISCELLANEOUS) ×1 IMPLANT
DRAPE HYSTEROSCOPY (DRAPE) ×3 IMPLANT
DRAPE LAPAROTOMY 100X72 PEDS (DRAPES) ×3 IMPLANT
DRAPE SHEET LG 3/4 BI-LAMINATE (DRAPES) IMPLANT
DRSG PAD ABDOMINAL 8X10 ST (GAUZE/BANDAGES/DRESSINGS) ×4 IMPLANT
ELECT NDL TIP 2.8 STRL (NEEDLE) IMPLANT
ELECT NEEDLE TIP 2.8 STRL (NEEDLE) IMPLANT
ELECT REM PT RETURN 9FT ADLT (ELECTROSURGICAL) ×3
ELECTRODE REM PT RTRN 9FT ADLT (ELECTROSURGICAL) ×2 IMPLANT
FILTER STRAW (MISCELLANEOUS) ×1 IMPLANT
GAUZE SPONGE 4X4 12PLY STRL (GAUZE/BANDAGES/DRESSINGS) ×2 IMPLANT
GAUZE SPONGE 4X4 12PLY STRL LF (GAUZE/BANDAGES/DRESSINGS) ×2 IMPLANT
GLOVE ECLIPSE 8.0 STRL XLNG CF (GLOVE) ×3 IMPLANT
GLOVE INDICATOR 8.0 STRL GRN (GLOVE) ×3 IMPLANT
GOWN STRL REUS W/TWL XL LVL3 (GOWN DISPOSABLE) ×3 IMPLANT
IV CATH 14GX2 1/4 (CATHETERS) IMPLANT
IV CATH PLACEMENT 20 GA (IV SOLUTION) ×1 IMPLANT
KIT SIGMOIDOSCOPE (SET/KITS/TRAYS/PACK) IMPLANT
KIT TURNOVER CYSTO (KITS) ×3 IMPLANT
LEGGING LITHOTOMY PAIR STRL (DRAPES) IMPLANT
LOOP VESSEL MAXI BLUE (MISCELLANEOUS) IMPLANT
NEEDLE HYPO 22GX1.5 SAFETY (NEEDLE) ×3 IMPLANT
NS IRRIG 500ML POUR BTL (IV SOLUTION) ×3 IMPLANT
PACK BASIN DAY SURGERY FS (CUSTOM PROCEDURE TRAY) ×3 IMPLANT
PAD PREP 24X48 CUFFED NSTRL (MISCELLANEOUS) ×1 IMPLANT
PENCIL BUTTON HOLSTER BLD 10FT (ELECTRODE) ×3 IMPLANT
SCRUB TECHNI CARE 4 OZ NO DYE (MISCELLANEOUS) ×3 IMPLANT
SHEARS HARMONIC 9CM CVD (BLADE) IMPLANT
SURGILUBE 2OZ TUBE FLIPTOP (MISCELLANEOUS) ×3 IMPLANT
SUT CHROMIC 2 0 SH (SUTURE) ×1 IMPLANT
SUT CHROMIC 3 0 SH 27 (SUTURE) ×5 IMPLANT
SUT ETHIBOND 0 (SUTURE) IMPLANT
SUT MNCRL AB 4-0 PS2 18 (SUTURE) IMPLANT
SUT PROLENE 2 0 SH DA (SUTURE) IMPLANT
SUT VIC AB 2-0 SH 27 (SUTURE)
SUT VIC AB 2-0 SH 27XBRD (SUTURE) IMPLANT
SUT VIC AB 2-0 UR6 27 (SUTURE) ×29 IMPLANT
SUT VIC AB 3-0 SH 18 (SUTURE) IMPLANT
SUT VICRYL 0 UR6 27IN ABS (SUTURE) IMPLANT
SUT VICRYL AB 2 0 TIE (SUTURE) IMPLANT
SUT VICRYL AB 2 0 TIES (SUTURE)
SWAB COLLECTION DEVICE MRSA (MISCELLANEOUS) IMPLANT
SWAB CULTURE ESWAB REG 1ML (MISCELLANEOUS) IMPLANT
SYR 20ML LL LF (SYRINGE) ×3 IMPLANT
SYR 27GX1/2 1ML LL SAFETY (SYRINGE) ×3 IMPLANT
SYR BULB IRRIGATION 50ML (SYRINGE) ×3 IMPLANT
SYR CONTROL 10ML LL (SYRINGE) IMPLANT
TAPE CLOTH 3X10 TAN LF (GAUZE/BANDAGES/DRESSINGS) ×3 IMPLANT
TOWEL OR 17X26 10 PK STRL BLUE (TOWEL DISPOSABLE) ×6 IMPLANT
TRAY DSU PREP LF (CUSTOM PROCEDURE TRAY) ×3 IMPLANT
TUBE CONNECTING 12X1/4 (SUCTIONS) ×3 IMPLANT
UNDERPAD 30X30 (UNDERPADS AND DIAPERS) ×3 IMPLANT
WATER STERILE IRR 500ML POUR (IV SOLUTION) ×3 IMPLANT
YANKAUER SUCT BULB TIP NO VENT (SUCTIONS) ×6 IMPLANT

## 2020-03-25 NOTE — Discharge Instructions (Signed)
Information for Discharge Teaching: EXPAREL (bupivacaine liposome injectable suspension)   Your surgeon gave you EXPAREL(bupivacaine) in your surgical incision to help control your pain after surgery.   EXPAREL is a local anesthetic that provides pain relief by numbing the tissue around the surgical site.  EXPAREL is designed to release pain medication over time and can control pain for up to 72 hours.  Depending on how you respond to EXPAREL, you may require less pain medication during your recovery.  Possible side effects:  Temporary loss of sensation or ability to move in the area where bupivacaine was injected.  Nausea, vomiting, constipation  Rarely, numbness and tingling in your mouth or lips, lightheadedness, or anxiety may occur.  Call your doctor right away if you think you may be experiencing any of these sensations, or if you have other questions regarding possible side effects.  Follow all other discharge instructions given to you by your surgeon or nurse. Eat a healthy diet and drink plenty of water or other fluids.  If you return to the hospital for any reason within 96 hours following the administration of EXPAREL, please inform your health care providers.    Post Anesthesia Home Care Instructions  Activity: Get plenty of rest for the remainder of the day. A responsible adult should stay with you for 24 hours following the procedure.  For the next 24 hours, DO NOT: -Drive a car -Advertising copywriter -Drink alcoholic beverages -Take any medication unless instructed by your physician -Make any legal decisions or sign important papers.  Meals: Start with liquid foods such as gelatin or soup. Progress to regular foods as tolerated. Avoid greasy, spicy, heavy foods. If nausea and/or vomiting occur, drink only clear liquids until the nausea and/or vomiting subsides. Call your physician if vomiting continues.  Special Instructions/Symptoms: Your throat may feel dry or  sore from the anesthesia or the breathing tube placed in your throat during surgery. If this causes discomfort, gargle with warm salt water. The discomfort should disappear within 24 hours.  If you had a scopolamine patch placed behind your ear for the management of post- operative nausea and/or vomiting:  1. The medication in the patch is effective for 72 hours, after which it should be removed.  Wrap patch in a tissue and discard in the trash. Wash hands thoroughly with soap and water. 2. You may remove the patch earlier than 72 hours if you experience unpleasant side effects which may include dry mouth, dizziness or visual disturbances. 3. Avoid touching the patch. Wash your hands with soap and water after contact with the patch.       ANORECTAL SURGERY:  POST OPERATIVE INSTRUCTIONS  ######################################################################  EAT Start with a pureed / full liquid diet After 24 hours, gradually transition to a high fiber diet.    CONTROL PAIN Control pain so you can tolerate bowel movements,  walk, sleep, tolerate sneezing/coughing, and go up/down stairs.   HAVE A BOWEL MOVEMENT DAILY Keep your bowels regular to avoid problems.   Taking a fiber supplement every day to keep bowels soft.   Try a laxative to override constipation. Use an antidairrheal to slow down diarrhea.   Call if not better after 2 tries  WALK Walk an hour a day.  Control your pain to do that.   CALL IF YOU HAVE PROBLEMS/CONCERNS Call if you are still struggling despite following these instructions. Call if you have concerns not answered by these instructions  ######################################################################    1. Take your usually  prescribed home medications unless otherwise directed.  2. DIET: Follow a light bland diet & liquids the first 24 hours after arrival home, such as soup, liquids, starches, etc.  Be sure to drink plenty of fluids.  Quickly  advance to a usual solid diet within a few days.  Avoid fast food or heavy meals as your are more likely to get nauseated or have irregular bowels.  A low-fat, high-fiber diet for the rest of your life is ideal.  3. PAIN CONTROL: a. Pain is best controlled by a usual combination of three different methods TOGETHER: i. Ice/Heat ii. Over the counter pain medication iii. Prescription pain medication b. Expect swelling and discomfort in the anus/rectal area.  Warm water baths (30-60 minutes up to 6 times a day, especially after bowel meovements) will help. Use ice for the first few days to help decrease swelling and bruising, then switch to heat such as warm towels, sitz baths, warm baths, etc to help relax tight/sore spots and speed recovery.  Some people prefer to use ice alone, heat alone, alternating between ice & heat.  Experiment to what works for you.   c. It is helpful to take an over-the-counter pain medication continuously for the first few weeks.  Choose one of the following that works best for you: i. Naproxen (Aleve, etc)  Two 220mg  tabs twice a day ii. Ibuprofen (Advil, etc) Three 200mg  tabs four times a day (every meal & bedtime) iii. Acetaminophen (Tylenol, etc) 500-650mg  four times a day (every meal & bedtime) d. A  prescription for pain medication (such as oxycodone, hydrocodone, etc) should be given to you upon discharge.  Take your pain medication as prescribed.  i. If you are having problems/concerns with the prescription medicine (does not control pain, nausea, vomiting, rash, itching, etc), please call us [phone] to see if we need to switch you to a different pain medicine that will work better for you and/or control your side effect better. ii. If you need a refill on your pain medication, please contact your pharmacy.  They will contact our office to request authorization. Prescriptions will not be filled after 5 pm or on week-ends.  If can take up to 48 hours for it to be  filled & ready so avoid waiting until you are down to thel ast pill. e. A topical cream (Dibucaine) or a prescription for a cream (such as diltiazem 2% gel) may be given to you.  Many people find relief with topical creams.  Some people find it burns too much.  Experiment.  If it helps, use it.  If it burns, don't using it.  Use a Sitz Bath 4-8 times a day for relief   [employer] A sitz bath is a warm water bath taken in the sitting position that covers only the hips and buttocks. It may be used for either healing or hygiene purposes. Sitz baths are also used to relieve pain, itching, or muscle spasms. The water may contain medicine. Moist heat will help you heal and relax.  HOME CARE INSTRUCTIONS  Take 3 to 4 sitz baths a day. 1. Fill the bathtub half full with warm water. 2. Sit in the water and open the drain a little. 3. Turn on the warm water to keep the tub half full. Keep the water running constantly. 4. Soak in the water for 15 to 20 minutes. 5. After the sitz bath, pat the affected area dry first.   4. KEEP YOUR BOWELS REGULAR  a. The goal is one soft bowel movement a day b. Avoid getting constipated.  Between the surgery and the pain medications, it is common to experience some constipation.  Increasing fluid intake and taking a fiber supplement (such as Metamucil, Citrucel, FiberCon, MiraLax, etc) 2-3 times a day regularly will usually help prevent this problem from occurring.  A mild laxative (prune juice, Milk of Magnesia, MiraLax, etc) should be taken according to package directions if there are no bowel movements after 48 hours. c. Watch out for diarrhea.  If you have many loose bowel movements, simplify your diet to bland foods & liquids for a few days.  Stop any stool softeners and decrease your fiber supplement.  Switching to mild anti-diarrheal medications (Kayopectate, Pepto Bismol) can help.  Can try an imodium/loperamide dose.  If this worsens or does not improve, please call  us.  5. Wound Care  a. Remove your bandages with your first bowel movement, usually the day after surgery.  You may have packing if you had an abscess.  Let any packing or gauze fall come out.   b. Wear an absorbent pad or soft cotton balls in your underwear as needed to catch any drainage and help keep the area  c. Keep the area clean and dry.  Bathe / shower every day.  Keep the area clean by showering / bathing over the incision / wound.   It is okay to soak an open wound to help wash it.  Consider using a squeeze bottle filled with warm water to gently wash the anal area.  Wet wipes or showers / gentle washing after bowel movements is often less traumatic than regular toilet paper. d. [name] will often notice bleeding with bowel movements.  This should slow down by the end of the first week of surgery.  Sitting on an ice pack can help. e. Expect some drainage.  This should slow down by the end of the first week of surgery, but you will have occasional bleeding or drainage up to a few months after surgery.  Wear an absorbent pad or soft cotton gauze in your underwear until the drainage stops.  6. ACTIVITIES as tolerated:   a. You may resume regular (light) daily activities beginning the next day--such as daily self-care, walking, climbing stairs--gradually increasing activities as tolerated.  If you can walk 30 minutes without difficulty, it is safe to try more intense activity such as jogging, treadmill, bicycling, low-impact aerobics, swimming, etc. b. Save the most intensive and strenuous activity for last such as sit-ups, heavy lifting, contact sports, etc  Refrain from any heavy lifting or straining until you are off narcotics for pain control.   c. DO NOT PUSH THROUGH PAIN.  Let pain be your guide: If it hurts to do something, don't do it.  Pain is your body warning you to avoid that activity for another week until the pain goes down. d. You may drive when you are no longer taking prescription  pain medication, you can comfortably sit for long periods of time, and you can safely maneuver your car and apply brakes. e. [name] may have sexual intercourse when it is comfortable.  7. FOLLOW UP in our office a. Please call [facility] at [phone] to set up an appointment to see your surgeon in the office for a follow-up appointment approximately 2-3 weeks after your surgery. b. Make sure that you call for this appointment the day you arrive home to ensure a convenient appointment time.  8. IF YOU HAVE DISABILITY OR FAMILY LEAVE FORMS, BRING THEM TO THE OFFICE FOR PROCESSING.  DO NOT GIVE THEM TO YOUR DOCTOR.        WHEN TO CALL us [phone]: 1. Poor pain control 2. Reactions / problems with new medications (rash/itching, nausea, etc)  3. Fever over 101.5 F (38.5 C) 4. Inability to urinate 5. Nausea and/or vomiting 6. Worsening swelling or bruising 7. Continued bleeding from incision. 8. Increased pain, redness, or drainage from the incision  The clinic staff is available to answer your questions during regular business hours (8:30am-5pm).  Please don't hesitate to call and ask to speak to one of our nurses for clinical concerns.   A surgeon from [facility] is always on call at the hospitals   If you have a medical emergency, go to the nearest emergency room or call 911.    [facility] [address], [place], [place]  [address] ? MAIN: [phone] ? TOLL FREE: [phone] ? FAX [phone] [email or web address]

## 2020-03-25 NOTE — Interval H&P Note (Signed)
History and Physical Interval Note:  03/25/2020 10:56 AM  [name] [name]  has presented today for surgery, with the diagnosis of PERIRECTAL FISTULA, GRADE 4 PROLAPSED HEMORRHOID.  The various methods of treatment have been discussed with the patient and family. After consideration of risks, benefits and other options for treatment, the patient has consented to  Procedure(s): ANORECTAL EXAM UNDER ANESTHESIA, REPAIR OF PERIRECTAL FISTULA (N/A) POSSIBLE HEMORRHOIDECTOMY (N/A) POSSIBLE SETON (N/A) as a surgical intervention.  The patient's history has been reviewed, patient examined, no change in status, stable for surgery.  I have reviewed the patient's chart and labs.  Questions were answered to the patient's satisfaction.    I have re-reviewed the the patient's records, history, medications, and allergies.  I have re-examined the patient.  I again discussed intraoperative plans and goals of post-operative recovery.  The patient agrees to proceed.  [name] [name]  January 18, 1986 [id number]  Patient Care Team: Fleet Contras, MD as PCP - General (Internal Medicine) Adam Phenix, MD as Consulting Physician (Obstetrics and Gynecology)  Patient Active Problem List   Diagnosis Date Noted   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   Hemorrhoids    ASCUS with positive high risk HPV cervical 11/25/2018   Rubella non-immune status, antepartum 11/25/2018   Trichomonas infection 11/25/2018   Iron deficiency anemia due to chronic blood loss 07/04/2018   Perianal abscess 07/04/2018   Inadequate social support 07/04/2018   History of Cocaine abuse affecting pregnancy in third trimester (HCC) 04/2016 04/27/2016   Tobacco abuse 06/17/2012    Past Medical History:  Diagnosis Date   Abscess of axilla    Anal fissure 2014   Anemia    Anxiety    Bipolar 1 disorder (HCC)    Chronic bronchitis (HCC)    COVID-19 02/28/2020   LOSS OF TASTE AND SMELL, NOW RESOLVED AFTER QUARANTINE   Depression    bipolar; not on  meds   Headache(784.0)    migraine   Heart murmur    as a child none now   Hemorrhoids    Hypokalemia 06/17/2012   Perianal abscess 12/2017   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   Substance abuse (HCC)    Suicide attempt (HCC) 06/19/2012   UTI (lower urinary tract infection) 06/19/2012    Past Surgical History:  Procedure Laterality Date   DENTAL SURGERY     INCISE AND DRAIN ABCESS  01/16/2018   INCISION AND DRAINAGE PERIRECTAL ABSCESS N/A 01/16/2018   Procedure: IRRIGATION AND DEBRIDEMENT PERIRECTAL ABSCESS;  Surgeon: Rodman Pickle, MD;  Location: [facility] OR;  Service: General;  Laterality: N/A;   LAPAROSCOPIC TUBAL LIGATION Bilateral 02/05/2019   Procedure: LAPAROSCOPIC TUBAL LIGATION WITH CERVICAL POLYPECTOMY;  Surgeon: Allie Bossier, MD;  Location:  [facility];  Service: Gynecology;  Laterality: Bilateral;    Social History   Socioeconomic History   Marital status: Single    Spouse name: Not on file   Number of children: Not on file   Years of education: Not on file   Highest education level: Not on file  Occupational History   Not on file  Tobacco Use   Smoking status: Current Every Day Smoker    Packs/day: 0.00    Years: 9.00    Pack years: 0.00    Types: Cigarettes    Last attempt to quit: 01/14/2018    Years since quitting: 2.1   Smokeless tobacco: Never Used   Tobacco comment: 3 cigarettes per day  Substance and Sexual Activity  Alcohol use: Not Currently    Comment: rare   Drug use: Not Currently    Frequency: 3.0 times per week    Types: Marijuana, Cocaine    Comment: last time was 2019 for cocaine, none since occ marijuana   Sexual activity: Yes    Birth control/protection: None  Other Topics Concern   Not on file  Social History Narrative   Not on file   Social Determinants of Health   Financial Resource Strain:    Difficulty of Paying Living Expenses:   Food Insecurity:    Worried About Charity fundraiser in the Last Year:     Arboriculturist in the Last Year:   Transportation Needs:    Film/video editor (Medical):    Lack of Transportation (Non-Medical):   Physical Activity:    Days of Exercise per Week:    Minutes of Exercise per Session:   Stress:    Feeling of Stress :   Social Connections:    Frequency of Communication with Friends and Family:    Frequency of Social Gatherings with Friends and Family:    Attends Religious Services:    Active Member of Clubs or Organizations:    Attends Music therapist:    Marital Status:   Intimate Partner Violence:    Fear of Current or Ex-Partner:    Emotionally Abused:    Physically Abused:    Sexually Abused:     Family History  Problem Relation Age of Onset   Hypertension Mother    Diabetes type II Mother    Arthritis Mother    Hyperlipidemia Mother    Cancer Father        lung    Medications Prior to Admission  Medication Sig Dispense Refill Last Dose   albuterol (VENTOLIN HFA) 108 (90 Base) MCG/ACT inhaler Inhale 1-2 puffs into the lungs every 6 (six) hours as needed for wheezing or shortness of breath. (Patient taking differently: Inhale 1-2 puffs into the lungs every 6 (six) hours as needed for wheezing or shortness of breath. Out of inhaler) 6.7 g 1 More than a month at Unknown time    Current Facility-Administered Medications  Medication Dose Route Frequency Provider Last Rate Last Admin   bupivacaine liposome (EXPAREL) 1.3 % injection 266 mg  20 mL Infiltration Once Michael Boston, MD       Chlorhexidine Gluconate Cloth 2 % PADS 6 each  6 each Topical Once Michael Boston, MD       And   Chlorhexidine Gluconate Cloth 2 % PADS 6 each  6 each Topical Once Briyanna Billingham, Remo Lipps, MD       clindamycin (CLEOCIN) IVPB 900 mg  900 mg Intravenous On Call to OR Michael Boston, MD       And   gentamicin (GARAMYCIN) 290 mg in dextrose 5 % 100 mL IVPB  5 mg/kg Intravenous On Call to OR Michael Boston, MD       gentamicin (GARAMYCIN) 290 mg in  dextrose 5 % 100 mL IVPB  5 mg/kg Intravenous 30 min Pre-Op Michael Boston, MD       lactated ringers infusion   Intravenous Continuous Myrtie Soman, MD 50 mL/hr at 03/25/20 1048 New Bag at 03/25/20 1048     Allergies  Allergen Reactions   Penicillins Nausea And Vomiting    Has patient had a PCN reaction causing immediate rash, facial/tongue/throat swelling, SOB or lightheadedness with hypotension: No Has patient had a PCN reaction causing  severe rash involving mucus membranes or skin necrosis: No Has patient had a PCN reaction that required hospitalization Yes Has patient had a PCN reaction occurring within the last 10 years: No If all of the above answers are "NO", then may proceed with Cephalosporin use.     BP 100/63   Pulse (!) 57   Temp (!) 97.4 F (36.3 C) (Oral)   Resp 18   Ht 5\' 7"  (1.702 m)   Wt 57.2 kg   LMP 03/08/2020   SpO2 100%   BMI 19.73 kg/m   Labs: Results for orders placed or performed during the hospital encounter of 03/25/20 (from the past 48 hour(s))  Pregnancy, urine POC     Status: None   Collection Time: 03/25/20 10:14 AM  Result Value Ref Range   Preg Test, Ur NEGATIVE NEGATIVE    Comment:        THE SENSITIVITY OF THIS METHODOLOGY IS >24 mIU/mL   I-STAT, chem 8     Status: Abnormal   Collection Time: 03/25/20 10:40 AM  Result Value Ref Range   Sodium 140 135 - 145 mmol/L   Potassium 3.8 3.5 - 5.1 mmol/L   Chloride 105 98 - 111 mmol/L   BUN 7 6 - 20 mg/dL   Creatinine, Ser 05/25/20 0.44 - 1.00 mg/dL   Glucose, Bld 88 70 - 99 mg/dL    Comment: Glucose reference range applies only to samples taken after fasting for at least 8 hours.   Calcium, Ion 1.28 1.15 - 1.40 mmol/L   TCO2 22 22 - 32 mmol/L   Hemoglobin 10.5 (L) 12.0 - 15.0 g/dL   HCT 2.54 (L) 98.2 - 64.1 %    Imaging / Studies: No results found.   58.3, M.D., F.A.C.S. Gastrointestinal and Minimally Invasive Surgery [facility] [address] [place], [place] [phone] Main / Paging  03/25/2020 10:57 AM    05/25/2020

## 2020-03-25 NOTE — Op Note (Signed)
03/25/2020  12:51 PM  PATIENT:  [name] [name]  34 y.o. female  Patient Care Team: Nolene Ebbs, MD as PCP - General (Internal Medicine) Woodroe Mode, MD as Consulting Physician (Obstetrics and Gynecology) Michael Boston, MD as Consulting Physician (General Surgery)  PRE-OPERATIVE DIAGNOSIS:  PERIRECTAL WOUND, PROBABLE FISTULA GRADE 4 PROLAPSED HEMORRHOID  POST-OPERATIVE DIAGNOSIS:   PERIRECTAL FISTULA - INTERSPHINCTERIC GRADE 4 PROLAPSED INTERNAL/EXTERNAL HEMORRHOIDS BARTHOLIN CYSTIC ABSCESS  PROCEDURE:  ANORECTAL EXAM UNDER ANESTHESIA REPAIR OF PERIRECTAL FISTULA (LIFT = LIGATION OF INTERSPHINCTERIC TRACT) HEMORRHOIDAL LIGATION/PEXY HEMORRHOIDECTOMY X 3 EXCISION OF BARTHOLIN CYST/ABSCESS  SURGEON:  Adin Hector, MD  ASSISTANT: Fran Lowes, PA-S, [employer]   ANESTHESIA:   General Anorectal & Local field block (0.25% bupivacaine with epinephrine mixed with Liposomal bupivacaine (Experel)    EBL:  Total I/O In: 157.3 [IV Piggyback:157.3] Out: 10 [Blood:10].  See anesthesia record  Delay start of Pharmacological VTE agent (>24hrs) due to surgical blood loss or risk of bleeding:  no  DRAINS: none   SPECIMEN:   Left anterior external perirectal fistulous tract Cystic mass of left posterior introitus, probable Bartholin cyst Internal/external hemorrhoids  DISPOSITION OF SPECIMEN:  PATHOLOGY  COUNTS:  YES  PLAN OF CARE: Discharge to home after PACU  PATIENT DISPOSITION:  PACU - hemodynamically stable.  INDICATION: Patient with probable perirectal fistula.  I recommended examination and surgical treatment:  The anatomy & physiology of the anorectal region was discussed.  We discussed the pathophysiology of anorectal abscess and fistula.  Differential diagnosis was discussed.  Natural history progression was discussed.   I stressed the importance of a bowel regimen to have daily soft bowel movements to minimize progression of disease.     The  patient's condition is not adequately controlled.  Non-operative treatment has not healed the fistula.  Therefore, I recommended examination under anaesthesia to confirm the diagnosis and treat the fistula.  I discussed techniques that may be required such as fistulotomy, ligation by LIFT technique, and/or seton placement.  Benefits & alternatives discussed.  I noted a good likelihood this will help address the problem, but sometimes repeat operations and prolonged healing times may occur.  Risks such as bleeding, pain, recurrence, reoperation, incontinence, heart attack, death, and other risks were discussed.      Educational handouts further explaining the pathology, treatment options, and bowel regimen were given.  The patient expressed understanding & wishes to proceed.  We will work to coordinate surgery for a mutually convenient time.   OR FINDINGS: Patient had an intersphincteric fistula.    External location LEFT ANTERIOR  about 3 cm from anal verge.  Internal location : Anterior midline at anal crypt about 1 cm from anal verge.  Anterior midline healed prior fissure.  Giant left anterior external hemorrhoid.  Grade 3 right lateral internal hemorrhoid.  Right posterior external hemorrhoid.  1 cm inflamed cystic mass at the introitus left posterior suspicious for inflamed Bartholin cyst.  Not connected to the perirectal fistula.  DESCRIPTION:   Informed consent was confirmed. Patient underwent general anesthesia without difficulty. Patient was placed into prone positioning.  The perianal region was prepped and draped in sterile fashion. Surgical timeout confirmed or plan.  I did digital rectal examination and then transitioned over to anoscopy to get a sense of the anatomy.  Findings as noted above.  I did place a probe through the external opening.  The tract head turned the anterior midline but I cannot get a definite consistent opening.  I began  to excise the external opening with a  radial biconcave incision around it.  I transitioned to cautery and help free the fistulous tract circumferentially with that I could straighten out the tract.  I cut into the fistulous tract near the external opening and found a clear opening.  I injected the track with methylene blue.  With this I was able to locate an internal opening at an anterior midline anal crypt.  The anterior anal canal was thinned out suspicious for prior healed anal fissure.  The tract did not feel superficial, concerning for a probable intersphincteric fistula.  No abscess located.  There was a small cystic abscess at the introitus left posterior where Bartholin would be expected.  I did not connect with the peritoneal fistula.  No methylene blue staining.  Patient had a very enlarged internal/external hemorrhoid in the left anterior region.  I excised that radially to help clear up the anatomy  I went ahead and proceeded with the LIFT technique.  I did careful dissection to get down to the sphincter complex through the external hemorrhoidectomy wound.  I carefully went between the internal and external sphincter using careful blunt dissection parallel to the fibers.  I was able to locate the intersphincteric component of the fistulous tract.  I was able to get around it gently with a right angle clamp.  I carefully skeletonized the intersphincteric component. I placed 2-0 Vicryl stitches through the intersphincteric tract on the proximal side and on the distal side in between the external & internal sphincters.  I transected the intersphincteric segment of the fistulous tract.   I ligated the stumps of the transected segments with 2-0 Vicryl again with a figure-of-eight stitch in a 90 degree fashion to doubly ligate and prove that the tract had been closed.   I removed the superficial external end of the fistulous tract & ligated the base of the external wound just outside the sphincter component with 2-0 vicryl.  I then  transitioned to the rectal component.  Did a figure-of-eight stitch of 2-0 Vicryl suture six centimeters proximal to the internal opening in the rectum along a hemorrhoidal pile.  I ran that longitudinally until it came to the opening.  I ended up having to excise a very large left anterior hemorrhoid that was grade 3 prolapsing out.  I then ran that stitch down close to the sphincter near the level of the anal verge. Hemostasis was excellent.  I proceeded to do hemorrhoidal ligation and pexy on the rest of the piles.  I used a 2-0 Vicryl suture on a UR-6 needle in a figure-of-eight fashion 6 cm proximal to the anal verge, starting at the enlarged right lateral internal pile that was grade 3.  *Because of redundant hemorrhoidal tissue too bulky to merely ligate or pexy, I excised the excess internal hemorrhoid piles longitudinally in a fusiform biconcave fashion, at the right lateral, right posterior, and had already done left anterior locations, sparing the anal canal to avoid narrowing.  I then ran that stitch longitudinally more distally to close the hemorrhoidectomy wound to the anal verge over a Parks self retaining retractor & occasionally a large Hill-Furgeson retractor to avoid narrowing of the anal canal.  I then tied that stitch down to cause a hemorrhoidopexy.   I then did hemorrhoidal ligation and pexy at all six columns.  At the completion of this, all 6 anorectal columns were ligated and pexied in the classic hexagonal fashion (right anterior/lateral/posterior, left anterior/lateral/posterior).  I repeated vaginal inspection confirmed no fistulous opening internally.  I confirmed then confirmed that the cystic abscess in the left posterior introitus vaginal opening was separate from the perirectal fistula.  I excised that circumferentially removed it.  It seemed consistent with an inflamed/chronically infected Bartholin cyst.  I left that  wound open.  I closed the anal verge over the internal  sphincter ligation.  I closed it transversely with interrupted chromic horizontal mattress sutures.  This help protect the sphincters.  This just left the left anterior perirectal opening from the external part of the fistula in the left posterior introitus opening from the excised Bartholin cyst. I reexamined the anal canal.   There is was no narrowing.  Hemostasis was excellent.  I repeated anoscopy and examination.  Hemostasis was good.  We placed fluff gauze to onlay over the wounds.  No packing done.  Patient is being extubated go to recovery room.  I discussed postop care with the patient in the holding area.  Instructions are written as well.  I discussed operative findings, updated the patient's status, discussed probable steps to recovery, and gave postoperative recommendations to the Patient's mother, [name] [name].  Recommendations were made.  Questions were answered.  She expressed understanding & appreciation.   Ardeth Sportsman, M.D., F.A.C.S. Gastrointestinal and Minimally Invasive Surgery [facility] [address], [address] [place], [place] [address] [phone] Main / Paging

## 2020-03-25 NOTE — Progress Notes (Signed)
Dr. Michaell Cowing aware of bladder scan 150 ml and inability to void after 2 attempts. Bladder firm to palpation. Placed 16 Fr foley cath without difficulty. Tolerated well. Catheter care instructions provided verbally and written. To call [facility] Monday for cath removal.

## 2020-03-25 NOTE — Anesthesia Preprocedure Evaluation (Signed)
Anesthesia Evaluation  Patient identified by MRN, date of birth, ID band Patient awake    Reviewed: Allergy & Precautions, NPO status , Patient's Chart, lab work & pertinent test results  Airway Mallampati: I  TM Distance: >3 FB Neck ROM: Full    Dental   Pulmonary Current Smoker,    Pulmonary exam normal        Cardiovascular Normal cardiovascular exam     Neuro/Psych Anxiety Depression Bipolar Disorder    GI/Hepatic   Endo/Other    Renal/GU      Musculoskeletal   Abdominal   Peds  Hematology   Anesthesia Other Findings   Reproductive/Obstetrics                             Anesthesia Physical Anesthesia Plan  ASA: III  Anesthesia Plan: General   Post-op Pain Management:    Induction: Intravenous  PONV Risk Score and Plan: 2  Airway Management Planned: Oral ETT  Additional Equipment:   Intra-op Plan:   Post-operative Plan: Extubation in OR  Informed Consent: I have reviewed the patients History and Physical, chart, labs and discussed the procedure including the risks, benefits and alternatives for the proposed anesthesia with the patient or authorized representative who has indicated his/her understanding and acceptance.       Plan Discussed with: CRNA and Surgeon  Anesthesia Plan Comments:         Anesthesia Quick Evaluation

## 2020-03-25 NOTE — Anesthesia Postprocedure Evaluation (Signed)
Anesthesia Post Note  Patient: Khady C Feazell  Procedure(s) Performed: ANORECTAL EXAM UNDER ANESTHESIA, REPAIR OF PERIRECTAL FISTULA (N/A Rectum) HEMORRHOIDECTOMY (N/A Rectum)     Patient location during evaluation: PACU Anesthesia Type: General Level of consciousness: awake and alert Pain management: pain level controlled Vital Signs Assessment: post-procedure vital signs reviewed and stable Respiratory status: spontaneous breathing, nonlabored ventilation, respiratory function stable and patient connected to nasal cannula oxygen Cardiovascular status: blood pressure returned to baseline and stable Postop Assessment: no apparent nausea or vomiting Anesthetic complications: no    Last Vitals:  Vitals:   03/25/20 1315 03/25/20 1330  BP: 114/70 111/76  Pulse: 74 65  Resp: 14 14  Temp:    SpO2: 99% 99%    Last Pain:  Vitals:   03/25/20 1330  TempSrc:   PainSc: (P) 8                  Lameshia Hypolite DAVID

## 2020-03-25 NOTE — Anesthesia Procedure Notes (Signed)
Procedure Name: Intubation Date/Time: 03/25/2020 11:14 AM Performed by: Bonney Aid, CRNA Pre-anesthesia Checklist: Patient identified, Emergency Drugs available, Suction available and Patient being monitored Patient Re-evaluated:Patient Re-evaluated prior to induction Oxygen Delivery Method: Circle system utilized Preoxygenation: Pre-oxygenation with 100% oxygen Induction Type: IV induction Ventilation: Mask ventilation without difficulty Laryngoscope Size: Mac and 3 Grade View: Grade I Tube type: Oral Tube size: 7.0 mm Number of attempts: 1 Airway Equipment and Method: Stylet Placement Confirmation: ETT inserted through vocal cords under direct vision,  positive ETCO2 and breath sounds checked- equal and bilateral Secured at: 21 cm Tube secured with: Tape Dental Injury: Teeth and Oropharynx as per pre-operative assessment

## 2020-03-25 NOTE — H&P (Signed)
03/25/2020  [name] [name]   DOB: 02/10/1986  Single / Language: [place] / Race: Black or African American  Female   Patient Care Team: Fleet Contras, MD as PCP - General (Internal Medicine) Adam Phenix, MD as Consulting Physician (Obstetrics and Gynecology)  `  Patient sent for surgical consultation at the request of Hedda Slade  Chief Complaint: Abscess between vagina and rectum. Concern for perirectal or perineal abscess/fistula  `  `  The patient is a dense-looking female. She had a left anterior perirectal abscess that required incision and drainage with Penrose drain by Dr. Sheliah Hatch 2019. She improved went home. Drain was removed. 6 months later she had some discomfort and drainage. Saw Dr. Andrey Campanile when she was in the hospital. He suspected an anal fistula. Patient was to follow up with our group with colorectal surgery. That did not happen. She became pregnant. Patient has noted since that time she's had intermittent swelling and drainage. She will get leaking. Again more intense. [facility]. Concern for possible abscess of the vagina. She saw gynecology. They suspected more of a pararectal abscess or fistula. Surgical consultation requested. She had been on doxycycline the past. She is not on any ABx now.   She comes in today noting that she has drainage. She will change pad a few times a day. She is a has a bowel movement every other day. Has discomfort with bowel movements. She is not on any bowel regimen. She's never had a colonoscopy. No major bleeding. No personal nor family history of GI/colon cancer, inflammatory bowel disease, irritable bowel syndrome, allergy such as Celiac Sprue, dietary/dairy problems, colitis, ulcers nor gastritis. No recent sick contacts/gastroenteritis. No travel outside the country. No changes in diet. No dysphagia to solids or liquids. No significant heartburn or reflux. No melena, hematemesis, coffee ground emesis. No evidence of prior  gastric/peptic ulceration.  (Review of systems as stated in this history (HPI) or in the review of systems. Otherwise all other 12 point ROS are negative)  `  `  `  This patient encounter took 30 minutes today to perform the following: obtain history, perform exam, review outside records, interpret tests & imaging, counsel the patient on their diagnosis; and, document this encounter, including findings & plan in the electronic health record (EHR).     Allergies Michel Bickers, LPN; 05/19/3761 83:15 AM)  Amoxicillin *PENICILLINS*  Medication History Michel Bickers, LPN; 12/24/6158 73:71 AM)  Doxycycline Hyclate (100MG  Tablet, Oral) Active.  No Current Medications (Taken starting 02/17/2020)  Naproxen (375MG  Tablet, Oral) Active.  Medications Reconciled  Vitals 04/18/2020 [employer] LPN; Tresa Endo [facility] AM)  02/17/2020 10:38 AM  Weight: 124.8 lb Height: 67 in  Body Surface Area: 1.65 m Body Mass Index: 19.55 kg/m  Temp.: 97.6 F (Thermal Scan) Pulse: 82 (Regular)  BP: 126/78 (Sitting, Left Arm, Standard)  Physical Exam 85:46 MD; 02/17/2020 10:54 AM)  General  Mental Status - Alert.  General Appearance - Not in acute distress, Not Sickly.  Orientation - Oriented X3.  Hydration - Well hydrated.  Voice - Normal.  Integumentary  Global Assessment  Upon inspection and palpation of skin surfaces of the - Axillae: non-tender, no inflammation or ulceration, no drainage. and Distribution of scalp and body hair is normal.  General Characteristics  Temperature - normal warmth is noted.  Head and Neck  Head - normocephalic, atraumatic with no lesions or palpable masses.  Face  Global Assessment - atraumatic, no absence of expression.  Neck  Global Assessment - no abnormal movements, no bruit auscultated on the right, no bruit auscultated on the left, no decreased range of motion, non-tender.  Trachea - midline.  Thyroid  Gland Characteristics - non-tender.  Eye  Eyeball - Left -  Extraocular movements intact, No Nystagmus - Left.  Eyeball - Right - Extraocular movements intact, No Nystagmus - Right.  Cornea - Left - No Hazy - Left.  Cornea - Right - No Hazy - Right.  Sclera/Conjunctiva - Left - No scleral icterus, No Discharge - Left.  Sclera/Conjunctiva - Right - No scleral icterus, No Discharge - Right.  Pupil - Left - Direct reaction to light normal.  Pupil - Right - Direct reaction to light normal.  ENMT  Ears  Pinna - Left - no drainage observed, no generalized tenderness observed. Pinna - Right - no drainage observed, no generalized tenderness observed.  Nose and Sinuses  External Inspection of the Nose - no destructive lesion observed. Inspection of the nares - Left - quiet respiration. Inspection of the nares - Right - quiet respiration.  Mouth and Throat  Lips - Upper Lip - no fissures observed, no pallor noted. Lower Lip - no fissures observed, no pallor noted. Nasopharynx - no discharge present. Oral Cavity/Oropharynx - Tongue - no dryness observed. Oral Mucosa - no cyanosis observed. Hypopharynx - no evidence of airway distress observed.  Chest and Lung Exam  Inspection  Movements - Normal and Symmetrical. Accessory muscles - No use of accessory muscles in breathing.  Palpation  Palpation of the chest reveals - Non-tender.  Auscultation  Breath sounds - Normal and Clear.  Cardiovascular  Auscultation  Rhythm - Regular. Murmurs & Other Heart Sounds - Auscultation of the heart reveals - No Murmurs and No Systolic Clicks.  Abdomen  Inspection  Inspection of the abdomen reveals - No Visible peristalsis and No Abnormal pulsations. Umbilicus - No Bleeding, No Urine drainage.  Palpation/Percussion  Palpation and Percussion of the abdomen reveal - Soft, Non Tender, No Rebound tenderness, No Rigidity (guarding) and No Cutaneous hyperesthesia.  Note: Abdomen soft. Not severely distended. No distasis recti. No umbilical or other anterior abdominal wall hernias     Female Genitourinary  Sexual Maturity  Tanner 5 - Adult hair pattern.  Note: At the base of the introitus in the anterior perineum is a 15 x 10 mm swollen region. No active drainage but very sensitive. Short midline raphae. Inner and outer labia without any obvious inflammation. No abnormalities of the rectovaginal septum without any obvious rectocele or cystocele. No vaginal bleeding nor discharge  Rectal  Note: Large right anterior external hemorrhoid. Not thrombosed.  Left anterior 3 mm nodule suspicious for sinus of anal fistula.  No obvious fissure. No obvious undrained abscess. Tolerates digital exam but sensitive. No murmur sphincter tone. Tenderness in anterior anal canal but no discrete abscess felt. I cannot get any obvious purulence to express. Held off on anoscopy exam    Peripheral Vascular  Upper Extremity  Inspection - Left - No Cyanotic nailbeds - Left, Not Ischemic. Inspection - Right - No Cyanotic nailbeds - Right, Not Ischemic.  Neurologic  Neurologic evaluation reveals - normal attention span and ability to concentrate, able to name objects and repeat phrases. Appropriate fund of knowledge , normal sensation and normal coordination.  Mental Status  Affect - not angry, not paranoid.  Cranial Nerves - Normal Bilaterally.  Gait - Normal.  Neuropsychiatric  Mental status exam performed with findings of - able to articulate  well with normal speech/language, rate, volume and coherence, thought content normal with ability to perform basic computations and apply abstract reasoning and no evidence of hallucinations, delusions, obsessions or homicidal/suicidal ideation.  Musculoskeletal  Global Assessment  Spine, Ribs and Pelvis - no instability, subluxation or laxity. Right Upper Extremity - no instability, subluxation or laxity.  Lymphatic  Head & Neck  General Head & Neck Lymphatics: Bilateral - Description - No Localized lymphadenopathy.  Axillary  General Axillary  Region: Bilateral - Description - No Localized lymphadenopathy.  Femoral & Inguinal  Generalized Femoral & Inguinal Lymphatics: Left - Description - No Localized lymphadenopathy. Right - Description - No Localized lymphadenopathy.  Assessment & Plan Adin Hector MD; 02/17/2020 12:31 PM)  PERINEAL ABSCESS [phone])  Impression: Abscess of perineum in the anterior perirectal region abutting the introitus. I do not think she is a true rectovaginal fistula. She has a chronic left anterior fistula and now with some pustular formation to the midline now from her prior scarring.  Placed on Cipro and Flagyl antibiotics. Plan examination under anesthesia as noted below  Current Plans  Started Cipro 500 MG Oral Tablet, 1 (one) Tablet two times daily, #14, 7 days starting 02/17/2020, Ref. x1.  Started metroNIDAZOLE 500 MG Oral Tablet, 1 (one) Tablet three times daily, #21, 02/17/2020, Ref. x1.   ANAL FISTULA (K60.3)  Impression: Persistent perirectal drainage with history of prior perirectal abscess left anterior. Swelling going up to her left posterior introitus as well as left pararectal region suspicious for fistula in ano. History of intermittent pain and swelling since summer 2019 suspicious for that as well.  I think she would benefit from examination under anesthesia and unroofing of the abscesses.  I would start her on Cipro and Flagyl to hopefully cool down her recurrent chronic irritation. See if we can minimize infection. Hopefully can try and do a fistulotomy or lift repair if she truly has an intersphincteric fistula. If to inflamed or complicated, may place seton and then plan second operation to get this under control. I explained the pathophysiology and natural history. It seemed to make more sense to her.  I strongly recommend she quit smoking to minimize wound complications and recurrence of fistulas/abscesses    The anatomy & physiology of the anorectal region was discussed. We discussed  the pathophysiology of anorectal abscess and fistula. Differential diagnosis was discussed. Natural history progression was discussed. I stressed the importance of a bowel regimen to have daily soft bowel movements to minimize progression of disease.  The patient's condition is not adequately controlled. Non-operative treatment has not healed the fistula. Therefore, I recommended examination under anaesthesia to confirm the diagnosis and treat the fistula. I discussed techniques that may be required such as fistulotomy, ligation by LIFT technique, and/or seton placement. Benefits & alternatives discussed. I noted a good likelihood this will help address the problem, but sometimes repeat operations and prolonged healing times may occur. Risks such as bleeding, pain, recurrence, reoperation, incontinence, heart attack, death, and other risks were discussed.  Educational handouts further explaining the pathology, treatment options, and bowel regimen were given. The patient expressed understanding & wishes to proceed. We will work to coordinate surgery for a mutually convenient time.   Pt Education - CCS Abscess/Fistula (AT): discussed with patient and provided information.  PROLAPSED INTERNAL HEMORRHOIDS, GRADE 4 (K64.3)  Impression: Large right anterior chronically prolapsed hemorrhoid with tag. We'll try and do hemorrhoidal pexy and possible excision at the time of fistula repair as long as  I do not cause any new significant scarring. She would like to try and get rid of the hemorrhoid as well possible.    The anatomy & physiology of the anorectal region was discussed. The pathophysiology of hemorrhoids and differential diagnosis was discussed. Natural history risks without surgery was discussed. I stressed the importance of a bowel regimen to have daily soft bowel movements to minimize progression of disease. Interventions such as sclerotherapy & banding were discussed.  The patient's symptoms are not  adequately controlled by medicines and other non-operative treatments. I feel the risks & problems of no surgery outweigh the operative risks; therefore, I recommended surgery to treat the hemorrhoids by ligation, pexy, and possible resection.  Risks such as bleeding, infection, urinary difficulties, need for further treatment, heart attack, death, and other risks were discussed. I noted a good likelihood this will help address the problem. Goals of post-operative recovery were discussed as well. Possibility that this will not correct all symptoms was explained. Post-operative pain, bleeding, constipation, and other problems after surgery were discussed. We will work to minimize complications. Educational handouts further explaining the pathology, treatment options, and bowel regimen were given as well. Questions were answered. The patient expresses understanding & wishes to proceed with surgery.    Electronically signed by Karie Soda, MD at 02/17/2020 12:32 PM   Addendum: Patient found to be Covid positive last month.  It has been more than 3 weeks.  She is ready to consider surgery.  Ardeth Sportsman, MD, FACS, MASCRS Gastrointestinal and Minimally Invasive Surgery  [facility] Surgery [address] [place], [place] [address] [phone] Main / Paging [phone] Fax

## 2020-03-25 NOTE — Transfer of Care (Signed)
Immediate Anesthesia Transfer of Care Note  Patient: [name] [name]  Procedure(s) Performed: ANORECTAL EXAM UNDER ANESTHESIA, REPAIR OF PERIRECTAL FISTULA (N/A Rectum) HEMORRHOIDECTOMY (N/A Rectum)  Patient Location: PACU  Anesthesia Type:General  Level of Consciousness: drowsy  Airway & Oxygen Therapy: Patient Spontanous Breathing and Patient connected to nasal cannula oxygen  Post-op Assessment: Report given to RN  Post vital signs: Reviewed and stable  Last Vitals: 109/76, 85, 20, 100%, 97.4 Vitals Value Taken Time  BP    Temp    Pulse    Resp    SpO2      Last Pain:  Vitals:   03/25/20 1040  TempSrc: Oral  PainSc: 3       Patients Stated Pain Goal: 3 (03/25/20 1040)  Complications: No apparent anesthesia complications

## 2020-03-26 ENCOUNTER — Encounter (HOSPITAL_COMMUNITY): Payer: Self-pay | Admitting: Emergency Medicine

## 2020-03-26 ENCOUNTER — Emergency Department (HOSPITAL_COMMUNITY)
Admission: EM | Admit: 2020-03-26 | Discharge: 2020-03-26 | Disposition: A | Discharge status: Home / Self Care | Payer: Medicaid Other | Attending: Emergency Medicine | Admitting: Emergency Medicine

## 2020-03-26 ENCOUNTER — Other Ambulatory Visit: Payer: Self-pay

## 2020-03-26 DIAGNOSIS — G8918 Other acute postprocedural pain: Secondary | ICD-10-CM | POA: Insufficient documentation

## 2020-03-26 DIAGNOSIS — F17210 Nicotine dependence, cigarettes, uncomplicated: Secondary | ICD-10-CM | POA: Diagnosis not present

## 2020-03-26 DIAGNOSIS — R198 Other specified symptoms and signs involving the digestive system and abdomen: Secondary | ICD-10-CM | POA: Diagnosis not present

## 2020-03-26 DIAGNOSIS — K6289 Other specified diseases of anus and rectum: Secondary | ICD-10-CM | POA: Diagnosis present

## 2020-03-26 LAB — SURGICAL PATHOLOGY

## 2020-03-26 MED ORDER — DIAZEPAM 5 MG/ML IJ SOLN
2.5000 mg | Freq: Once | INTRAMUSCULAR | Status: AC
  Administered 2020-03-26: 06:00:00 2.5 mg via INTRAMUSCULAR
  Filled 2020-03-26: qty 2

## 2020-03-26 MED ORDER — POLYETHYLENE GLYCOL 3350 17 GM/SCOOP PO POWD: ORAL | 0 refills | Status: DC

## 2020-03-26 MED ORDER — LIDOCAINE HCL URETHRAL/MUCOSAL 2 % EX GEL
1.0000 "application " | Freq: Once | CUTANEOUS | Status: AC
  Administered 2020-03-26: 1 via TOPICAL
  Filled 2020-03-26: qty 30

## 2020-03-26 MED ORDER — LIDOCAINE 4 % EX OINT: 1.0000 "application " | TOPICAL_OINTMENT | Freq: Three times a day (TID) | CUTANEOUS | 0 refills | Status: AC | PRN

## 2020-03-26 NOTE — Progress Notes (Signed)
Patient had to come to hospital by ambulance this morning due to pain.  She is now home and she will contact DR Gross.

## 2020-03-26 NOTE — ED Provider Notes (Signed)
[facility]EMERGENCY DEPT Provider Note  CSN: [id number] Arrival date & time: 03/26/20 1443  Chief Complaint(s) Post-op Problem (surgical pain )  HPI [name] [name] is a 34 y.o. female with a past medical history listed below who had surgical repair of perirectal fistula, had hemorrhoidectomy and Bartholin cyst abscess I&D performed by surgery yesterday presents to the emergency department with intermittent rectal pain.  Patient feels that she has to have a bowel movement and is unable to.  She feels the urge to push which improves the pain.  She reports use of over-the-counter MiraLAX.  She has had a small bowel movement but still has tenesmus.  She is also endorsing discomfort from the Foley catheter and is requesting it be removed.  Other than that she denies any other physical complaints including fevers or chills.  No abdominal pain.  No nausea or vomiting.  No other physical complaints.  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   Substance abuse (HCC)    Suicide attempt (HCC) 06/19/2012   UTI (lower urinary tract infection) 06/19/2012   Patient Active Problem List   Diagnosis Date Noted   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 Authorizing Provider  albuterol (VENTOLIN HFA) 108 (90 Base) MCG/ACT inhaler Inhale 1-2 puffs into the lungs every 6 (six) hours as needed for wheezing or shortness of breath. Patient taking differently: Inhale 1-2 puffs into the lungs every 6 (six) hours as needed for wheezing or shortness of breath. Out of inhaler 07/03/19   Zadie Rhine, MD  Lidocaine 4 % OINT Apply 1 application topically 3 (three) times daily as needed for up to 10 days. 03/26/20 04/05/20  Nira Conn, MD  naproxen (NAPROSYN) 500 MG tablet Take 1 tablet (500 mg total) by mouth every 12 (twelve) hours as needed for mild pain or moderate pain. 03/25/20   Karie Soda, MD  oxyCODONE (OXY IR/ROXICODONE) 5 MG immediate release tablet Take 1-2 tablets (5-10 mg total) by mouth every 6 (six) hours as needed for moderate pain, severe pain or breakthrough pain. 03/25/20   Karie Soda, MD  polyethylene glycol powder (MIRALAX) 17 GM/SCOOP powder Start taking 1 capful 3 times a day. Slowly cut back as needed until you have normal bowel movements. 03/26/20   Nira Conn, MD  Past Surgical History Past Surgical History:  Procedure Laterality Date   DENTAL SURGERY     INCISE AND DRAIN ABCESS  01/16/2018   INCISION AND DRAINAGE PERIRECTAL ABSCESS N/A 01/16/2018   Procedure: IRRIGATION AND DEBRIDEMENT PERIRECTAL ABSCESS;  Surgeon: Sheliah Hatch De Blanch, MD;  Location: [facility] OR;  Service: General;  Laterality: N/A;   LAPAROSCOPIC TUBAL LIGATION Bilateral 02/05/2019   Procedure: LAPAROSCOPIC TUBAL LIGATION WITH CERVICAL POLYPECTOMY;  Surgeon: Allie Bossier, MD;  Location: [facility];  Service: Gynecology;  Laterality: Bilateral;   Family History Family History  Problem Relation Age of Onset   Hypertension Mother    Diabetes type II Mother    Arthritis Mother    Hyperlipidemia Mother    Cancer Father        lung    Social History Social History   Tobacco Use   Smoking status: Current Every Day Smoker    Packs/day: 0.00    Years: 9.00    Pack years: 0.00    Types: Cigarettes    Last attempt to quit: 01/14/2018    Years since quitting: 2.1   Smokeless tobacco: Never Used   Tobacco comment: 3 cigarettes per day  Substance Use Topics   Alcohol use: Not Currently    Comment: rare   Drug use: Not Currently    Frequency: 3.0 times per week    Types: Marijuana, Cocaine    Comment: last time was 2019 for cocaine, none since occ marijuana   Allergies Penicillins  Review of Systems Review of Systems All other systems are reviewed and are negative for acute change except as noted in the HPI  Physical Exam Vital Signs  I have reviewed the triage vital signs BP (!) 166/97    Pulse 73    Temp 98.3 F (36.8 C) (Oral)    Resp 16    LMP 03/08/2020    SpO2 99%   Physical Exam Vitals reviewed. Exam conducted with a chaperone present.  Constitutional:      General: She is not in acute distress.    Appearance: She is well-developed. She is not diaphoretic.  HENT:     Head: Normocephalic and atraumatic.     Right Ear: External ear normal.     Left Ear: External ear normal.     Nose: Nose normal.  Eyes:     General: No scleral icterus.    Conjunctiva/sclera: Conjunctivae normal.  Neck:     Trachea: Phonation normal.  Cardiovascular:     Rate and Rhythm: Normal rate and regular rhythm.  Pulmonary:     Effort: Pulmonary effort is normal. No respiratory distress.     Breath sounds: No stridor.  Abdominal:     General: There is no distension.  Genitourinary:   Musculoskeletal:        General: Normal range of motion.      Cervical back: Normal range of motion.  Neurological:     Mental Status: She is alert and oriented to person, place, and time.  Psychiatric:        Behavior: Behavior normal.     ED Results and Treatments Labs (all labs ordered are listed, but only abnormal results are displayed) Labs Reviewed - No data to display  EKG  EKG Interpretation  Date/Time:    Ventricular Rate:    PR Interval:    QRS Duration:   QT Interval:    QTC Calculation:   R Axis:     Text Interpretation:        Radiology No results found.  Pertinent labs & imaging results that were available during my care of the patient were reviewed by me and considered in my medical decision making (see chart for details).  Medications Ordered in ED Medications  lidocaine (XYLOCAINE) 2 % jelly 1 application (has no administration in time range)  diazepam (VALIUM) injection 2.5 mg (2.5 mg Intramuscular Given 03/26/20 0617)                                                                                                                                    Procedures Procedures  (including critical care time)  Medical Decision Making / ED Course I have reviewed the nursing notes for this encounter and the patient's prior records (if available in EHR or on provided paperwork).   Shakerria C Urquidi was evaluated in Emergency Department on 03/26/2020 for the symptoms described in the history of present illness. She was evaluated in the context of the global COVID-19 pandemic, which necessitated consideration that the patient might be at risk for infection with the SARS-CoV-2 virus that causes COVID-19. Institutional protocols and algorithms that pertain to the evaluation of patients at risk for COVID-19 are in a state of rapid change based on information released by regulatory bodies including the CDC and federal and  state organizations. These policies and algorithms were followed during the patient's care in the ED.  Patient presents with tenesmus following perirectal fistula surgery, hemorrhoidectomy, and Bartholin cyst I&D.  No evidence of superimposed infection or drainage..  Foley catheter removed per patient's request. Patient able to void. Provided with IM Valium for assistance with spasms, and topical lidocaine.  We will recommend MiraLAX regimen      Final Clinical Impression(s) / ED Diagnoses Final diagnoses:  Post-operative pain  Tenesmus (rectal)   The patient appears reasonably screened and/or stabilized for discharge and I doubt any other medical condition or other [facility] requiring further screening, evaluation, or treatment in the ED at this time prior to discharge. Safe for discharge with strict return precautions.  Disposition: Discharge  Condition: Good  I have discussed the results, Dx and Tx plan with the patient/family who expressed understanding and agree(s) with the plan. Discharge instructions discussed at length. The patient/family was given strict return precautions who verbalized understanding of the instructions. No further questions at time of discharge.    ED Discharge Orders         Ordered    polyethylene glycol powder (MIRALAX) 17 GM/SCOOP powder     03/26/20 0708    Lidocaine 4 % OINT  3 times daily PRN     03/26/20 0708  Follow Up: Karie Soda, MD [address] [place] [address] [phone]  Call  As needed      This chart was dictated using voice recognition software.  Despite best efforts to proofread,  errors can occur which can change the documentation meaning.   Nira Conn, MD 03/26/20 [phone]

## 2020-03-26 NOTE — ED Triage Notes (Signed)
Pt is a 34 yr old african [place] female with c/o of new on set pain from a  repair of a peri-rectal fissula done at [facility] long yesterday. Pt was woken up from sleep this morning at 4:30am with a sharp tearing sensation. Pt arrives with a foley cath and describes it is contributing to her discomfort.  Pt is alert and orientated. Pt took 1 oxycodone po with out relief and called ems. V/s on arrival 148/88, pluses 110, rr24, spo2 96 room air, 98.5 temp.

## 2020-04-21 ENCOUNTER — Other Ambulatory Visit: Payer: Self-pay

## 2020-04-21 ENCOUNTER — Ambulatory Visit (HOSPITAL_COMMUNITY)
Admission: EM | Admit: 2020-04-21 | Discharge: 2020-04-21 | Disposition: A | Discharge status: Home / Self Care | Payer: Medicaid Other | Attending: Family Medicine | Admitting: Family Medicine

## 2020-04-21 DIAGNOSIS — M79605 Pain in left leg: Secondary | ICD-10-CM

## 2020-04-21 MED ORDER — OXYCODONE-ACETAMINOPHEN 5-325 MG PO TABS: 2.0000 | ORAL_TABLET | Freq: Four times a day (QID) | ORAL | 0 refills | Status: DC | PRN

## 2020-04-21 NOTE — Discharge Instructions (Addendum)

## 2020-04-21 NOTE — ED Triage Notes (Signed)
Pt c/o L pain for years. Pt had surgery on 03/26/2020 for repair of perirectal fistula, hemorrhoidectomy and Bartholin cyst abscess I&D and states pain has been worse since then. Radiating from L hip down to L foot

## 2020-04-24 NOTE — ED Provider Notes (Signed)
[place]   [id number] 04/21/20 Arrival Time: 4970  ASSESSMENT & PLAN:  1. Left leg pain     Meds ordered this encounter  Medications  . oxyCODONE-acetaminophen (PERCOCET/ROXICET) 5-325 MG tablet    Sig: Take 2 tablets by mouth every 6 (six) hours as needed for severe pain.    Dispense:  10 tablet    Refill:  0   [place] Controlled Substances Registry consulted for this patient. I feel the risk/benefit ratio today is favorable for proceeding with this prescription for a controlled substance. Medication sedation precautions given.  Reports she has f/u with her surgeon tomorrow. Will keep appt.  Reviewed expectations re: course of current medical issues. Questions answered. Outlined signs and symptoms indicating need for more acute intervention. Understanding verbalized. After Visit Summary given.   SUBJECTIVE: History from: patient. [name] [name] is a 34 y.o. female who reports surgery on 03/26/2020 for repair of perirectal fistula, hemorrhoidectomy and Bartholin cyst abscess I&D. States that pain has been worse since, esp pain radiating down L leg. No extremity sensation changes or weakness. Afebrile. Normal PO intake without n/v. Reports normal bowel/bladder habits.    OBJECTIVE:  Vitals:   04/21/20 1519  BP: 113/77  Pulse: 65  Resp: 16  Temp: 98 F (36.7 C)  SpO2: 100%    General appearance: alert; no distress Eyes: PERRLA; EOMI; conjunctiva normal HENT: [place]; AT; nasal mucosa normal; oral mucosa normal Neck: supple  Lungs: speaks full sentences without difficulty; unlabored Extremities: no edema; does reports pain over post thigh with SLR; normal distal sensation and reflexes Skin: warm and dry Neurologic: normal gait Psychological: alert and cooperative; normal mood and affect   Allergies  Allergen Reactions  . Penicillins Nausea And Vomiting    Has patient had a PCN reaction causing immediate rash, facial/tongue/throat swelling, SOB or  lightheadedness with hypotension: No Has patient had a PCN reaction causing severe rash involving mucus membranes or skin necrosis: No Has patient had a PCN reaction that required hospitalization Yes Has patient had a PCN reaction occurring within the last 10 years: No If all of the above answers are "NO", then may proceed with Cephalosporin use.     Past Medical History:  Diagnosis Date  . Abscess of axilla   . Anal fissure 2014  . Anemia   . Anxiety   . Bipolar 1 disorder ([place])   . Chronic bronchitis ([place])   . COVID-19 02/28/2020   LOSS OF TASTE AND SMELL, NOW RESOLVED AFTER QUARANTINE  . Depression    bipolar; not on meds  . Headache(784.0)    migraine  . Heart murmur    as a child none now  . Hemorrhoids   . Hypokalemia 06/17/2012  . Perianal abscess 12/2017  . Severe major depression ([place]) 06/17/2012  . Substance abuse ([place])   . Suicide attempt ([place]) 06/19/2012  . UTI (lower urinary tract infection) 06/19/2012   Social History   Socioeconomic History  . Marital status: Single    Spouse name: Not on file  . Number of children: Not on file  . Years of education: Not on file  . Highest education level: Not on file  Occupational History  . Not on file  Tobacco Use  . Smoking status: Current Every Day Smoker    Packs/day: 0.00    Years: 9.00    Pack years: 0.00    Types: Cigarettes    Last attempt to quit: 01/14/2018    Years since quitting: 2.2  .  Smokeless tobacco: Never Used  . Tobacco comment: 3 cigarettes per day  Substance and Sexual Activity  . Alcohol use: Not Currently    Comment: rare  . Drug use: Not Currently    Frequency: 3.0 times per week    Types: Marijuana, Cocaine    Comment: last time was 2019 for cocaine, none since occ marijuana  . Sexual activity: Yes    Birth control/protection: None  Other Topics Concern  . Not on file  Social History Narrative  . Not on file   Social Determinants of Health   Financial Resource Strain:   . Difficulty  of Paying Living Expenses:   Food Insecurity:   . Worried About Programme researcher, broadcasting/film/video in the Last Year:   . Barista in the Last Year:   Transportation Needs:   . Freight forwarder (Medical):   Marland Kitchen Lack of Transportation (Non-Medical):   Physical Activity:   . Days of Exercise per Week:   . Minutes of Exercise per Session:   Stress:   . Feeling of Stress :   Social Connections:   . Frequency of Communication with Friends and Family:   . Frequency of Social Gatherings with Friends and Family:   . Attends Religious Services:   . Active Member of Clubs or Organizations:   . Attends Banker Meetings:   Marland Kitchen Marital Status:   Intimate Partner Violence:   . Fear of Current or Ex-Partner:   . Emotionally Abused:   Marland Kitchen Physically Abused:   . Sexually Abused:    Family History  Problem Relation Age of Onset  . Hypertension Mother   . Diabetes type II Mother   . Arthritis Mother   . Hyperlipidemia Mother   . Cancer Father        lung   Past Surgical History:  Procedure Laterality Date  . DENTAL SURGERY    . HEMORRHOID SURGERY N/A 03/25/2020   Procedure: HEMORRHOIDECTOMY;  Surgeon: Karie Soda, MD;  Location: [facility];  Service: General;  Laterality: N/A;  . INCISE AND DRAIN ABCESS  01/16/2018  . INCISION AND DRAINAGE PERIRECTAL ABSCESS N/A 01/16/2018   Procedure: IRRIGATION AND DEBRIDEMENT PERIRECTAL ABSCESS;  Surgeon: Sheliah Hatch, De Blanch, MD;  Location[facility];  Service: General;  Laterality: N/A;  . LAPAROSCOPIC TUBAL LIGATION Bilateral 02/05/2019   Procedure: LAPAROSCOPIC TUBAL LIGATION WITH CERVICAL POLYPECTOMY;  Surgeon: Allie Bossier, MD;  Location: [facility];  Service: Gynecology;  Laterality: Bilateral;  . RECTAL EXAM UNDER ANESTHESIA N/A 03/25/2020   Procedure: ANORECTAL EXAM UNDER ANESTHESIA, REPAIR OF PERIRECTAL FISTULA;  Surgeon: Karie Soda, MD;  Location: [facility];  Service: General;  Laterality: Vertis Kelch, MD 04/24/20 1004

## 2020-10-22 ENCOUNTER — Encounter (HOSPITAL_COMMUNITY): Payer: Self-pay

## 2020-10-22 ENCOUNTER — Other Ambulatory Visit: Payer: Self-pay

## 2020-10-22 ENCOUNTER — Ambulatory Visit (HOSPITAL_COMMUNITY)
Admission: EM | Admit: 2020-10-22 | Discharge: 2020-10-22 | Disposition: A | Discharge status: Home / Self Care | Payer: Medicaid Other | Attending: Physician Assistant | Admitting: Physician Assistant

## 2020-10-22 DIAGNOSIS — G8929 Other chronic pain: Secondary | ICD-10-CM

## 2020-10-22 DIAGNOSIS — M25512 Pain in left shoulder: Secondary | ICD-10-CM

## 2020-10-22 MED ORDER — NAPROXEN 500 MG PO TABS: 500.0000 mg | ORAL_TABLET | Freq: Two times a day (BID) | ORAL | 0 refills | Status: DC | PRN

## 2020-10-22 MED ORDER — CYCLOBENZAPRINE HCL 5 MG PO TABS: 5.0000 mg | ORAL_TABLET | Freq: Three times a day (TID) | ORAL | 0 refills | Status: DC | PRN

## 2020-10-22 MED ORDER — NAPROXEN 500 MG PO TABS: 500.0000 mg | ORAL_TABLET | Freq: Two times a day (BID) | ORAL | 0 refills | Status: DC

## 2020-10-22 NOTE — ED Triage Notes (Signed)
Pt present left shoulder pain. Pt states this is a WC and she was not able to go to work today because of the amount of pain.

## 2020-10-22 NOTE — Discharge Instructions (Signed)
Follow up with ortho on 11/17 Take medications as prescribed

## 2020-10-22 NOTE — ED Provider Notes (Signed)
[facility]    CSN: [id number] Arrival date & time: 10/22/20  1556      History   Chief Complaint Chief Complaint  Patient presents with  . Shoulder Pain    HPI [name] [name] is a 34 y.o. female.   Pt complains of left shoulder pain.  Reports she experienced a work injury 3/21.  She reports she has been working with PT and has retruned to work.  This past Monday she went to pick up a 50lb box which slipped, experienced increaseed left shoulder pain.  Pt works at [employer].  She reports pain with work over the last few days, lifting boxes.  She has been released to full duty. Continues with PT, was seen yesterday.  Has an appointment 11/17 with orthopedics. t feels she cannot lift at this point due to recent flare up.  She is currently taken nothing for the pain.  Unable to work today due to pain. Pt is right handed.      Past Medical History:  Diagnosis Date  . Abscess of axilla   . Anal fissure 2014  . Anemia   . Anxiety   . Bipolar 1 disorder ([facility])   . Chronic bronchitis ([facility])   . COVID-19 02/28/2020   LOSS OF TASTE AND SMELL, NOW RESOLVED AFTER QUARANTINE  . Depression    bipolar; not on meds  . Headache(784.0)    migraine  . Heart murmur    as a child none now  . Hemorrhoids   . Hypokalemia 06/17/2012  . Perianal abscess 12/2017  . 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  . Substance abuse ([facility])   . Suicide attempt ([facility]) 06/19/2012  . UTI (lower urinary tract infection) 06/19/2012    Patient Active Problem List   Diagnosis Date Noted  . 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 Hemorrhoids   . ASCUS with positive high risk HPV cervical 11/25/2018  . Rubella non-immune status, antepartum 11/25/2018  . Trichomonas infection 11/25/2018  . Iron deficiency anemia due to chronic blood loss 07/04/2018  . Perianal abscess 07/04/2018  . Inadequate social support 07/04/2018  . History of Cocaine abuse affecting pregnancy in third trimester ([facility]) 04/2016 04/27/2016  .  Tobacco abuse 06/17/2012    Past Surgical History:  Procedure Laterality Date  . DENTAL SURGERY    . HEMORRHOID SURGERY N/A 03/25/2020   Procedure: HEMORRHOIDECTOMY;  Surgeon: Karie Soda, MD;  Location[facility];  Service: General;  Laterality: N/A;  . INCISE AND DRAIN ABCESS  01/16/2018  . INCISION AND DRAINAGE PERIRECTAL ABSCESS N/A 01/16/2018   Procedure: IRRIGATION AND DEBRIDEMENT PERIRECTAL ABSCESS;  Surgeon: Sheliah Hatch, De Blanch, MD;  Location: [facility] OR;  Service: General;  Laterality: N/A;  . LAPAROSCOPIC TUBAL LIGATION Bilateral 02/05/2019   Procedure: LAPAROSCOPIC TUBAL LIGATION WITH CERVICAL POLYPECTOMY;  Surgeon: Allie Bossier, MD;  Location: [facility];  Service: Gynecology;  Laterality: Bilateral;  . RECTAL EXAM UNDER ANESTHESIA N/A 03/25/2020   Procedure: ANORECTAL EXAM UNDER ANESTHESIA, REPAIR OF PERIRECTAL FISTULA;  Surgeon: Karie Soda, MD;  Location: [facility];  Service: General;  Laterality: N/A;    OB History    Gravida  5   Para  4   Term  3   Preterm  1   AB  1   Living  4     SAB  1   TAB  0   Ectopic  0   Multiple  0   Live Births  4  Home Medications    Prior to Admission medications   Medication Sig Start Date End Date Taking? Authorizing Provider  albuterol (VENTOLIN HFA) 108 (90 Base) MCG/ACT inhaler Inhale 1-2 puffs into the lungs every 6 (six) hours as needed for wheezing or shortness of breath. Patient taking differently: Inhale 1-2 puffs into the lungs every 6 (six) hours as needed for wheezing or shortness of breath. Out of inhaler 07/03/19   Zadie Rhine, MD  naproxen (NAPROSYN) 500 MG tablet Take 1 tablet (500 mg total) by mouth every 12 (twelve) hours as needed for mild pain or moderate pain. 03/25/20   Karie Soda, MD  oxyCODONE-acetaminophen (PERCOCET/ROXICET) 5-325 MG tablet Take 2 tablets by mouth every 6 (six) hours as needed for severe pain. 04/21/20   Mardella Layman, MD  polyethylene glycol powder (MIRALAX) 17 GM/SCOOP powder Start taking 1 capful 3 times a day. Slowly cut back as needed until you have normal bowel movements. 03/26/20   Nira Conn, MD    Family History Family History  Problem Relation Age of Onset  . Hypertension Mother   . Diabetes type II Mother   . Arthritis Mother   . Hyperlipidemia Mother   . Cancer Father        lung    Social History Social History   Tobacco Use  . Smoking status: Current Every Day Smoker    Packs/day: 0.00    Years: 9.00    Pack years: 0.00    Types: Cigarettes    Last attempt to quit: 01/14/2018    Years since quitting: 2.7  . Smokeless tobacco: Never Used  . Tobacco comment: 3 cigarettes per day  Vaping Use  . Vaping Use: Every day  . Substances: Nicotine, Flavoring  Substance Use Topics  . Alcohol use: Not Currently    Comment: rare  . Drug use: Not Currently    Frequency: 3.0 times per week    Types: Marijuana, Cocaine    Comment: last time was 2019 for cocaine, none since occ marijuana     Allergies   Penicillins   Review of Systems Review of Systems  Constitutional: Negative for chills and fever.  HENT: Negative for ear pain and sore throat.   Eyes: Negative for pain and visual disturbance.  Respiratory: Negative for cough and shortness of breath.   Cardiovascular: Negative for chest pain and palpitations.  Gastrointestinal: Negative for abdominal pain and vomiting.  Genitourinary: Negative for dysuria and hematuria.  Musculoskeletal: Positive for arthralgias (left shoulder pain). Negative for back pain.  Skin: Negative for color change, rash and wound.  Neurological: Negative for seizures and syncope.  All other systems reviewed and are negative.    Physical Exam Triage Vital Signs ED Triage Vitals  Enc Vitals Group     BP 10/22/20 1700 99/78     Pulse Rate 10/22/20 1700 79     Resp 10/22/20 1700 16     Temp 10/22/20 1700 98.9 F (37.2 C)      Temp Source 10/22/20 1700 Oral     SpO2 10/22/20 1700 100 %     Weight --      Height --      Head Circumference --      Peak Flow --      Pain Score 10/22/20 1703 10     Pain Loc --      Pain Edu? --      Excl. in GC? --    No data found.  Updated  Vital Signs BP 99/78 (BP Location: Right Arm)   Pulse 79   Temp 98.9 F (37.2 C) (Oral)   Resp 16   SpO2 100%   Visual Acuity Right Eye Distance:   Left Eye Distance:   Bilateral Distance:    Right Eye Near:   Left Eye Near:    Bilateral Near:     Physical Exam Vitals and nursing note reviewed.  Constitutional:      General: She is not in acute distress.    Appearance: She is well-developed.  HENT:     Head: Normocephalic and atraumatic.  Eyes:     Conjunctiva/sclera: Conjunctivae normal.  Cardiovascular:     Rate and Rhythm: Normal rate and regular rhythm.     Heart sounds: No murmur heard.   Pulmonary:     Effort: Pulmonary effort is normal. No respiratory distress.     Breath sounds: Normal breath sounds.  Abdominal:     Palpations: Abdomen is soft.     Tenderness: There is no abdominal tenderness.  Musculoskeletal:     Right shoulder: Normal.     Left shoulder: Tenderness (Diffuse tenderness to palpation to left trapezius and anterior shoulder joint) present. No swelling, deformity or bony tenderness. Decreased range of motion (decreased ROM with extension ). Normal strength. Normal pulse.     Cervical back: Neck supple.  Skin:    General: Skin is warm and dry.  Neurological:     Mental Status: She is alert.      UC Treatments / Results  Labs (all labs ordered are listed, but only abnormal results are displayed) Labs Reviewed - No data to display  EKG   Radiology No results found.  Procedures Procedures (including critical care time)  Medications Ordered in UC Medications - No data to display  Initial Impression / Assessment and Plan / UC Course  I have reviewed the triage vital signs and  the nursing notes.  Pertinent labs & imaging results that were available during my care of the patient were reviewed by me and considered in my medical decision making (see chart for details).     Pt with current WC case regarding left shoulder injury which occurred in March, released to full duty with recent flair up after a box slipped while she was lifting, box around 50lbs.  She has a follow up appointment on 11/17 with ortho.  Will keep this appointment.  Unable to go to work today due to increased pain.  Naproxyn and flexeril given today.  Note given for today.  Final Clinical Impressions(s) / UC Diagnoses   Final diagnoses:  None   Discharge Instructions   None    ED Prescriptions    None     PDMP not reviewed this encounter.   Jodell Cipro, PA-C 10/22/20 1724

## 2020-12-02 ENCOUNTER — Encounter (HOSPITAL_COMMUNITY): Payer: Self-pay | Admitting: Emergency Medicine

## 2020-12-02 ENCOUNTER — Other Ambulatory Visit: Payer: Self-pay

## 2020-12-02 ENCOUNTER — Ambulatory Visit (HOSPITAL_COMMUNITY)
Admission: EM | Admit: 2020-12-02 | Discharge: 2020-12-02 | Disposition: A | Discharge status: Home / Self Care | Payer: Medicaid Other | Attending: Family Medicine | Admitting: Family Medicine

## 2020-12-02 DIAGNOSIS — J069 Acute upper respiratory infection, unspecified: Secondary | ICD-10-CM | POA: Insufficient documentation

## 2020-12-02 DIAGNOSIS — J019 Acute sinusitis, unspecified: Secondary | ICD-10-CM | POA: Diagnosis present

## 2020-12-02 DIAGNOSIS — Z20822 Contact with and (suspected) exposure to covid-19: Secondary | ICD-10-CM | POA: Diagnosis not present

## 2020-12-02 DIAGNOSIS — Z79899 Other long term (current) drug therapy: Secondary | ICD-10-CM | POA: Insufficient documentation

## 2020-12-02 DIAGNOSIS — B9689 Other specified bacterial agents as the cause of diseases classified elsewhere: Secondary | ICD-10-CM | POA: Insufficient documentation

## 2020-12-02 DIAGNOSIS — F17210 Nicotine dependence, cigarettes, uncomplicated: Secondary | ICD-10-CM | POA: Diagnosis not present

## 2020-12-02 LAB — RESP PANEL BY RT-PCR (FLU A&B, COVID) ARPGX2
Influenza A by PCR: NEGATIVE
Influenza B by PCR: NEGATIVE
SARS Coronavirus 2 by RT PCR: NEGATIVE

## 2020-12-02 MED ORDER — BENZONATATE 100 MG PO CAPS: 100.0000 mg | ORAL_CAPSULE | Freq: Three times a day (TID) | ORAL | 0 refills | Status: DC

## 2020-12-02 MED ORDER — DOXYCYCLINE HYCLATE 100 MG PO CAPS: 100.0000 mg | ORAL_CAPSULE | Freq: Two times a day (BID) | ORAL | 0 refills | Status: DC

## 2020-12-02 NOTE — ED Provider Notes (Signed)
[facility]    CSN: [id number] Arrival date & time: 12/02/20  1803      History   Chief Complaint Chief Complaint  Patient presents with  . Cough  . Sore Throat  . Nasal Congestion  . Fever    HPI [name] [name] is a 34 y.o. female presenting for sore throat, nasal congestion, cough, chills for 4 days. Her job is requesting covid test. She has not been vaccinated for covid-19. Denies n/v/d, shortness of breath, chest pain,  teeth pain, headaches, sore throat, loss of taste/smell, swollen lymph nodes, ear pain, fevers.   HPI  Past Medical History:  Diagnosis Date  . Abscess of axilla   . Anal fissure 2014  . Anemia   . Anxiety   . Bipolar 1 disorder (HCC)   . Chronic bronchitis (HCC)   . COVID-19 02/28/2020   LOSS OF TASTE AND SMELL, NOW RESOLVED AFTER QUARANTINE  . Depression    bipolar; not on meds  . Headache(784.0)    migraine  . Heart murmur    as a child none now  . Hemorrhoids   . Hypokalemia 06/17/2012  . Perianal abscess 12/2017  . 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  . Substance abuse (HCC)   . Suicide attempt (HCC) 06/19/2012  . UTI (lower urinary tract infection) 06/19/2012    Patient Active Problem List   Diagnosis Date Noted  . 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 Hemorrhoids   . ASCUS with positive high risk HPV cervical 11/25/2018  . Rubella non-immune status, antepartum 11/25/2018  . Trichomonas infection 11/25/2018  . Iron deficiency anemia due to chronic blood loss 07/04/2018  . Perianal abscess 07/04/2018  . Inadequate social support 07/04/2018  . History of Cocaine abuse affecting pregnancy in third trimester (HCC) 04/2016 04/27/2016  . Tobacco abuse 06/17/2012    Past Surgical History:  Procedure Laterality Date  . DENTAL SURGERY    . HEMORRHOID SURGERY N/A 03/25/2020   Procedure: HEMORRHOIDECTOMY;  Surgeon: Karie Soda, MD;  Location: [facility];  Service: General;  Laterality: N/A;  . INCISE AND DRAIN  ABCESS  01/16/2018  . INCISION AND DRAINAGE PERIRECTAL ABSCESS N/A 01/16/2018   Procedure: IRRIGATION AND DEBRIDEMENT PERIRECTAL ABSCESS;  Surgeon: Sheliah Hatch, De Blanch, MD;  Location: [facility] OR;  Service: General;  Laterality: N/A;  . LAPAROSCOPIC TUBAL LIGATION Bilateral 02/05/2019   Procedure: LAPAROSCOPIC TUBAL LIGATION WITH CERVICAL POLYPECTOMY;  Surgeon: Allie Bossier, MD;  Location: [facility];  Service: Gynecology;  Laterality: Bilateral;  . RECTAL EXAM UNDER ANESTHESIA N/A 03/25/2020   Procedure: ANORECTAL EXAM UNDER ANESTHESIA, REPAIR OF PERIRECTAL FISTULA;  Surgeon: Karie Soda, MD;  Location: [facility];  Service: General;  Laterality: N/A;    OB History    Gravida  5   Para  4   Term  3   Preterm  1   AB  1   Living  4     SAB  1   IAB  0   Ectopic  0   Multiple  0   Live Births  4            Home Medications    Prior to Admission medications   Medication Sig Start Date End Date Taking? Authorizing Provider  benzonatate (TESSALON) 100 MG capsule Take 1 capsule (100 mg total) by mouth every 8 (eight) hours. 12/02/20   Rhys Martini, PA-C  cyclobenzaprine (FLEXERIL) 5 MG tablet Take 1 tablet (5 mg total)  by mouth 3 (three) times daily as needed for muscle spasms. 10/22/20   Jodell Cipro, PA-C  doxycycline (VIBRAMYCIN) 100 MG capsule Take 1 capsule (100 mg total) by mouth 2 (two) times daily. 12/02/20   Rhys Martini, PA-C  naproxen (NAPROSYN) 500 MG tablet Take 1 tablet (500 mg total) by mouth 2 (two) times daily. 10/22/20   Zanetto, Shanda Bumps, PA-C  naproxen (NAPROSYN) 500 MG tablet Take 1 tablet (500 mg total) by mouth every 12 (twelve) hours as needed for mild pain or moderate pain. 10/22/20   Jodell Cipro, PA-C  oxyCODONE-acetaminophen (PERCOCET/ROXICET) 5-325 MG tablet Take 2 tablets by mouth every 6 (six) hours as needed for severe pain. 04/21/20   Mardella Layman, MD  polyethylene glycol powder (MIRALAX) 17 GM/SCOOP powder  Start taking 1 capful 3 times a day. Slowly cut back as needed until you have normal bowel movements. 03/26/20   Nira Conn, MD  albuterol (VENTOLIN HFA) 108 (90 Base) MCG/ACT inhaler Inhale 1-2 puffs into the lungs every 6 (six) hours as needed for wheezing or shortness of breath. Patient taking differently: Inhale 1-2 puffs into the lungs every 6 (six) hours as needed for wheezing or shortness of breath. Out of inhaler 07/03/19 12/02/20  Zadie Rhine, MD    Family History Family History  Problem Relation Age of Onset  . Hypertension Mother   . Diabetes type II Mother   . Arthritis Mother   . Hyperlipidemia Mother   . Cancer Father        lung    Social History Social History   Tobacco Use  . Smoking status: Current Every Day Smoker    Packs/day: 0.00    Years: 9.00    Pack years: 0.00    Types: Cigarettes    Last attempt to quit: 01/14/2018    Years since quitting: 2.8  . Smokeless tobacco: Never Used  . Tobacco comment: 3 cigarettes per day  Vaping Use  . Vaping Use: Every day  . Substances: Nicotine, Flavoring  Substance Use Topics  . Alcohol use: Not Currently    Comment: rare  . Drug use: Not Currently    Frequency: 3.0 times per week    Types: Marijuana, Cocaine    Comment: last time was 2019 for cocaine, none since occ marijuana     Allergies   Penicillins   Review of Systems Review of Systems  Constitutional: Positive for chills.  HENT: Positive for congestion, sinus pressure, sinus pain and sore throat.   Respiratory: Positive for cough.   All other systems reviewed and are negative.    Physical Exam Triage Vital Signs ED Triage Vitals  Enc Vitals Group     BP 12/02/20 1927 108/66     Pulse Rate 12/02/20 1927 74     Resp 12/02/20 1927 18     Temp 12/02/20 1927 98.5 F (36.9 C)     Temp Source 12/02/20 1927 Oral     SpO2 12/02/20 1927 98 %     Weight --      Height --      Head Circumference --      Peak Flow --      Pain  Score 12/02/20 1924 0     Pain Loc --      Pain Edu? --      Excl. in GC? --    No data found.  Updated Vital Signs BP 108/66 (BP Location: Left Arm)   Pulse 74   Temp 98.5  F (36.9 C) (Oral)   Resp 18   LMP 11/24/2020   SpO2 98%   Visual Acuity Right Eye Distance:   Left Eye Distance:   Bilateral Distance:    Right Eye Near:   Left Eye Near:    Bilateral Near:     Physical Exam Vitals reviewed.  Constitutional:      General: She is not in acute distress.    Appearance: Normal appearance. She is well-developed. She is not ill-appearing.  HENT:     Head: Normocephalic and atraumatic.     Right Ear: Hearing, tympanic membrane, ear canal and external ear normal. No drainage. No middle ear effusion. Tympanic membrane is not perforated, erythematous, retracted or bulging.     Left Ear: Hearing, tympanic membrane, ear canal and external ear normal. No drainage.  No middle ear effusion. Tympanic membrane is not perforated, erythematous, retracted or bulging.     Nose:     Right Sinus: Maxillary sinus tenderness and frontal sinus tenderness present.     Left Sinus: Maxillary sinus tenderness and frontal sinus tenderness present.     Mouth/Throat:     Mouth: Mucous membranes are moist.     Pharynx: Uvula midline. No oropharyngeal exudate, posterior oropharyngeal erythema or uvula swelling.     Tonsils: No tonsillar exudate.  Cardiovascular:     Rate and Rhythm: Normal rate and regular rhythm.     Heart sounds: Normal heart sounds.  Pulmonary:     Effort: Pulmonary effort is normal.     Breath sounds: Normal breath sounds and air entry.  Abdominal:     General: Bowel sounds are normal.     Palpations: Abdomen is soft.     Tenderness: There is no abdominal tenderness. There is no guarding or rebound. Negative signs include Murphy's sign and McBurney's sign.  Lymphadenopathy:     Cervical: No cervical adenopathy.  Neurological:     General: No focal deficit present.      Mental Status: She is alert.  Psychiatric:        Attention and Perception: Attention and perception normal.        Mood and Affect: Mood and affect normal.        Behavior: Behavior is cooperative.      UC Treatments / Results  Labs (all labs ordered are listed, but only abnormal results are displayed) Labs Reviewed  RESP PANEL BY RT-PCR (FLU A&B, COVID) [id number]    EKG   Radiology No results found.  Procedures Procedures (including critical care time)  Medications Ordered in UC Medications - No data to display  Initial Impression / Assessment and Plan / UC Course  I have reviewed the triage vital signs and the nursing notes.  Pertinent labs & imaging results that were available during my care of the patient were reviewed by me and considered in my medical decision making (see chart for details).     Covid and influenza tests sent. Pt is penicillin allergic; doxycycline sent as below for sinusitis. Trial of tessalon prn for coug. She can also try symptomatic treatment with Mucinex, tylenol, etc. Return precautions- chest pain, shortness of breath, new/worsening fevers/chills, confusion, worsening of symptoms despite the above treatment plan, etc.   She understands to isolate until test results for covid come back.   Final Clinical Impressions(s) / UC Diagnoses   Final diagnoses:  Acute bacterial sinusitis  Acute upper respiratory infection     Discharge Instructions     Start the antibiotic (  doxycycline) for your sinus infection. You can also try the Tessalon for your cough. You can use over the counter medications like Mucinex for congestion.   We'll call you if your covid test is positive. You can also call this clinic and ask for your results.   Seek additional medical attention if you have shortness of breath, new/worsening fevers/chills, chest pain, etc.     ED Prescriptions    Medication Sig Dispense Auth. Provider   benzonatate (TESSALON) 100 MG  capsule Take 1 capsule (100 mg total) by mouth every 8 (eight) hours. 21 capsule Rhys Martini, PA-C   doxycycline (VIBRAMYCIN) 100 MG capsule Take 1 capsule (100 mg total) by mouth 2 (two) times daily. 20 capsule Rhys Martini, PA-C     PDMP not reviewed this encounter.   Rhys Martini, PA-C 12/02/20 2102

## 2020-12-02 NOTE — ED Triage Notes (Signed)
Pt c/o fever, nasal congestion, cough, and sore throat x 3 days. Pt states that her job is requesting covid test.

## 2020-12-02 NOTE — Discharge Instructions (Signed)
Start the antibiotic (doxycycline) for your sinus infection. You can also try the Tessalon for your cough. You can use over the counter medications like Mucinex for congestion.   We'll call you if your covid test is positive. You can also call this clinic and ask for your results.   Seek additional medical attention if you have shortness of breath, new/worsening fevers/chills, chest pain, etc.

## 2021-05-26 ENCOUNTER — Other Ambulatory Visit: Payer: Self-pay

## 2021-05-26 ENCOUNTER — Encounter (HOSPITAL_COMMUNITY): Payer: Self-pay

## 2021-05-26 ENCOUNTER — Ambulatory Visit (HOSPITAL_COMMUNITY)
Admission: EM | Admit: 2021-05-26 | Discharge: 2021-05-26 | Disposition: A | Discharge status: Home / Self Care | Payer: BLUE CROSS/BLUE SHIELD | Attending: Physician Assistant | Admitting: Physician Assistant

## 2021-05-26 DIAGNOSIS — N941 Unspecified dyspareunia: Secondary | ICD-10-CM | POA: Insufficient documentation

## 2021-05-26 DIAGNOSIS — N898 Other specified noninflammatory disorders of vagina: Secondary | ICD-10-CM | POA: Insufficient documentation

## 2021-05-26 DIAGNOSIS — N939 Abnormal uterine and vaginal bleeding, unspecified: Secondary | ICD-10-CM | POA: Diagnosis not present

## 2021-05-26 LAB — POCT URINALYSIS DIPSTICK, ED / UC
Bilirubin Urine: NEGATIVE
Glucose, UA: NEGATIVE mg/dL
Hgb urine dipstick: NEGATIVE
Leukocytes,Ua: NEGATIVE
Nitrite: NEGATIVE
Protein, ur: NEGATIVE mg/dL
Specific Gravity, Urine: 1.02 (ref 1.005–1.030)
Urobilinogen, UA: >=8 mg/dL (ref 0.0–1.0)
pH: 7 (ref 5.0–8.0)

## 2021-05-26 LAB — POC URINE PREG, ED: Preg Test, Ur: NEGATIVE

## 2021-05-26 MED ORDER — METRONIDAZOLE 500 MG PO TABS: 500.0000 mg | ORAL_TABLET | Freq: Two times a day (BID) | ORAL | 0 refills | Status: DC

## 2021-05-26 NOTE — Discharge Instructions (Addendum)
Your exam was normal today.  I do not have an explanation for your bleeding. We will be in touch with your STI swab results soon as we have them.  We are treating you for bacterial vaginosis.  Please take metronidazole twice daily for 7 days.  Do not drink any alcohol while taking this medication for 3 days after completing course as it will cause you to vomit.  Please follow-up with OB/GYN as we discussed.  Return if you have any worsening symptoms.

## 2021-05-26 NOTE — ED Triage Notes (Signed)
Pt in with c/o vaginal bleeding that started light in march but has worsened and been happening more frequently  Pt also c/o heavy vaginal discharge  Pt states she had surgery last year due to her intestines coming through her vagina and thinks this may be related

## 2021-05-26 NOTE — ED Provider Notes (Signed)
[facility]    CSN: [id number] Arrival date & time: 05/26/21  1454      History   Chief Complaint Chief Complaint  Patient presents with   Vaginal Bleeding    HPI [name] [name] is a 35 y.o. female.   Patient presents today with a several month history of intermittent vaginal spotting and bleeding.  She describes this as light but at times will have heavier bleeding without identifiable trigger.  She is not currently taking any contraception as she is status post tubal ligation.  She reports associated dyspareunia as well as vaginal discharge.  She describes discharge as thick and mucus-like with a mild odor.  She has no concern for STI but is open to testing today.  She does have a history of perianal fistula that was repaired in [place] 2021 and she is concerned that symptoms could be related to this previous surgery.  She denies any pelvic pain, nausea, vomiting, abdominal pain, urinary symptoms.  She denies any history of bleeding disorder.  She takes no blood thinning medication.  Denies any melena, hematochezia, increased bruising.   Past Medical History:  Diagnosis Date   Abscess of axilla    Anal fissure 2014   Anemia    Anxiety    Bipolar 1 disorder (HCC)    Chronic bronchitis (HCC)    COVID-19 02/28/2020   LOSS OF TASTE AND SMELL, NOW RESOLVED AFTER QUARANTINE   Depression    bipolar; not on meds   Headache(784.0)    migraine   Heart murmur    as a child none now   Hemorrhoids    Hypokalemia 06/17/2012   Perianal abscess 12/2017   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   Substance abuse (HCC)    Suicide attempt (HCC) 06/19/2012   UTI (lower urinary tract infection) 06/19/2012    Patient Active Problem List   Diagnosis Date Noted   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   Hemorrhoids    ASCUS with positive high risk HPV cervical 11/25/2018   Rubella non-immune status, antepartum 11/25/2018   Trichomonas infection 11/25/2018   Iron deficiency anemia due to  chronic blood loss 07/04/2018   Perianal abscess 07/04/2018   Inadequate social support 07/04/2018   History of Cocaine abuse affecting pregnancy in third trimester (HCC) 04/2016 04/27/2016   Tobacco abuse 06/17/2012    Past Surgical History:  Procedure Laterality Date   DENTAL SURGERY     HEMORRHOID SURGERY N/A 03/25/2020   Procedure: HEMORRHOIDECTOMY;  Surgeon: Karie SodaGross, Steven, MD;  [facility];  Service: General;  Laterality: N/A;   INCISE AND DRAIN ABCESS  01/16/2018   INCISION AND DRAINAGE PERIRECTAL ABSCESS N/A 01/16/2018   Procedure: IRRIGATION AND DEBRIDEMENT PERIRECTAL ABSCESS;  Surgeon: Rodman PickleKinsinger, Luke Aaron, MD;  Location: [facility];  Service: General;  Laterality: N/A;   LAPAROSCOPIC TUBAL LIGATION Bilateral 02/05/2019   Procedure: LAPAROSCOPIC TUBAL LIGATION WITH CERVICAL POLYPECTOMY;  Surgeon: Allie Bossierove, Myra C, MD;  Location: [facility];  Service: Gynecology;  Laterality: Bilateral;   RECTAL EXAM UNDER ANESTHESIA N/A 03/25/2020   Procedure: ANORECTAL EXAM UNDER ANESTHESIA, REPAIR OF PERIRECTAL FISTULA;  Surgeon: Karie SodaGross, Steven, MD;  Location: [facility];  Service: General;  Laterality: N/A;    OB History     Gravida  5   Para  4   Term  3   Preterm  1   AB  1   Living  4      SAB  1  IAB  0   Ectopic  0   Multiple  0   Live Births  4            Home Medications    Prior to Admission medications   Medication Sig Start Date End Date Taking? Authorizing Provider  metroNIDAZOLE (FLAGYL) 500 MG tablet Take 1 tablet (500 mg total) by mouth 2 (two) times daily. 05/26/21  Yes Demetry Bendickson K, PA-C  benzonatate (TESSALON) 100 MG capsule Take 1 capsule (100 mg total) by mouth every 8 (eight) hours. 12/02/20   Rhys Martini, PA-C  cyclobenzaprine (FLEXERIL) 5 MG tablet Take 1 tablet (5 mg total) by mouth 3 (three) times daily as needed for muscle spasms. 10/22/20   Jodell Cipro, PA-C  doxycycline (VIBRAMYCIN) 100  MG capsule Take 1 capsule (100 mg total) by mouth 2 (two) times daily. 12/02/20   Rhys Martini, PA-C  naproxen (NAPROSYN) 500 MG tablet Take 1 tablet (500 mg total) by mouth 2 (two) times daily. 10/22/20   Zanetto, Shanda Bumps, PA-C  naproxen (NAPROSYN) 500 MG tablet Take 1 tablet (500 mg total) by mouth every 12 (twelve) hours as needed for mild pain or moderate pain. 10/22/20   Jodell Cipro, PA-C  oxyCODONE-acetaminophen (PERCOCET/ROXICET) 5-325 MG tablet Take 2 tablets by mouth every 6 (six) hours as needed for severe pain. 04/21/20   Mardella Layman, MD  polyethylene glycol powder (MIRALAX) 17 GM/SCOOP powder Start taking 1 capful 3 times a day. Slowly cut back as needed until you have normal bowel movements. 03/26/20   Nira Conn, MD  albuterol (VENTOLIN HFA) 108 (90 Base) MCG/ACT inhaler Inhale 1-2 puffs into the lungs every 6 (six) hours as needed for wheezing or shortness of breath. Patient taking differently: Inhale 1-2 puffs into the lungs every 6 (six) hours as needed for wheezing or shortness of breath. Out of inhaler 07/03/19 12/02/20  Zadie Rhine, MD    Family History Family History  Problem Relation Age of Onset   Hypertension Mother    Diabetes type II Mother    Arthritis Mother    Hyperlipidemia Mother    Cancer Father        lung    Social History Social History   Tobacco Use   Smoking status: Every Day    Packs/day: 0.00    Years: 9.00    Pack years: 0.00    Types: Cigarettes    Last attempt to quit: 01/14/2018    Years since quitting: 3.3   Smokeless tobacco: Never   Tobacco comments:    3 cigarettes per day  Vaping Use   Vaping Use: Every day   Substances: Nicotine, Flavoring  Substance Use Topics   Alcohol use: Not Currently    Comment: rare   Drug use: Not Currently    Frequency: 3.0 times per week    Types: Marijuana, Cocaine    Comment: last time was 2019 for cocaine, none since occ marijuana     Allergies   Penicillins   Review of  Systems Review of Systems  Constitutional:  Negative for activity change, appetite change, fatigue and fever.  Respiratory:  Negative for cough and shortness of breath.   Cardiovascular:  Negative for chest pain.  Gastrointestinal:  Negative for abdominal pain, diarrhea, nausea and vomiting.  Genitourinary:  Positive for dyspareunia, vaginal bleeding, vaginal discharge and vaginal pain. Negative for dysuria, frequency and urgency.  Musculoskeletal:  Negative for arthralgias and myalgias.  Neurological:  Negative for dizziness, light-headedness and  headaches.    Physical Exam Triage Vital Signs ED Triage Vitals  Enc Vitals Group     BP 05/26/21 1613 100/60     Pulse Rate 05/26/21 1613 77     Resp 05/26/21 1613 17     Temp 05/26/21 1613 98.6 F (37 C)     Temp Source 05/26/21 1613 Oral     SpO2 05/26/21 1613 100 %     Weight --      Height --      Head Circumference --      Peak Flow --      Pain Score 05/26/21 1610 0     Pain Loc --      Pain Edu? --      Excl. in GC? --    No data found.  Updated Vital Signs BP 100/60 (BP Location: Right Arm)   Pulse 77   Temp 98.6 F (37 C) (Oral)   Resp 17   LMP 05/16/2021 (Approximate)   SpO2 100%   Visual Acuity Right Eye Distance:   Left Eye Distance:   Bilateral Distance:    Right Eye Near:   Left Eye Near:    Bilateral Near:     Physical Exam Vitals reviewed.  Constitutional:      General: She is awake. She is not in acute distress.    Appearance: Normal appearance. She is normal weight. She is not ill-appearing.     Comments: Very pleasant female appears stated age in no acute distress  HENT:     Head: Normocephalic and atraumatic.  Cardiovascular:     Rate and Rhythm: Normal rate and regular rhythm.     Heart sounds: Normal heart sounds, S1 normal and S2 normal. No murmur heard. Pulmonary:     Effort: Pulmonary effort is normal.     Breath sounds: Normal breath sounds. No wheezing, rhonchi or rales.      Comments: Clear to auscultation bilaterally Abdominal:     General: Bowel sounds are normal.     Palpations: Abdomen is soft.     Tenderness: There is no abdominal tenderness. There is no right CVA tenderness, left CVA tenderness, guarding or rebound.     Comments: Benign abdominal exam.  No CVA tenderness.  Genitourinary:    Labia:        Right: No rash or tenderness.        Left: No rash or tenderness.      Vagina: Bleeding present.     Cervix: Normal.     Uterus: Normal.      Adnexa: Right adnexa normal and left adnexa normal.     Comments: Mild bright red blood noted posterior vaginal vault.  No deformity of vaginal walls noted.  No active bleeding.  No fistula noted. Psychiatric:        Behavior: Behavior is cooperative.    UC Treatments / Results  Labs (all labs ordered are listed, but only abnormal results are displayed) Labs Reviewed  POCT URINALYSIS DIPSTICK, ED / UC - Abnormal; Notable for the following components:      Result Value   Ketones, ur TRACE (*)    All other components within normal limits  POC URINE PREG, ED  CERVICOVAGINAL ANCILLARY ONLY    EKG   Radiology No results found.  Procedures Procedures (including critical care time)  Medications Ordered in UC Medications - No data to display  Initial Impression / Assessment and Plan / UC Course  I have reviewed the  triage vital signs and the nursing notes.  Pertinent labs & imaging results that were available during my care of the patient were reviewed by me and considered in my medical decision making (see chart for details).      Urine pregnancy test negative in office today.  UA showed no evidence of infection.  Vaginal exam was essentially normal with no fistula or obvious bleeding.  Concern for bacterial vaginosis as etiology of symptoms.  Patient was started on metronidazole with instruction not to drink any alcohol taking this medication.  Recommended she use hypoallergenic soaps and  detergents and wear loosefitting cotton underwear.  Recommended she follow-up with OB/GYN.  She was given contact information for OB/GYN and encouraged to call them ASAP.  Discussed alarm symptoms or warrant emergent evaluation.  Strict return precautions given to which patient expressed understanding.  Final Clinical Impressions(s) / UC Diagnoses   Final diagnoses:  Vaginal spotting  Vaginal discharge  Dyspareunia in female     Discharge Instructions      Your exam was normal today.  I do not have an explanation for your bleeding. We will be in touch with your STI swab results soon as we have them.  We are treating you for bacterial vaginosis.  Please take metronidazole twice daily for 7 days.  Do not drink any alcohol while taking this medication for 3 days after completing course as it will cause you to vomit.  Please follow-up with OB/GYN as we discussed.  Return if you have any worsening symptoms.     ED Prescriptions     Medication Sig Dispense Auth. Provider   metroNIDAZOLE (FLAGYL) 500 MG tablet Take 1 tablet (500 mg total) by mouth 2 (two) times daily. 14 tablet Taylee Gunnells, Noberto Retort, PA-C      PDMP not reviewed this encounter.   Jeani Hawking, PA-C 05/26/21 1710

## 2021-05-27 ENCOUNTER — Telehealth (HOSPITAL_COMMUNITY): Payer: Self-pay | Admitting: Emergency Medicine

## 2021-05-27 LAB — CERVICOVAGINAL ANCILLARY ONLY
Bacterial Vaginitis (gardnerella): POSITIVE — AB
Candida Glabrata: NEGATIVE
Candida Vaginitis: POSITIVE — AB
Chlamydia: NEGATIVE
Comment: NEGATIVE
Comment: NEGATIVE
Comment: NEGATIVE
Comment: NEGATIVE
Comment: NEGATIVE
Comment: NORMAL
Neisseria Gonorrhea: NEGATIVE
Trichomonas: NEGATIVE

## 2021-05-27 MED ORDER — FLUCONAZOLE 150 MG PO TABS: 150.0000 mg | ORAL_TABLET | Freq: Once | ORAL | 0 refills | Status: AC

## 2022-02-17 ENCOUNTER — Other Ambulatory Visit: Payer: Self-pay

## 2022-02-17 ENCOUNTER — Encounter (HOSPITAL_COMMUNITY): Payer: Self-pay

## 2022-02-17 ENCOUNTER — Ambulatory Visit (HOSPITAL_COMMUNITY)
Admission: EM | Admit: 2022-02-17 | Discharge: 2022-02-17 | Disposition: A | Discharge status: Home / Self Care | Payer: BLUE CROSS/BLUE SHIELD | Attending: Internal Medicine | Admitting: Internal Medicine

## 2022-02-17 DIAGNOSIS — M79644 Pain in right finger(s): Secondary | ICD-10-CM | POA: Insufficient documentation

## 2022-02-17 DIAGNOSIS — M2550 Pain in unspecified joint: Secondary | ICD-10-CM | POA: Diagnosis not present

## 2022-02-17 DIAGNOSIS — M7989 Other specified soft tissue disorders: Secondary | ICD-10-CM | POA: Diagnosis not present

## 2022-02-17 DIAGNOSIS — Z20822 Contact with and (suspected) exposure to covid-19: Secondary | ICD-10-CM | POA: Insufficient documentation

## 2022-02-17 DIAGNOSIS — M79645 Pain in left finger(s): Secondary | ICD-10-CM | POA: Insufficient documentation

## 2022-02-17 DIAGNOSIS — Z8616 Personal history of COVID-19: Secondary | ICD-10-CM | POA: Insufficient documentation

## 2022-02-17 DIAGNOSIS — M778 Other enthesopathies, not elsewhere classified: Secondary | ICD-10-CM | POA: Diagnosis present

## 2022-02-17 DIAGNOSIS — J069 Acute upper respiratory infection, unspecified: Secondary | ICD-10-CM | POA: Diagnosis present

## 2022-02-17 LAB — BASIC METABOLIC PANEL
Anion gap: 6 (ref 5–15)
BUN: 6 mg/dL (ref 6–20)
CO2: 25 mmol/L (ref 22–32)
Calcium: 9.7 mg/dL (ref 8.9–10.3)
Chloride: 105 mmol/L (ref 98–111)
Creatinine, Ser: 0.5 mg/dL (ref 0.44–1.00)
GFR, Estimated: >60 mL/min (ref >60)
Glucose, Bld: 92 mg/dL (ref 70–99)
Potassium: 4 mmol/L (ref 3.5–5.1)
Sodium: 136 mmol/L (ref 135–145)

## 2022-02-17 LAB — CBC
HCT: 31.2 % — ABNORMAL LOW (ref 36.0–46.0)
Hemoglobin: 8.6 g/dL — ABNORMAL LOW (ref 12.0–15.0)
MCH: 17.2 pg — ABNORMAL LOW (ref 26.0–34.0)
MCHC: 27.6 g/dL — ABNORMAL LOW (ref 30.0–36.0)
MCV: 62.4 fL — ABNORMAL LOW (ref 80.0–100.0)
Platelets: 380 10*3/uL (ref 150–400)
RBC: 5 MIL/uL (ref 3.87–5.11)
RDW: 22 % — ABNORMAL HIGH (ref 11.5–15.5)
WBC: 7.1 10*3/uL (ref 4.0–10.5)
nRBC: 0 % (ref 0.0–0.2)

## 2022-02-17 MED ORDER — PREDNISONE 20 MG PO TABS: 20.0000 mg | ORAL_TABLET | Freq: Every day | ORAL | 0 refills | Status: AC

## 2022-02-17 MED ORDER — BENZONATATE 100 MG PO CAPS: 100.0000 mg | ORAL_CAPSULE | Freq: Three times a day (TID) | ORAL | 0 refills | Status: DC | PRN

## 2022-02-17 NOTE — ED Provider Notes (Signed)
[facility]    CSN: [id number] Arrival date & time: 02/17/22  1457      History   Chief Complaint Chief Complaint  Patient presents with   Nasal Congestion    HPI [name] [name] is a 36 y.o. female comes to urgent care with a 2-day history of nasal congestion, sore throat and sinus congestion.  Patient denies any fever or chills.  She endorses sick contact at home with similar symptoms.  She is not vaccinated against COVID-19 virus.  She started having a cough yesterday.  Cough is not productive of sputum.  No shortness of breath or wheezing.  She contracted COVID-19 infection in 2020.  No chest pain or chest pressure.  No nausea, vomiting or diarrhea.  Patient has right index finger pain with swelling.  Symptoms have been ongoing for the past several days.  Pain is mainly on the dorsal aspect of the left finger.  No erythema.  No trauma to the left finger.  Decreased flexion of the left index finger.   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   Substance abuse (HCC)    Suicide attempt (HCC) 06/19/2012   UTI (lower urinary tract infection) 06/19/2012    Patient Active Problem List   Diagnosis Date Noted   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   Hemorrhoids    ASCUS with positive high risk HPV cervical 11/25/2018   Rubella non-immune status, antepartum 11/25/2018   Trichomonas infection 11/25/2018   Iron deficiency anemia due to chronic blood loss 07/04/2018   Perianal abscess 07/04/2018   Inadequate social support 07/04/2018   History of Cocaine abuse  affecting pregnancy in third trimester (HCC) 04/2016 04/27/2016   Tobacco abuse 06/17/2012    Past Surgical History:  Procedure Laterality Date   DENTAL SURGERY     HEMORRHOID SURGERY N/A 03/25/2020   Procedure: HEMORRHOIDECTOMY;  Surgeon: Karie Soda, MD;  Location: [facility] ;  Service: General;  Laterality: N/A;   INCISE AND DRAIN ABCESS  01/16/2018   INCISION AND DRAINAGE PERIRECTAL ABSCESS N/A 01/16/2018   Procedure: IRRIGATION AND DEBRIDEMENT PERIRECTAL ABSCESS;  Surgeon: Rodman Pickle, MD;  Location: [facility];  Service: General;  Laterality: N/A;   LAPAROSCOPIC TUBAL LIGATION Bilateral 02/05/2019   Procedure: LAPAROSCOPIC TUBAL LIGATION WITH CERVICAL POLYPECTOMY;  Surgeon: Allie Bossier, MD;  Location:  [facility];  Service: Gynecology;  Laterality: Bilateral;   RECTAL EXAM UNDER ANESTHESIA N/A 03/25/2020   Procedure: ANORECTAL EXAM UNDER ANESTHESIA, REPAIR OF PERIRECTAL FISTULA;  Surgeon: Karie Soda, MD;  Location: [facility];  Service: General;  Laterality: N/A;    OB History     Gravida  5   Para  4   Term  3   Preterm  1   AB  1   Living  4      SAB  1   IAB  0   Ectopic  0  Multiple  0   Live Births  4            Home Medications    Prior to Admission medications   Medication Sig Start Date End Date Taking? Authorizing Provider  benzonatate (TESSALON) 100 MG capsule Take 1 capsule (100 mg total) by mouth 3 (three) times daily as needed for cough. 02/17/22  Yes Zonnie Landen, Britta Mccreedy, MD  predniSONE (DELTASONE) 20 MG tablet Take 1 tablet (20 mg total) by mouth daily for 5 days. 02/17/22 02/22/22 Yes Raul Torrance, Britta Mccreedy, MD  cyclobenzaprine (FLEXERIL) 5 MG tablet Take 1 tablet (5 mg total) by mouth 3 (three) times daily as needed for muscle spasms. 10/22/20   Ward, Tylene Fantasia, PA-C  naproxen (NAPROSYN) 500 MG tablet Take 1 tablet (500 mg total) by mouth 2 (two) times daily. 10/22/20   Ward, Tylene Fantasia, PA-C  naproxen  (NAPROSYN) 500 MG tablet Take 1 tablet (500 mg total) by mouth every 12 (twelve) hours as needed for mild pain or moderate pain. 10/22/20   Ward, Tylene Fantasia, PA-C  albuterol (VENTOLIN HFA) 108 (90 Base) MCG/ACT inhaler Inhale 1-2 puffs into the lungs every 6 (six) hours as needed for wheezing or shortness of breath. Patient taking differently: Inhale 1-2 puffs into the lungs every 6 (six) hours as needed for wheezing or shortness of breath. Out of inhaler 07/03/19 12/02/20  Zadie Rhine, MD    Family History Family History  Problem Relation Age of Onset   Hypertension Mother    Diabetes type II Mother    Arthritis Mother    Hyperlipidemia Mother    Cancer Father        lung    Social History Social History   Tobacco Use   Smoking status: Every Day    Packs/day: 0.00    Years: 9.00    Pack years: 0.00    Types: Cigarettes    Last attempt to quit: 01/14/2018    Years since quitting: 4.0   Smokeless tobacco: Never   Tobacco comments:    3 cigarettes per day  Vaping Use   Vaping Use: Every day   Substances: Nicotine, Flavoring  Substance Use Topics   Alcohol use: Not Currently    Comment: rare   Drug use: Not Currently    Frequency: 3.0 times per week    Types: Marijuana, Cocaine    Comment: last time was 2019 for cocaine, none since occ marijuana     Allergies   Penicillins   Review of Systems Review of Systems  Constitutional: Negative.   HENT:  Positive for congestion, postnasal drip and sinus pain. Negative for sore throat and trouble swallowing.   Eyes: Negative.   Respiratory: Negative.    Gastrointestinal: Negative.   Musculoskeletal:  Positive for arthralgias.  Skin: Negative.     Physical Exam Triage Vital Signs ED Triage Vitals  Enc Vitals Group     BP 02/17/22 1534 92/67     Pulse Rate 02/17/22 1534 73     Resp 02/17/22 1534 18     Temp 02/17/22 1534 98 F (36.7 C)     Temp Source 02/17/22 1534 Oral     SpO2 02/17/22 1534 97 %     Weight --       Height --      Head Circumference --      Peak Flow --      Pain Score 02/17/22 1533 7     Pain Loc --  Pain Edu? --      Excl. in [place]? --    No data found.  Updated Vital Signs BP 92/67 (BP Location: Right Arm)    Pulse 73    Temp 98 F (36.7 C) (Oral)    Resp 18    LMP 01/24/2022 (Approximate)    SpO2 97%   Visual Acuity Right Eye Distance:   Left Eye Distance:   Bilateral Distance:    Right Eye Near:   Left Eye Near:    Bilateral Near:     Physical Exam Vitals reviewed.  Constitutional:      Appearance: Normal appearance. She is ill-appearing.  HENT:     Mouth/Throat:     Mouth: Mucous membranes are moist.     Pharynx: Posterior oropharyngeal erythema present.  Cardiovascular:     Rate and Rhythm: Normal rate and regular rhythm.     Pulses: Normal pulses.     Heart sounds: Normal heart sounds.  Pulmonary:     Effort: Pulmonary effort is normal.     Breath sounds: Normal breath sounds.  Musculoskeletal:     Cervical back: Neck supple.     Comments: Right index finger swelling.  Tenderness over the ventral surface of the right index finger.  Tenderness is mainly in the middle and distal phalanx.  Neurological:     Mental Status: She is alert.     UC Treatments / Results  Labs (all labs ordered are listed, but only abnormal results are displayed) Labs Reviewed  SARS CORONAVIRUS 2 (TAT 6-24 HRS)  CBC  BASIC METABOLIC PANEL    EKG   Radiology No results found.  Procedures Procedures (including critical care time)  Medications Ordered in UC Medications - No data to display  Initial Impression / Assessment and Plan / UC Course  I have reviewed the triage vital signs and the nursing notes.  Pertinent labs & imaging results that were available during my care of the patient were reviewed by me and considered in my medical decision making (see chart for details).     1.  Viral URI with cough: Increase oral fluid intake Tessalon Perles as  needed for cough Humidifier and vapor rub to help with nasal congestion COVID-19 PCR test has been sent We will call patient with recommendations if labs are abnormal.  2.  Tendinitis of the right index finger: Gentle range of motion exercises Prednisone 20 mg orally daily for 5 days If symptoms persist, patient may need to see a hand surgeon for further evaluation. Final Clinical Impressions(s) / UC Diagnoses   Final diagnoses:  Viral URI with cough  Tendinitis of finger of right hand     Discharge Instructions      Increase oral fluid intake We will call you with recommendations if labs are abnormal Please take medications as prescribed Return to urgent care if symptoms worsen.   ED Prescriptions     Medication Sig Dispense Auth. Provider   benzonatate (TESSALON) 100 MG capsule Take 1 capsule (100 mg total) by mouth 3 (three) times daily as needed for cough. 21 capsule Xyler Terpening, Britta Mccreedy, MD   predniSONE (DELTASONE) 20 MG tablet Take 1 tablet (20 mg total) by mouth daily for 5 days. 5 tablet Warren Lindahl, Britta Mccreedy, MD      PDMP not reviewed this encounter.   Merrilee Jansky, MD 02/17/22 [phone]

## 2022-02-17 NOTE — ED Triage Notes (Signed)
Pt c/o facial congestion, abd ache. ?Started: 2 days ago ? ?Pt als c/o swelling and pain to right index finger.  ?

## 2022-02-17 NOTE — Discharge Instructions (Addendum)
Increase oral fluid intake ?We will call you with recommendations if labs are abnormal ?Please take medications as prescribed ?Return to urgent care if symptoms worsen. ?

## 2022-02-18 LAB — SARS CORONAVIRUS 2 (TAT 6-24 HRS): SARS Coronavirus 2: NEGATIVE

## 2022-09-16 ENCOUNTER — Ambulatory Visit (HOSPITAL_COMMUNITY)
Admission: EM | Admit: 2022-09-16 | Discharge: 2022-09-16 | Disposition: A | Discharge status: Home / Self Care | Payer: Medicaid Other | Attending: Internal Medicine | Admitting: Internal Medicine

## 2022-09-16 ENCOUNTER — Encounter (HOSPITAL_COMMUNITY): Payer: Self-pay

## 2022-09-16 DIAGNOSIS — R319 Hematuria, unspecified: Secondary | ICD-10-CM | POA: Diagnosis present

## 2022-09-16 DIAGNOSIS — N390 Urinary tract infection, site not specified: Secondary | ICD-10-CM | POA: Diagnosis present

## 2022-09-16 LAB — POCT URINALYSIS DIPSTICK, ED / UC
Bilirubin Urine: NEGATIVE
Glucose, UA: NEGATIVE mg/dL
Ketones, ur: NEGATIVE mg/dL
Nitrite: NEGATIVE
Protein, ur: >=300 mg/dL — AB
Specific Gravity, Urine: >=1.03 (ref 1.005–1.030)
Urobilinogen, UA: 0.2 mg/dL (ref 0.0–1.0)
pH: 6 (ref 5.0–8.0)

## 2022-09-16 MED ORDER — CEPHALEXIN 500 MG PO CAPS: 500.0000 mg | ORAL_CAPSULE | Freq: Four times a day (QID) | ORAL | 0 refills | Status: AC

## 2022-09-16 NOTE — ED Triage Notes (Signed)
Patient states she feels like she may have a UTI. States she always feels like she has a full bladder but when she voids very little comes out and she has a throbbing pain in her urethra.

## 2022-09-16 NOTE — Discharge Instructions (Addendum)
Return if any problems.

## 2022-09-16 NOTE — ED Provider Notes (Signed)
[place]    CSN: [id number] Arrival date & time: 09/16/22  1005      History   Chief Complaint Chief Complaint  Patient presents with   Urinary Tract Infection    HPI [name] [name] is a 36 y.o. female.   Pt reports she has noticed blood in her urine. Pt has had a discharge.  Pt reports she has  ahitory of having a discahrge after her peiod.  Pt request a swab for std testing   The history is provided by the patient. No language interpreter was used.  Urinary Tract Infection Pain quality:  Aching Pain severity:  Moderate Onset quality:  Gradual Duration:  3 days Timing:  Constant Progression:  Worsening Chronicity:  New Relieved by:  Nothing Worsened by:  Nothing Urinary symptoms: no discolored urine   Associated symptoms: no abdominal pain   Risk factors: recurrent urinary tract infections     Past Medical History:  Diagnosis Date   Abscess of axilla    Anal fissure 2014   Anemia    Anxiety    Bipolar 1 disorder (HCC)    Chronic bronchitis ([place])    COVID-19 02/28/2020   LOSS OF TASTE AND SMELL, NOW RESOLVED AFTER QUARANTINE   Depression    bipolar; not on meds   Headache(784.0)    migraine   Heart murmur    as a child none now   Hemorrhoids    Hypokalemia 06/17/2012   Perianal abscess 12/2017   Severe major depression ([place]) 06/17/2012   Substance abuse ([place])    Suicide attempt ([place]) 06/19/2012   UTI (lower urinary tract infection) 06/19/2012    Patient Active Problem List   Diagnosis Date Noted   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   Hemorrhoids    ASCUS with positive high risk HPV cervical 11/25/2018   Rubella non-immune status, antepartum 11/25/2018   Trichomonas infection 11/25/2018   Iron deficiency anemia due to chronic blood loss 07/04/2018   Perianal abscess 07/04/2018   Inadequate social support 07/04/2018   History of Cocaine abuse affecting pregnancy in third trimester ([place]) 04/2016 04/27/2016   Tobacco abuse 06/17/2012    Past  Surgical History:  Procedure Laterality Date   DENTAL SURGERY     HEMORRHOID SURGERY N/A 03/25/2020   Procedure: HEMORRHOIDECTOMY;  Surgeon: Michael Boston, MD;  Location: [place];  Service: General;  Laterality: N/A;   INCISE AND DRAIN ABCESS  01/16/2018   INCISION AND DRAINAGE PERIRECTAL ABSCESS N/A 01/16/2018   Procedure: IRRIGATION AND DEBRIDEMENT PERIRECTAL ABSCESS;  Surgeon: Mickeal Skinner, MD;  Location: [place];  Service: General;  Laterality: N/A;   LAPAROSCOPIC TUBAL LIGATION Bilateral 02/05/2019   Procedure: LAPAROSCOPIC TUBAL LIGATION WITH CERVICAL POLYPECTOMY;  Surgeon: Emily Filbert, MD;  Location: [place];  Service: Gynecology;  Laterality: Bilateral;   RECTAL EXAM UNDER ANESTHESIA N/A 03/25/2020   Procedure: ANORECTAL EXAM UNDER ANESTHESIA, REPAIR OF PERIRECTAL FISTULA;  Surgeon: Michael Boston, MD;  Location: [place];  Service: General;  Laterality: N/A;    OB History     Gravida  5   Para  4   Term  3   Preterm  1   AB  1   Living  4      SAB  1   IAB  0   Ectopic  0   Multiple  0   Live Births  4            Home  Medications    Prior to Admission medications   Medication Sig Start Date End Date Taking? Authorizing Provider  albuterol (VENTOLIN HFA) 108 (90 Base) MCG/ACT inhaler Inhale 1-2 puffs into the lungs every 6 (six) hours as needed for wheezing or shortness of breath. Patient taking differently: Inhale 1-2 puffs into the lungs every 6 (six) hours as needed for wheezing or shortness of breath. Out of inhaler 07/03/19 12/02/20  Ripley Fraise, MD    Family History Family History  Problem Relation Age of Onset   Hypertension Mother    Diabetes type II Mother    Arthritis Mother    Hyperlipidemia Mother    Cancer Father        lung    Social History Social History   Tobacco Use   Smoking status: Every Day    Packs/day: 0.00    Years: 9.00    Total pack years: 0.00     Types: Cigarettes    Last attempt to quit: 01/14/2018    Years since quitting: 4.6   Smokeless tobacco: Never   Tobacco comments:    3 cigarettes per day  Vaping Use   Vaping Use: Every day   Substances: Nicotine, Flavoring  Substance Use Topics   Alcohol use: Not Currently    Comment: rare   Drug use: Not Currently    Frequency: 3.0 times per week    Types: Marijuana, Cocaine    Comment: last time was 2019 for cocaine, none since occ marijuana     Allergies   Penicillins   Review of Systems Review of Systems  Gastrointestinal:  Negative for abdominal pain.  All other systems reviewed and are negative.    Physical Exam Triage Vital Signs ED Triage Vitals  Enc Vitals Group     BP 09/16/22 1037 (!) 99/59     Pulse Rate 09/16/22 1037 75     Resp 09/16/22 1037 16     Temp 09/16/22 1037 98 F (36.7 C)     Temp Source 09/16/22 1037 Oral     SpO2 09/16/22 1037 99 %     Weight --      Height --      Head Circumference --      Peak Flow --      Pain Score 09/16/22 1035 8     Pain Loc --      Pain Edu? --      Excl. in [place]? --    No data found.  Updated Vital Signs BP (!) 99/59 (BP Location: Left Arm)   Pulse 75   Temp 98 F (36.7 C) (Oral)   Resp 16   LMP 09/08/2022 (Exact Date)   SpO2 99%   Visual Acuity Right Eye Distance:   Left Eye Distance:   Bilateral Distance:    Right Eye Near:   Left Eye Near:    Bilateral Near:     Physical Exam Vitals reviewed.  Constitutional:      Appearance: Normal appearance.  HENT:     Mouth/Throat:     Mouth: Mucous membranes are moist.  Cardiovascular:     Rate and Rhythm: Normal rate.  Pulmonary:     Effort: Pulmonary effort is normal.  Musculoskeletal:        General: Normal range of motion.  Skin:    General: Skin is warm.  Neurological:     General: No focal deficit present.     Mental Status: She is alert.  Psychiatric:  Mood and Affect: Mood normal.      UC Treatments / Results   Labs (all labs ordered are listed, but only abnormal results are displayed) Labs Reviewed  POCT URINALYSIS DIPSTICK, ED / UC - Abnormal; Notable for the following components:      Result Value   Hgb urine dipstick LARGE (*)    Protein, ur >=300 (*)    Leukocytes,Ua SMALL (*)    All other components within normal limits  CERVICOVAGINAL ANCILLARY ONLY    EKG   Radiology No results found.  Procedures Procedures (including critical care time)  Medications Ordered in UC Medications - No data to display  Initial Impression / Assessment and Plan / UC Course  I have reviewed the triage vital signs and the nursing notes.  Pertinent labs & imaging results that were available during my care of the patient were reviewed by me and considered in my medical decision making (see chart for details).     MDM:  Ua shows protein blood and leukocytes.  I will treat pt for UTi.  Vaginal swab for std's pending  Final Clinical Impressions(s) / UC Diagnoses   Final diagnoses:  Urinary tract infection with hematuria, site unspecified   Discharge Instructions   None    ED Prescriptions     Medication Sig Dispense Auth. Provider   cephALEXin (KEFLEX) 500 MG capsule Take 1 capsule (500 mg total) by mouth 4 (four) times daily for 7 days. 28 capsule Fransico Meadow, [place]      PDMP not reviewed this encounter. An After Visit Summary was printed and given to the patient.    Fransico Meadow, [place] 09/16/22 1139

## 2022-09-18 ENCOUNTER — Telehealth (HOSPITAL_COMMUNITY): Payer: Self-pay | Admitting: Emergency Medicine

## 2022-09-18 LAB — CERVICOVAGINAL ANCILLARY ONLY
Bacterial Vaginitis (gardnerella): POSITIVE — AB
Candida Glabrata: NEGATIVE
Candida Vaginitis: POSITIVE — AB
Chlamydia: NEGATIVE
Comment: NEGATIVE
Comment: NEGATIVE
Comment: NEGATIVE
Comment: NEGATIVE
Comment: NEGATIVE
Comment: NORMAL
Neisseria Gonorrhea: NEGATIVE
Trichomonas: POSITIVE — AB

## 2022-09-18 MED ORDER — FLUCONAZOLE 150 MG PO TABS: 150.0000 mg | ORAL_TABLET | Freq: Once | ORAL | 0 refills | Status: AC

## 2022-09-18 MED ORDER — METRONIDAZOLE 500 MG PO TABS: 500.0000 mg | ORAL_TABLET | Freq: Two times a day (BID) | ORAL | 0 refills | Status: DC

## 2022-10-25 ENCOUNTER — Encounter (HOSPITAL_COMMUNITY): Payer: Self-pay

## 2022-10-25 ENCOUNTER — Ambulatory Visit (HOSPITAL_COMMUNITY)
Admission: EM | Admit: 2022-10-25 | Discharge: 2022-10-25 | Disposition: A | Discharge status: Home / Self Care | Payer: Medicaid Other | Attending: Physician Assistant | Admitting: Physician Assistant

## 2022-10-25 ENCOUNTER — Ambulatory Visit (INDEPENDENT_AMBULATORY_CARE_PROVIDER_SITE_OTHER): Payer: Medicaid Other

## 2022-10-25 DIAGNOSIS — L03116 Cellulitis of left lower limb: Secondary | ICD-10-CM | POA: Diagnosis not present

## 2022-10-25 DIAGNOSIS — S63622A Sprain of interphalangeal joint of left thumb, initial encounter: Secondary | ICD-10-CM

## 2022-10-25 DIAGNOSIS — S60362A Insect bite (nonvenomous) of left thumb, initial encounter: Secondary | ICD-10-CM | POA: Diagnosis not present

## 2022-10-25 MED ORDER — NAPROXEN 500 MG PO TABS: 500.0000 mg | ORAL_TABLET | Freq: Two times a day (BID) | ORAL | 0 refills | Status: DC

## 2022-10-25 MED ORDER — SULFAMETHOXAZOLE-TRIMETHOPRIM 800-160 MG PO TABS: 1.0000 | ORAL_TABLET | Freq: Two times a day (BID) | ORAL | 0 refills | Status: DC

## 2022-10-25 NOTE — ED Provider Notes (Signed)
[place]    CSN: [id number] Arrival date & time: 10/25/22  0818      History   Chief Complaint Chief Complaint  Patient presents with   Insect Bite   Hand Injury    HPI [name] [name] is a 36 y.o. female.   Patient presents today with several concerns.  She reports that on 10/23/2022 she felt a stinging sensation in her left medial lower leg.  She did not witness any insect but then developed redness and increasing tenderness over the past several days.  She has been able to express a small amount of purulent drainage but reports that pain has been worsening prompting evaluation.  Pain is rated 9 on a 0-10 pain scale, described as throbbing/aching, worse with palpation, no alleviating factors identified.  She denies history of recurrent skin infections or MRSA.  Denies any recent antibiotic use.  She is confident that she is not pregnant.  She has not tried any over-the-counter medication for symptom management.  Denies any systemic symptoms including nausea, vomiting, fever, weakness.  In addition, patient reports that on the same day (10/23/2022) she was playing pool when she injured her left thumb.  Reports hyperextension with a feeling of a pop and has had ongoing pain at the PIP joint since that time.  She is right-handed.  Denies any numbness or paresthesias.  She has not tried any over-the-counter medication for symptom management.  Reports pain is rated 9 on a 0-10 pain scale, localized to the joint with radiation into hand, described as throbbing, worse with movement, no alleviating factors identified.    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   Substance abuse ([place])    Suicide attempt ([place]) 06/19/2012   UTI (lower urinary tract infection) 06/19/2012    Patient Active Problem List   Diagnosis Date Noted   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   Hemorrhoids    ASCUS with positive high risk HPV cervical 11/25/2018   Rubella non-immune status, antepartum 11/25/2018   Trichomonas infection 11/25/2018   Iron deficiency anemia due to chronic blood loss 07/04/2018   Perianal abscess 07/04/2018   Inadequate social support 07/04/2018   History of Cocaine abuse affecting pregnancy in third trimester ([place]) 04/2016 04/27/2016   Tobacco abuse 06/17/2012    Past Surgical History:  Procedure Laterality Date   DENTAL SURGERY     HEMORRHOID SURGERY N/A 03/25/2020   Procedure: HEMORRHOIDECTOMY;  Surgeon: Michael Boston, MD;  Location: [facility];  Service: General;  Laterality: N/A;   INCISE AND DRAIN ABCESS  01/16/2018   INCISION AND DRAINAGE PERIRECTAL ABSCESS N/A 01/16/2018   Procedure: IRRIGATION AND DEBRIDEMENT PERIRECTAL ABSCESS;  Surgeon: Mickeal Skinner, MD;  Location: [facility];  Service: General;  Laterality: N/A;   LAPAROSCOPIC TUBAL LIGATION Bilateral 02/05/2019   Procedure: LAPAROSCOPIC TUBAL LIGATION WITH CERVICAL POLYPECTOMY;  Surgeon: Emily Filbert, MD;  Location: [facility];  Service: Gynecology;  Laterality: Bilateral;   RECTAL EXAM UNDER ANESTHESIA N/A 03/25/2020   Procedure: ANORECTAL EXAM UNDER ANESTHESIA, REPAIR OF PERIRECTAL FISTULA;  Surgeon: Michael Boston, MD;  Location: [facility];  Service: General;  Laterality: N/A;    OB History     Gravida  5   Para  4   Term  3   Preterm  1   AB  1   Living  4      SAB  1   IAB  0   Ectopic  0   Multiple  0   Live Births  4            Home Medications    Prior to Admission medications   Medication Sig Start Date End Date  Taking? Authorizing Provider  naproxen (NAPROSYN) 500 MG tablet Take 1 tablet (500 mg total) by mouth 2 (two) times daily. 10/25/22  Yes Adhrit Krenz K, PA-C  sulfamethoxazole-trimethoprim (BACTRIM DS) 800-160 MG tablet Take 1 tablet by mouth 2 (two) times daily for 7 days. 10/25/22 11/01/22 Yes Marilu Rylander K, PA-C  albuterol (VENTOLIN HFA) 108 (90 Base) MCG/ACT inhaler Inhale 1-2 puffs into the lungs every 6 (six) hours as needed for wheezing or shortness of breath. Patient taking differently: Inhale 1-2 puffs into the lungs every 6 (six) hours as needed for wheezing or shortness of breath. Out of inhaler 07/03/19 12/02/20  Ripley Fraise, MD    Family History Family History  Problem Relation Age of Onset   Hypertension Mother    Diabetes type II Mother    Arthritis Mother    Hyperlipidemia Mother    Cancer Father        lung    Social History Social History   Tobacco Use   Smoking status: Every Day    Packs/day: 0.00    Years: 9.00    Total pack years: 0.00    Types: Cigarettes    Last attempt to quit: 01/14/2018    Years since quitting: 4.7   Smokeless tobacco: Never   Tobacco comments:    3 cigarettes per day  Vaping Use   Vaping Use: Every day   Substances: Nicotine, Flavoring  Substance Use Topics   Alcohol use: Not Currently    Comment: rare   Drug use: Not Currently    Frequency: 3.0 times per week    Types: Marijuana, Cocaine    Comment: last time was 2019 for cocaine, none since occ marijuana     Allergies   Penicillins   Review of Systems Review of Systems  Constitutional:  Positive for activity change. Negative for appetite change, fatigue and fever.  Respiratory:  Negative for cough and shortness of breath.   Cardiovascular:  Negative for chest pain.  Gastrointestinal:  Negative for abdominal pain, diarrhea, nausea and vomiting.  Musculoskeletal:  Positive for arthralgias, gait problem and joint swelling. Negative for myalgias.  Skin:  Positive for  color change and wound.  Neurological:  Negative for dizziness, light-headedness and headaches.     Physical Exam Triage Vital Signs ED Triage Vitals  Enc Vitals Group     BP 10/25/22 0901 117/80     Pulse Rate 10/25/22 0901 76     Resp 10/25/22 0901 18     Temp 10/25/22 0901 97.9 F (36.6 C)     Temp Source 10/25/22 0901 Oral     SpO2 10/25/22 0901 99 %     Weight --      Height --  Head Circumference --      Peak Flow --      Pain Score 10/25/22 0902 9     Pain Loc --      Pain Edu? --      Excl. in [place]? --    No data found.  Updated Vital Signs BP 117/80 (BP Location: Left Arm)   Pulse 76   Temp 97.9 F (36.6 C) (Oral)   Resp 18   LMP 10/12/2022   SpO2 99%   Visual Acuity Right Eye Distance:   Left Eye Distance:   Bilateral Distance:    Right Eye Near:   Left Eye Near:    Bilateral Near:     Physical Exam Vitals reviewed.  Constitutional:      General: She is awake. She is not in acute distress.    Appearance: Normal appearance. She is well-developed. She is not ill-appearing.     Comments: Very pleasant female appears stated age in no acute distress sitting comfortably in exam room  HENT:     Head: Normocephalic and atraumatic.  Cardiovascular:     Rate and Rhythm: Normal rate and regular rhythm.     Pulses:          Posterior tibial pulses are 1+ on the left side.     Heart sounds: Normal heart sounds, S1 normal and S2 normal. No murmur heard.    Comments: Capillary refill within 2 seconds left thumb. Pulmonary:     Effort: Pulmonary effort is normal.     Breath sounds: Normal breath sounds. No wheezing, rhonchi or rales.     Comments: Clear to auscultation bilaterally Musculoskeletal:     Left hand: Swelling, tenderness and bony tenderness present. No deformity. Decreased range of motion. Normal strength. There is no disruption of two-point discrimination. Normal capillary refill.     Comments: Left thumb: Tenderness palpation and swelling  noted at PIP.  Decreased range of motion with abduction of thumb.  Thumb neurovascularly intact.  Skin:    Findings: Erythema and wound present.          Comments: 1 cm indurated nodule with surrounding erythema measuring approximately 6 cm x 5 cm noted left medial lower leg.  No fluctuance, bleeding, drainage noted.  No streaking or evidence of lymphangitis.  Area is warm to touch and tender to palpation.  Psychiatric:        Behavior: Behavior is cooperative.      UC Treatments / Results  Labs (all labs ordered are listed, but only abnormal results are displayed) Labs Reviewed - No data to display  EKG   Radiology DG Finger Thumb Left  Result Date: 10/25/2022 CLINICAL DATA:  Recent insect bite and injury to the left thumb with pain and decreased range of motion, initial encounter EXAM: LEFT THUMB 2+V COMPARISON:  None Available. FINDINGS: There is no evidence of fracture or dislocation. There is no evidence of arthropathy or other focal bone abnormality. Soft tissues are unremarkable. IMPRESSION: No acute abnormality noted.  No radiopaque foreign body is seen. Electronically Signed   By: Inez Catalina M.D.   On: 10/25/2022 10:16    Procedures Procedures (including critical care time)  Medications Ordered in UC Medications - No data to display  Initial Impression / Assessment and Plan / UC Course  I have reviewed the triage vital signs and the nursing notes.  Pertinent labs & imaging results that were available during my care of the patient were reviewed by me and  considered in my medical decision making (see chart for details).     Suspect insect bite/skin injury has led to cellulitis given clinical presentation.  Patient was started on Bactrim DS twice daily with instruction to stop medication and be seen immediately with any rash or oral lesion.  Recommended elevation and using Naprosyn for pain relief.  Discussed that if her symptoms worsen anyway and she has increased pain,  swelling, fever, nausea, vomiting she needs to be seen immediately.  Strict return precautions given.  Work excuse note provided.  X-ray of thumb was obtained given popping sensation with significant decreased range of motion.  No osseous abnormality was noted.  Suspect sprain as etiology of symptoms.  Patient was placed in a brace for comfort and support.  She was started on Naprosyn with instruction not to take NSAIDs with this medication due to risk of GI bleeding.  Use acetaminophen/Tylenol for symptom relief.  Discussed that if her symptoms are not improving she is to return for reevaluation.  Strict return precautions given.  Work excuse note provided.  Final Clinical Impressions(s) / UC Diagnoses   Final diagnoses:  Sprain of interphalangeal joint of left thumb, initial encounter  Cellulitis of left lower extremity     Discharge Instructions      Take Naprosyn for pain and swelling.  Do not take NSAIDs with this medication including aspirin, ibuprofen/Advil, naproxen/Aleve.  Start sulfamethoxazole-trimethoprim twice daily.  Keep your leg elevated.  If your symptoms are changing in any way you have increased pain, spread of redness, fever, nausea, vomiting you need to be seen immediately.  Naprosyn should help with pain.  Use the brace for comfort and support.  Avoid strenuous activity involving your left thumb.  If anything changes or worsens please return for reevaluation.     ED Prescriptions     Medication Sig Dispense Auth. Provider   naproxen (NAPROSYN) 500 MG tablet Take 1 tablet (500 mg total) by mouth 2 (two) times daily. 30 tablet Dutchess Crosland K, PA-C   sulfamethoxazole-trimethoprim (BACTRIM DS) 800-160 MG tablet Take 1 tablet by mouth 2 (two) times daily for 7 days. 14 tablet Eura Radabaugh, Noberto Retort, PA-C      PDMP not reviewed this encounter.   Jeani Hawking, PA-C 10/25/22 1034

## 2022-10-25 NOTE — ED Triage Notes (Signed)
Pt c/o insect bite to LLE on Monday and now its painful, red, and swollen. States Monday night she was playing pool and hit her lt thumb and bent it backwards.

## 2022-10-25 NOTE — Discharge Instructions (Signed)
Take Naprosyn for pain and swelling.  Do not take NSAIDs with this medication including aspirin, ibuprofen/Advil, naproxen/Aleve.  Start sulfamethoxazole-trimethoprim twice daily.  Keep your leg elevated.  If your symptoms are changing in any way you have increased pain, spread of redness, fever, nausea, vomiting you need to be seen immediately.  Naprosyn should help with pain.  Use the brace for comfort and support.  Avoid strenuous activity involving your left thumb.  If anything changes or worsens please return for reevaluation.

## 2022-10-30 ENCOUNTER — Encounter (HOSPITAL_COMMUNITY): Payer: Self-pay

## 2022-10-30 ENCOUNTER — Ambulatory Visit (HOSPITAL_COMMUNITY)
Admission: EM | Admit: 2022-10-30 | Discharge: 2022-10-30 | Disposition: A | Discharge status: Home / Self Care | Payer: Medicaid Other | Attending: Emergency Medicine | Admitting: Emergency Medicine

## 2022-10-30 DIAGNOSIS — L02416 Cutaneous abscess of left lower limb: Secondary | ICD-10-CM | POA: Diagnosis not present

## 2022-10-30 MED ORDER — DOXYCYCLINE HYCLATE 100 MG PO CAPS: 100.0000 mg | ORAL_CAPSULE | Freq: Two times a day (BID) | ORAL | 0 refills | Status: AC

## 2022-10-30 NOTE — Discharge Instructions (Addendum)
Please take medication as prescribed. Take with food to avoid upset stomach.  It may continue to drain. Keep covered to catch drainage  Please go to the emergency department if symptoms worsen.

## 2022-10-30 NOTE — ED Provider Notes (Signed)
[facility]    CSN: [id number] Arrival date & time: 10/30/22  1540      History   Chief Complaint Chief Complaint  Patient presents with   Follow-up    HPI [name] [name] is a 36 y.o. female.  Presents with continued pain and drainage from area on left lower leg Seen 11/8 after a possible bite, treated for cellulitis with Bactrim twice a day for 7 days. 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   Substance abuse ([place])    Suicide attempt ([place]) 06/19/2012   UTI (lower urinary tract infection) 06/19/2012    Patient Active Problem List   Diagnosis Date Noted   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   Hemorrhoids    ASCUS with positive high risk HPV cervical 11/25/2018   Rubella non-immune status, antepartum 11/25/2018   Trichomonas infection 11/25/2018   Iron deficiency anemia due to chronic blood loss 07/04/2018   Perianal abscess 07/04/2018   Inadequate social support 07/04/2018   History of Cocaine abuse affecting pregnancy in third trimester ([place]) 04/2016 04/27/2016   Tobacco abuse 06/17/2012    Past Surgical History:  Procedure Laterality Date   DENTAL SURGERY     HEMORRHOID SURGERY N/A 03/25/2020   Procedure: HEMORRHOIDECTOMY;  Surgeon: Michael Boston, MD;  Location: [facility];  Service: General;  Laterality: N/A;   INCISE AND DRAIN ABCESS  01/16/2018    INCISION AND DRAINAGE PERIRECTAL ABSCESS N/A 01/16/2018   Procedure: IRRIGATION AND DEBRIDEMENT PERIRECTAL ABSCESS;  Surgeon: Mickeal Skinner, MD;  Location: [facility];  Service: General;  Laterality: N/A;   LAPAROSCOPIC TUBAL LIGATION Bilateral 02/05/2019   Procedure: LAPAROSCOPIC TUBAL LIGATION WITH CERVICAL POLYPECTOMY;  Surgeon: Emily Filbert, MD;  Location: [facility];  Service: Gynecology;  Laterality: Bilateral;   RECTAL EXAM UNDER ANESTHESIA N/A 03/25/2020   Procedure: ANORECTAL EXAM UNDER ANESTHESIA, REPAIR OF PERIRECTAL FISTULA;  Surgeon: Michael Boston, MD;  Location: [facility];  Service: General;  Laterality: N/A;    OB History     Gravida  5   Para  4   Term  3   Preterm  1   AB  1   Living  4      SAB  1   IAB  0   Ectopic  0   Multiple  0   Live Births  4            Home Medications    Prior to Admission medications   Medication Sig Start Date End Date Taking? Authorizing Provider  doxycycline (VIBRAMYCIN) 100 MG capsule Take 1 capsule (100 mg total) by mouth 2 (two) times daily for 5 days. 10/30/22 11/04/22 Yes Dalissa Lovin, Wells Guiles, PA-C  naproxen (NAPROSYN) 500 MG tablet  Take 1 tablet (500 mg total) by mouth 2 (two) times daily. 10/25/22   Raspet, Derry Skill, PA-C  albuterol (VENTOLIN HFA) 108 (90 Base) MCG/ACT inhaler Inhale 1-2 puffs into the lungs every 6 (six) hours as needed for wheezing or shortness of breath. Patient taking differently: Inhale 1-2 puffs into the lungs every 6 (six) hours as needed for wheezing or shortness of breath. Out of inhaler 07/03/19 12/02/20  Ripley Fraise, MD    Family History Family History  Problem Relation Age of Onset   Hypertension Mother    Diabetes type II Mother    Arthritis Mother    Hyperlipidemia Mother    Cancer Father        lung    Social History Social History   Tobacco Use   Smoking status: Every Day    Packs/day: 0.00    Years: 9.00    Total pack years: 0.00     Types: Cigarettes    Last attempt to quit: 01/14/2018    Years since quitting: 4.7   Smokeless tobacco: Never   Tobacco comments:    3 cigarettes per day  Vaping Use   Vaping Use: Every day   Substances: Nicotine, Flavoring  Substance Use Topics   Alcohol use: Not Currently    Comment: rare   Drug use: Not Currently    Frequency: 3.0 times per week    Types: Marijuana, Cocaine    Comment: last time was 2019 for cocaine, none since occ marijuana     Allergies   Penicillins   Review of Systems Review of Systems Per HPI  Physical Exam Triage Vital Signs ED Triage Vitals [10/30/22 1711]  Enc Vitals Group     BP 139/83     Pulse Rate 72     Resp 12     Temp 98.6 F (37 C)     Temp Source Oral     SpO2 97 %     Weight      Height      Head Circumference      Peak Flow      Pain Score      Pain Loc      Pain Edu?      Excl. in [place]?    No data found.  Updated Vital Signs BP 139/83 (BP Location: Left Arm)   Pulse 72   Temp 98.6 F (37 C) (Oral)   Resp 12   LMP 10/12/2022   SpO2 97%     Physical Exam Vitals and nursing note reviewed.  Constitutional:      General: She is not in acute distress. HENT:     Mouth/Throat:     Mouth: Mucous membranes are moist.     Pharynx: Oropharynx is clear.  Cardiovascular:     Rate and Rhythm: Normal rate and regular rhythm.     Pulses: Normal pulses.     Heart sounds: Normal heart sounds.  Pulmonary:     Effort: Pulmonary effort is normal.     Breath sounds: Normal breath sounds.  Musculoskeletal:     Left lower leg: No edema.  Skin:    Findings: Abscess present.          Comments: Draining abscess on the left lower leg, very tender and exam is limited by patient discomfort.  Small amount of erythema surrounding with a little warmth.  Drainage is bloody with some purulence  Neurological:     Mental Status: She is alert and oriented to  person, place, and time.     UC Treatments / Results  Labs (all labs  ordered are listed, but only abnormal results are displayed) Labs Reviewed - No data to display  EKG   Radiology No results found.  Procedures Procedures (including critical care time)  Medications Ordered in UC Medications - No data to display  Initial Impression / Assessment and Plan / UC Course  I have reviewed the triage vital signs and the nursing notes.  Pertinent labs & imaging results that were available during my care of the patient were reviewed by me and considered in my medical decision making (see chart for details).  Without much improvement after 5 days of Bactrim, will switch to Doxy twice daily for 5 days Discussed wound care and monitoring for worsening symptoms.  Patient is instructed to go to the emergency department if symptoms do not improve or worsen after this second course of antibiotics Discussed ice or warm compress with tylenol/naproxen for pain relief  Return precautions discussed. Patient agrees to plan  Final Clinical Impressions(s) / UC Diagnoses   Final diagnoses:  Abscess of left lower leg     Discharge Instructions      Please take medication as prescribed. Take with food to avoid upset stomach.  It may continue to drain. Keep covered to catch drainage  Please go to the emergency department if symptoms worsen.     ED Prescriptions     Medication Sig Dispense Auth. Provider   doxycycline (VIBRAMYCIN) 100 MG capsule Take 1 capsule (100 mg total) by mouth 2 (two) times daily for 5 days. 10 capsule Anedra Penafiel, Lurena Joiner, PA-C      PDMP not reviewed this encounter.   Kathrine Haddock 10/30/22 6301

## 2022-10-30 NOTE — ED Triage Notes (Signed)
Pt is here for a follow up spider bite that is not getting any better

## 2023-04-05 ENCOUNTER — Emergency Department (HOSPITAL_COMMUNITY)
Admission: EM | Admit: 2023-04-05 | Discharge: 2023-04-06 | Disposition: A | Discharge status: Home / Self Care | Payer: Medicaid Other | Attending: Emergency Medicine | Admitting: Emergency Medicine

## 2023-04-05 ENCOUNTER — Encounter (HOSPITAL_COMMUNITY): Payer: Self-pay | Admitting: Emergency Medicine

## 2023-04-05 ENCOUNTER — Emergency Department (HOSPITAL_COMMUNITY): Payer: Medicaid Other

## 2023-04-05 DIAGNOSIS — S92355A Nondisplaced fracture of fifth metatarsal bone, left foot, initial encounter for closed fracture: Secondary | ICD-10-CM | POA: Diagnosis not present

## 2023-04-05 DIAGNOSIS — M7989 Other specified soft tissue disorders: Secondary | ICD-10-CM | POA: Diagnosis not present

## 2023-04-05 DIAGNOSIS — S70312A Abrasion, left thigh, initial encounter: Secondary | ICD-10-CM | POA: Diagnosis not present

## 2023-04-05 DIAGNOSIS — W19XXXA Unspecified fall, initial encounter: Secondary | ICD-10-CM

## 2023-04-05 DIAGNOSIS — Y92410 Unspecified street and highway as the place of occurrence of the external cause: Secondary | ICD-10-CM | POA: Insufficient documentation

## 2023-04-05 DIAGNOSIS — S8265XA Nondisplaced fracture of lateral malleolus of left fibula, initial encounter for closed fracture: Secondary | ICD-10-CM | POA: Diagnosis not present

## 2023-04-05 DIAGNOSIS — S99922A Unspecified injury of left foot, initial encounter: Secondary | ICD-10-CM | POA: Diagnosis present

## 2023-04-05 MED ORDER — OXYCODONE-ACETAMINOPHEN 5-325 MG PO TABS
1.0000 | ORAL_TABLET | Freq: Once | ORAL | Status: AC
  Administered 2023-04-05: 1 via ORAL
  Filled 2023-04-05: qty 1
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
## 2023-04-05 NOTE — ED Notes (Signed)
Pt will call for a ride

## 2023-04-06 MED ORDER — METHOCARBAMOL 500 MG PO TABS: 500.0000 mg | ORAL_TABLET | Freq: Two times a day (BID) | ORAL | 0 refills | Status: DC

## 2023-04-06 NOTE — Discharge Instructions (Addendum)
You have a fracture of your foot--the fifth metatarsal--and of your outside of the ankle (fibula).   Please use Tylenol or ibuprofen for pain.  You may use 600 mg ibuprofen every 6 hours or 1000 mg of Tylenol every 6 hours.  You may choose to alternate between the 2.  This would be most effective.  Not to exceed 4 g of Tylenol within 24 hours.  Not to exceed 3200 mg ibuprofen 24 hours.   Robaxin is a muscle relaxer -- take this as prescribed for pain.   You will need to follow-up with orthopedist.  I do need the examination for an office

## 2023-04-06 NOTE — Progress Notes (Signed)
Orthopedic Tech Progress Note Patient Details:  [name] [name] 1986-08-16 [id number]  Ortho Devices Type of Ortho Device: Crutches, CAM walker Ortho Device/Splint Location: lle Ortho Device/Splint Interventions: Ordered, Application, Adjustment   Post Interventions Patient Tolerated: Well Instructions Provided: Care of device, Adjustment of device  Trinna Post 04/06/2023, 2:16 AM

## 2023-04-06 NOTE — ED Notes (Signed)
Ortho tech secure messaged about cam boot and crutches
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
## 2023-09-04 ENCOUNTER — Encounter (HOSPITAL_COMMUNITY): Payer: Self-pay | Admitting: Emergency Medicine

## 2023-09-04 ENCOUNTER — Encounter: Payer: Self-pay | Admitting: Obstetrics and Gynecology

## 2023-09-04 ENCOUNTER — Ambulatory Visit (INDEPENDENT_AMBULATORY_CARE_PROVIDER_SITE_OTHER): Payer: Medicaid Other | Admitting: Obstetrics and Gynecology

## 2023-09-04 ENCOUNTER — Emergency Department (HOSPITAL_COMMUNITY)
Admission: EM | Admit: 2023-09-04 | Discharge: 2023-09-05 | Disposition: A | Discharge status: Home / Self Care | Payer: Medicaid Other | Attending: Emergency Medicine | Admitting: Emergency Medicine

## 2023-09-04 VITALS — BP 116/74 | HR 68 | Ht 66.75 in | Wt 184.0 lb

## 2023-09-04 DIAGNOSIS — R102 Pelvic and perineal pain: Secondary | ICD-10-CM

## 2023-09-04 DIAGNOSIS — N921 Excessive and frequent menstruation with irregular cycle: Secondary | ICD-10-CM

## 2023-09-04 DIAGNOSIS — D500 Iron deficiency anemia secondary to blood loss (chronic): Secondary | ICD-10-CM | POA: Diagnosis not present

## 2023-09-04 DIAGNOSIS — N938 Other specified abnormal uterine and vaginal bleeding: Secondary | ICD-10-CM

## 2023-09-04 DIAGNOSIS — D649 Anemia, unspecified: Secondary | ICD-10-CM | POA: Diagnosis present

## 2023-09-04 LAB — COMPREHENSIVE METABOLIC PANEL
ALT: 15 U/L (ref 0–44)
AST: 13 U/L — ABNORMAL LOW (ref 15–41)
Albumin: 3.4 g/dL — ABNORMAL LOW (ref 3.5–5.0)
Alkaline Phosphatase: 49 U/L (ref 38–126)
Anion gap: 6 (ref 5–15)
BUN: 6 mg/dL (ref 6–20)
CO2: 20 mmol/L — ABNORMAL LOW (ref 22–32)
Calcium: 9.2 mg/dL (ref 8.9–10.3)
Chloride: 108 mmol/L (ref 98–111)
Creatinine, Ser: 0.64 mg/dL (ref 0.44–1.00)
GFR, Estimated: >60 mL/min (ref >60)
Glucose, Bld: 90 mg/dL (ref 70–99)
Potassium: 3.5 mmol/L (ref 3.5–5.1)
Sodium: 134 mmol/L — ABNORMAL LOW (ref 135–145)
Total Bilirubin: 0.4 mg/dL (ref 0.3–1.2)
Total Protein: 6.8 g/dL (ref 6.5–8.1)

## 2023-09-04 LAB — CBC
HCT: 28.8 % — ABNORMAL LOW (ref 36.0–46.0)
Hemoglobin: 8.1 g/dL — ABNORMAL LOW (ref 12.0–15.0)
MCH: 17.6 pg — ABNORMAL LOW (ref 26.0–34.0)
MCHC: 28.1 g/dL — ABNORMAL LOW (ref 30.0–36.0)
MCV: 62.5 fL — ABNORMAL LOW (ref 80.0–100.0)
Platelets: 441 10*3/uL — ABNORMAL HIGH (ref 150–400)
RBC: 4.61 MIL/uL (ref 3.87–5.11)
RDW: 21 % — ABNORMAL HIGH (ref 11.5–15.5)
WBC: 6.6 10*3/uL (ref 4.0–10.5)
nRBC: 0 % (ref 0.0–0.2)

## 2023-09-04 LAB — IRON AND TIBC
Iron: 13 ug/dL — ABNORMAL LOW (ref 28–170)
Saturation Ratios: 4 % — ABNORMAL LOW (ref 10.4–31.8)
TIBC: 347 ug/dL (ref 250–450)
UIBC: 334 ug/dL

## 2023-09-04 LAB — HCG, SERUM, QUALITATIVE: Preg, Serum: NEGATIVE

## 2023-09-04 LAB — PREPARE RBC (CROSSMATCH)

## 2023-09-04 MED ORDER — SODIUM CHLORIDE 0.9% IV SOLUTION: Freq: Once | INTRAVENOUS | Status: AC

## 2023-09-04 MED ORDER — KETOROLAC TROMETHAMINE 10 MG PO TABS: 10.0000 mg | ORAL_TABLET | Freq: Four times a day (QID) | ORAL | 0 refills | Status: DC | PRN

## 2023-09-04 MED ORDER — ACETAMINOPHEN ER 650 MG PO TBCR: 650.0000 mg | EXTENDED_RELEASE_TABLET | Freq: Three times a day (TID) | ORAL | 3 refills | Status: DC | PRN

## 2023-09-04 MED ORDER — NORETHINDRONE ACETATE 5 MG PO TABS: 5.0000 mg | ORAL_TABLET | Freq: Every day | ORAL | 2 refills | Status: DC

## 2023-09-04 MED ORDER — FERROUS SULFATE 325 (65 FE) MG PO TABS: 325.0000 mg | ORAL_TABLET | Freq: Every day | ORAL | 1 refills | Status: AC

## 2023-09-04 NOTE — ED Notes (Signed)
Bag lunch provided per EDP.

## 2023-09-04 NOTE — ED Notes (Signed)
Written consent obtained at this time.  All questions answered.

## 2023-09-04 NOTE — ED Provider Notes (Signed)
[place] EMERGENCY DEPARTMENT AT [facility] Provider Note   CSN: [id number] Arrival date & time: 09/04/23  1235     History {Add pertinent medical, surgical, social history, OB history to HPI:1} No chief complaint on file.   [name] [name] is a 37 y.o. female.  HPI 37 year old female history of depression, anxiety, migraines presenting for anemia.  Patient states she recently saw her PCP earlier this week.  They did some blood work her hemoglobin came back at 8.7.  She was called multiple times by her doctor's office this week telling her that she needed to go to the emergency department for blood transfusion and iron transfusion.  She did not go yesterday.  She saw her OB/GYN today who wanted to try to get her with IV iron infusion as an outpatient.  However she is feeling somewhat more tired if she presents for evaluation.  She has had some intermittent dizziness when she stands especially.  She has not had any syncope.  She has a history of irregular periods as well as spotting.  She has not had any vaginal bleeding at all for 2 weeks.  No blood in her urine or stool.  No vomiting of blood.  She is not on any blood thinners.  She has history of iron deficiency anemia, she is post take iron supplements but has not been able to afford it.  She has no current abdominal pain or vaginal bleeding.    Home Medications Prior to Admission medications   Medication Sig Start Date End Date Taking? Authorizing Provider  acetaminophen (TYLENOL 8 HOUR) 650 MG CR tablet Take 1 tablet (650 mg total) by mouth every 8 (eight) hours as needed for pain. 09/04/23   Earley Favor, MD  ketorolac (TORADOL) 10 MG tablet Take 1 tablet (10 mg total) by mouth every 6 (six) hours as needed. 09/04/23   Earley Favor, MD  metroNIDAZOLE (METROGEL) 0.75 % vaginal gel Place vaginally. 08/30/23 09/06/23  [provider]  norethindrone (AYGESTIN) 5 MG tablet Take 1 tablet (5 mg total) by mouth  daily. 09/04/23 12/03/23  Earley Favor, MD  albuterol (VENTOLIN HFA) 108 (90 Base) MCG/ACT inhaler Inhale 1-2 puffs into the lungs every 6 (six) hours as needed for wheezing or shortness of breath. Patient taking differently: Inhale 1-2 puffs into the lungs every 6 (six) hours as needed for wheezing or shortness of breath. Out of inhaler 07/03/19 12/02/20  Zadie Rhine, MD      Allergies    Penicillins    Review of Systems   Review of Systems Review of systems completed and notable as per HPI.  ROS otherwise negative.   Physical Exam Updated Vital Signs BP 110/63 (BP Location: Right Arm)   Pulse 64   Temp 97.8 F (36.6 C) (Oral)   Resp 16   SpO2 100%  Physical Exam Vitals and nursing note reviewed.  Constitutional:      General: She is not in acute distress.    Appearance: She is well-developed.  HENT:     Head: Normocephalic and atraumatic.     Nose: Nose normal.     Mouth/Throat:     Mouth: Mucous membranes are moist.     Pharynx: Oropharynx is clear.  Eyes:     Extraocular Movements: Extraocular movements intact.     Conjunctiva/sclera: Conjunctivae normal.     Pupils: Pupils are equal, round, and reactive to light.  Cardiovascular:     Rate and Rhythm: Normal  rate and regular rhythm.     Heart sounds: No murmur heard. Pulmonary:     Effort: Pulmonary effort is normal. No respiratory distress.     Breath sounds: Normal breath sounds.  Abdominal:     Palpations: Abdomen is soft.     Tenderness: There is no abdominal tenderness. There is no guarding or rebound.  Musculoskeletal:        General: No swelling.     Cervical back: Neck supple.     Right lower leg: No edema.     Left lower leg: No edema.  Skin:    General: Skin is warm and dry.     Capillary Refill: Capillary refill takes less than 2 seconds.  Neurological:     General: No focal deficit present.     Mental Status: She is alert and oriented to person, place, and time. Mental status is at  baseline.  Psychiatric:        Mood and Affect: Mood normal.     ED Results / Procedures / Treatments   Labs (all labs ordered are listed, but only abnormal results are displayed) Labs Reviewed  CBC - Abnormal; Notable for the following components:      Result Value   Hemoglobin 8.1 (*)    HCT 28.8 (*)    MCV 62.5 (*)    MCH 17.6 (*)    MCHC 28.1 (*)    RDW 21.0 (*)    Platelets 441 (*)    All other components within normal limits  COMPREHENSIVE METABOLIC PANEL - Abnormal; Notable for the following components:   Sodium 134 (*)    CO2 20 (*)    Albumin 3.4 (*)    AST 13 (*)    All other components within normal limits  IRON AND TIBC - Abnormal; Notable for the following components:   Iron 13 (*)    Saturation Ratios 4 (*)    All other components within normal limits  TYPE AND SCREEN    EKG None  Radiology No results found.  Procedures Procedures  {Document cardiac monitor, telemetry assessment procedure when appropriate:1}  Medications Ordered in ED Medications - No data to display  ED Course/ Medical Decision Making/ A&P   {   Click here for ABCD2, HEART and other calculatorsREFRESH Note before signing :1}                              Medical Decision Making Amount and/or Complexity of Data Reviewed Labs: ordered.   Medical Decision Making:   [name] is a 37 y.o. female who presented to the ED today with anemia.  Vital signs reviewed.  Here she is well-appearing, she does report worsening fatigue as well as some orthostasis likely related to her chronic iron deficiency anemia.  Labwork here shows hemoglobin 8.1, she was 8.77 days ago.  She has no ongoing blood loss but does have history of heavy periods and irregular periods with spotting.  She is no current bleeding, I do not think she needs a pelvic exam or ultrasound at this time.  Has no other signs of bleed including GI bleeding or blood in her urine.  I suspect this is chronic anemia which has  become symptomatic.  She has not been taking her iron supplementation, based on her labs appears to be iron deficiency anemia.  She is been sent here by her PCP as it seems they are unable to  arrange transfusion or iron infusion as an outpatient although appears her OB/GYN is trying to arrange outpatient iron infusion.  Given she is symptomatic, I think is reasonable to give her a unit of blood here and see if this helps her symptomatically.   {crccomplexity:27900} Reviewed and confirmed nursing documentation for past medical history, family history, social history.  Reassessment and Plan:   ***    Patient's presentation is most consistent with {EM COPA:27473}     {Document critical care time when appropriate:1} {Document review of labs and clinical decision tools ie heart score, Chads2Vasc2 etc:1}  {Document your independent review of radiology images, and any outside records:1} {Document your discussion with family members, caretakers, and with consultants:1} {Document social determinants of health affecting pt's care:1} {Document your decision making why or why not admission, treatments were needed:1} Final Clinical Impression(s) / ED Diagnoses Final diagnoses:  None    Rx / DC Orders ED Discharge Orders     None

## 2023-09-04 NOTE — Progress Notes (Signed)
37 y.o. y.o. female here for pelvic pain, cramping and DUB. Has been to urgent care and ER with no other causes.  History of tubal ligation  She states she has always had cramping since she started her period. She is now having annoying cramping that sometimes wakes her up 2-3 times a week. She is anemic and is scheduling IV iron She has had STI's in the past. She the cramping starts from the middle or now from the left LQ and radiates to above her pubic bone and across her pelvis. She has not had a regular cycle in July or August. Reports in July she bled Monday through Thursday with pink blood and moderate clots.  Friday she stopped and Saturday she was pouring out blood. She reports that she could not use the tampon but had to use the overnight pads and these were saturated. This month, she reports severe cramps again  and  Blood with wiping She reports she was checked with her PMD for STI recently and was negative. She did mention that one of her providers mentioned a polyp. She does not want any birth control options that make her bleed and she is a smoker She is considering the [facility]  Blood pressure 116/74, pulse 68, height 5' 6.75" (1.695 m), weight 184 lb (83.5 kg), SpO2 98%.  2015 TV US ative  CLINICAL DATA:  pelvic cramping  EXAM: TRANSABDOMINAL AND TRANSVAGINAL ULTRASOUND OF PELVIS  TECHNIQUE: Study was performed transabdominally to optimize pelvic field of view evaluation and transvaginally to optimize internal visceral architecture evaluation.  COMPARISON:  CT abdomen and pelvis December 12, 2005  FINDINGS: Uterus  Measurements: 8.2 x 5.1 x 6.5 cm. No dominant mass is seen by ultrasound. There are scattered 2-3 mm echogenic foci which may represent small calcifications due to leiomyomatous change without well-defined mass. Uterus is retroverted.  Endometrium  Thickness: 4 mm.  No focal abnormality visualized.  Right ovary  Measurements: 3.5 x 1.4 x  2.2 cm. Normal appearance/no adnexal mass.  Left ovary  Measurements: 2.6 x 2.8 x 2.2 cm. Normal appearance/no adnexal mass.  Other findings  There is a small amount of free pelvic fluid.  IMPRESSION: Scattered small calcifications in the uterus which may represent leiomyomatous change. There is no well-defined mass. The overall echotexture of the uterus appears unremarkable. The endometrium is not thickened. Uterus is retroverted.  Small amount of free pelvic fluid may be due to recent ovarian cyst rupture. Note that currently there is no extrauterine pelvic or adnexal mass appreciable.   Electronically Signed   By: Bretta Bang M.D.   On: 07/12/2014 14:36         Component Value Date/Time   DIAGPAP (A) 11/11/2018 0000    ATYPICAL SQUAMOUS CELLS OF UNDETERMINED SIGNIFICANCE (ASC-US).   DIAGPAP TRICHOMONAS VAGINALIS PRESENT. (A) 11/11/2018 0000   ADEQPAP (A) 11/11/2018 0000    Satisfactory for evaluation  endocervical/transformation zone component PRESENT.    GYN HISTORY:    Component Value Date/Time   DIAGPAP (A) 11/11/2018 0000    ATYPICAL SQUAMOUS CELLS OF UNDETERMINED SIGNIFICANCE (ASC-US).   DIAGPAP TRICHOMONAS VAGINALIS PRESENT. (A) 11/11/2018 0000   ADEQPAP (A) 11/11/2018 0000    Satisfactory for evaluation  endocervical/transformation zone component PRESENT.   H/o STI with treatment OB History  Gravida Para Term Preterm AB Living  5 4 3 1 1 4   SAB IAB Ectopic Multiple Live Births  1 0 0 0 4    # Outcome Date GA  Lbr Len/2nd Weight Sex Type Anes PTL Lv  5 Term 11/29/18 [redacted]w[redacted]d 03:21 / 00:08 5 lb 2.7 oz (2.345 kg) M Vag-Spont None  LIV     Birth Comments: WNL  4 Term 04/27/16 [redacted]w[redacted]d 01:50 / 00:05 5 lb 10.7 oz (2.57 kg) F Vag-Spont None  LIV  3 Preterm 09/04/06 [redacted]w[redacted]d  4 lb 6 oz (1.984 kg) F Vag-Spont   LIV     Birth Comments: baby had gastroscesis  2 Term 05/27/03 [redacted]w[redacted]d  6 lb 10 oz (3.005 kg) M Vag-Spont  N LIV  1 SAB             Past Medical  History:  Diagnosis Date   Abscess of axilla    Anal fissure 2014   Anemia    Anxiety    Bipolar 1 disorder (HCC)    Chronic bronchitis (HCC)    COVID-19 02/28/2020   LOSS OF TASTE AND SMELL, NOW RESOLVED AFTER QUARANTINE   Depression    bipolar; not on meds   Headache(784.0)    migraine   Heart murmur    as a child none now   Hemorrhoids    Hypokalemia 06/17/2012   Migraines    Perianal abscess 12/2017   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   Substance abuse (HCC)    Suicide attempt (HCC) 06/19/2012   UTI (lower urinary tract infection) 06/19/2012    Past Surgical History:  Procedure Laterality Date   DENTAL SURGERY     HEMORRHOID SURGERY N/A 03/25/2020   Procedure: HEMORRHOIDECTOMY;  Surgeon: Karie Soda, MD;  Location: [facility] [place];  Service: General;  Laterality: N/A;   INCISE AND DRAIN ABCESS  01/16/2018   INCISION AND DRAINAGE PERIRECTAL ABSCESS N/A 01/16/2018   Procedure: IRRIGATION AND DEBRIDEMENT PERIRECTAL ABSCESS;  Surgeon: Rodman Pickle, MD;  Location: [facility];  Service: General;  Laterality: N/A;   LAPAROSCOPIC TUBAL LIGATION Bilateral 02/05/2019   Procedure: LAPAROSCOPIC TUBAL LIGATION WITH CERVICAL POLYPECTOMY;  Surgeon: Allie Bossier, MD;  Location: [facility];  Service: Gynecology;  Laterality: Bilateral;   RECTAL EXAM UNDER ANESTHESIA N/A 03/25/2020   Procedure: ANORECTAL EXAM UNDER ANESTHESIA, REPAIR OF PERIRECTAL FISTULA;  Surgeon: Karie Soda, MD;  Location: [facility] [place];  Service: General;  Laterality: N/A;    Current Outpatient Medications on File Prior to Visit  Medication Sig Dispense Refill   metroNIDAZOLE (METROGEL) 0.75 % vaginal gel Place vaginally.     [DISCONTINUED] albuterol (VENTOLIN HFA) 108 (90 Base) MCG/ACT inhaler Inhale 1-2 puffs into the lungs every 6 (six) hours as needed for wheezing or shortness of breath. (Patient taking differently: Inhale 1-2 puffs into the lungs every 6 (six)  hours as needed for wheezing or shortness of breath. Out of inhaler) 6.7 g 1   No current facility-administered medications on file prior to visit.    Social History   Socioeconomic History   Marital status: Single    Spouse name: Not on file   Number of children: Not on file   Years of education: Not on file   Highest education level: Not on file  Occupational History   Not on file  Tobacco Use   Smoking status: Every Day    Current packs/day: 0.00    Types: Cigarettes    Last attempt to quit: 01/14/2009    Years since quitting: 14.6   Smokeless tobacco: Never   Tobacco comments:    3 cigarettes per day  Vaping Use   Vaping status:  Every Day   Substances: Nicotine, Flavoring  Substance and Sexual Activity   Alcohol use: Yes    Comment: occ   Drug use: Not Currently    Frequency: 3.0 times per week    Types: Marijuana, Cocaine    Comment: last time was 2019 for cocaine, none since occ marijuana   Sexual activity: Yes    Partners: Male    Birth control/protection: None  Other Topics Concern   Not on file  Social History Narrative   Not on file   Social Determinants of Health   Financial Resource Strain: Not on file  Food Insecurity: Low Risk  (07/05/2023)   Received from [facility]   Hunger Vital Sign    Worried About Running Out of Food in the Last Year: Never true    Ran Out of Food in the Last Year: Never true  Transportation Needs: Not on file (07/05/2023)  Physical Activity: Not on file  Stress: Not on file  Social Connections: Not on file  Intimate Partner Violence: Not on file    Family History  Problem Relation Age of Onset   Hypertension Mother    Diabetes type II Mother    Arthritis Mother    Hyperlipidemia Mother    Cancer Father        lung     Allergies  Allergen Reactions   Penicillins Nausea And Vomiting    Has patient had a PCN reaction causing immediate rash, facial/tongue/throat swelling, SOB or lightheadedness with hypotension:  No Has patient had a PCN reaction causing severe rash involving mucus membranes or skin necrosis: No Has patient had a PCN reaction that required hospitalization Yes Has patient had a PCN reaction occurring within the last 10 years: No If all of the above answers are "NO", then may proceed with Cephalosporin use.       Patient's last menstrual period was No LMP recorded..          Sexually active: yes     Review of Systems Alls systems reviewed and are negative.    A:         DUB, menorrhagia, dysmenorrhea history of STIs, history of fibroids                             P:       RTC for annual, EMB, pap and cultures in 1-2 weeks            Schedule TV US            All options were reviewed and the risk benefits alternatives were discussed of each. To begin aygestin to reduce and control bleeding. She is considering the robotic hysterectomy.  Counseled extensively on the procedure including but not limited to what to expect and risks and benefits.  Counseled on postop care and pelvic rest for 10 weeks after the surgery with restricted lifting for 6 weeks after.  Counseled on the benefits of the robotic procedure with faster return to daily activities, improved outcomes, and less risk for complications.  To continue Aygestin until she decides on surgery or not.  A referral for surgery was placed. She would like to have this scheduled.         schedule iv iron treatment for the anemia  Ashby Dawes Karlos Scadden  30 minutes spent on reviewing records, imaging,  and one on one patient time and counseling patient and documentation Dr. Karma Greaser

## 2023-09-04 NOTE — Patient Instructions (Signed)
The vaginal ultrasound order has been placed, all you need to do is call the scheduling desk at [phone] to make the appointment at one of our outpatient imaging centers at your earliest convenience. This is to evaluate your uterus and ovaries.  The report will go directly to epic after.

## 2023-09-04 NOTE — ED Notes (Signed)
Patient denies pain and is resting comfortably.  

## 2023-09-04 NOTE — ED Notes (Signed)
No signs of reaction noted after 15 mins.

## 2023-09-04 NOTE — ED Triage Notes (Signed)
Pt here from home sent over by Md for possible blood or iron infusion due to chronically low levels , hx of same ,

## 2023-09-05 LAB — URINALYSIS, COMPLETE W/RFL CULTURE
Bilirubin Urine: NEGATIVE
Glucose, UA: NEGATIVE
Hgb urine dipstick: NEGATIVE
Hyaline Cast: NONE SEEN /LPF
Ketones, ur: NEGATIVE
Leukocyte Esterase: NEGATIVE
Nitrites, Initial: NEGATIVE
Protein, ur: NEGATIVE
RBC / HPF: NONE SEEN /HPF (ref 0–2)
Specific Gravity, Urine: 1.023 (ref 1.001–1.035)
pH: 6 (ref 5.0–8.0)

## 2023-09-05 LAB — TYPE AND SCREEN
ABO/RH(D): O POS
Antibody Screen: NEGATIVE
Unit division: 0

## 2023-09-05 LAB — BPAM RBC
Blood Product Expiration Date: 202410162359
ISSUE DATE / TIME: 202409172101
Unit Type and Rh: 5100

## 2023-09-05 LAB — CULTURE INDICATED

## 2023-09-05 NOTE — Discharge Instructions (Signed)
Call your doctor tomorrow to schedule close follow-up and repeat blood work later this week.  You were given a unit of blood here for your low hemoglobin.  I recommend you follow-up closely with your doctor.  If you develop dizziness, fainting, persistent bleeding that is not stopping you should return to the ED.  You should start taking the prescribed iron supplement.

## 2023-09-05 NOTE — Addendum Note (Signed)
Addended by: Earley Favor on: 09/05/2023 09:19 AM   Modules accepted: Orders

## 2023-09-12 ENCOUNTER — Ambulatory Visit (HOSPITAL_COMMUNITY)
Admission: RE | Admit: 2023-09-12 | Discharge: 2023-09-12 | Disposition: A | Discharge status: Home / Self Care | Payer: Medicaid Other | Source: Ambulatory Visit | Attending: Obstetrics and Gynecology | Admitting: Obstetrics and Gynecology

## 2023-09-12 DIAGNOSIS — N938 Other specified abnormal uterine and vaginal bleeding: Secondary | ICD-10-CM | POA: Diagnosis present

## 2023-09-12 DIAGNOSIS — R102 Pelvic and perineal pain: Secondary | ICD-10-CM | POA: Insufficient documentation

## 2023-09-19 ENCOUNTER — Other Ambulatory Visit: Payer: Self-pay

## 2023-09-19 ENCOUNTER — Other Ambulatory Visit (HOSPITAL_COMMUNITY)
Admission: RE | Admit: 2023-09-19 | Discharge: 2023-09-19 | Disposition: A | Discharge status: Home / Self Care | Payer: Medicaid Other | Source: Ambulatory Visit | Attending: Obstetrics and Gynecology | Admitting: Obstetrics and Gynecology

## 2023-09-19 ENCOUNTER — Ambulatory Visit (INDEPENDENT_AMBULATORY_CARE_PROVIDER_SITE_OTHER): Payer: Medicaid Other | Admitting: Obstetrics and Gynecology

## 2023-09-19 ENCOUNTER — Telehealth: Payer: Self-pay | Admitting: *Deleted

## 2023-09-19 ENCOUNTER — Encounter (HOSPITAL_BASED_OUTPATIENT_CLINIC_OR_DEPARTMENT_OTHER): Payer: Self-pay | Admitting: Obstetrics and Gynecology

## 2023-09-19 ENCOUNTER — Encounter: Payer: Self-pay | Admitting: Obstetrics and Gynecology

## 2023-09-19 ENCOUNTER — Encounter: Payer: Self-pay | Admitting: *Deleted

## 2023-09-19 VITALS — BP 114/70 | HR 71

## 2023-09-19 DIAGNOSIS — N921 Excessive and frequent menstruation with irregular cycle: Secondary | ICD-10-CM | POA: Insufficient documentation

## 2023-09-19 DIAGNOSIS — D500 Iron deficiency anemia secondary to blood loss (chronic): Secondary | ICD-10-CM | POA: Insufficient documentation

## 2023-09-19 DIAGNOSIS — N938 Other specified abnormal uterine and vaginal bleeding: Secondary | ICD-10-CM | POA: Diagnosis not present

## 2023-09-19 DIAGNOSIS — Z01812 Encounter for preprocedural laboratory examination: Secondary | ICD-10-CM | POA: Insufficient documentation

## 2023-09-19 DIAGNOSIS — R102 Pelvic and perineal pain: Secondary | ICD-10-CM

## 2023-09-19 LAB — PREGNANCY, URINE: Preg Test, Ur: NEGATIVE

## 2023-09-19 LAB — CBC
HCT: 33.9 % — ABNORMAL LOW (ref 35.0–45.0)
Hemoglobin: 9 g/dL — ABNORMAL LOW (ref 11.7–15.5)
MCH: 18.1 pg — ABNORMAL LOW (ref 27.0–33.0)
MCHC: 26.5 g/dL — ABNORMAL LOW (ref 32.0–36.0)
MCV: 68.1 fL — ABNORMAL LOW (ref 80.0–100.0)
MPV: 10.3 fL (ref 7.5–12.5)
Platelets: 513 10*3/uL — ABNORMAL HIGH (ref 140–400)
RBC: 4.98 10*6/uL (ref 3.80–5.10)
RDW: 21.3 % — ABNORMAL HIGH (ref 11.0–15.0)
WBC: 7.6 10*3/uL (ref 3.8–10.8)

## 2023-09-19 MED ORDER — OXYCODONE HCL 5 MG PO TABS
5.0000 mg | ORAL_TABLET | ORAL | 0 refills | Status: DC | PRN
Start: 2023-09-19 — End: 2023-10-08

## 2023-09-19 MED ORDER — ACETAMINOPHEN 325 MG PO TABS
650.0000 mg | ORAL_TABLET | Freq: Four times a day (QID) | ORAL | Status: DC | PRN
Start: 2023-09-19 — End: 2023-09-19

## 2023-09-19 MED ORDER — ACETAMINOPHEN ER 650 MG PO TBCR: 650.0000 mg | EXTENDED_RELEASE_TABLET | Freq: Three times a day (TID) | ORAL | 3 refills | Status: DC | PRN

## 2023-09-19 MED ORDER — KETOROLAC TROMETHAMINE 15 MG/ML IJ SOLN
15.0000 mg | Freq: Once | INTRAMUSCULAR | Status: AC
Start: 2023-09-19 — End: 2023-09-19
  Administered 2023-09-19: 15 mg via INTRAMUSCULAR

## 2023-09-19 MED ORDER — METOCLOPRAMIDE HCL 10 MG PO TABS: 10.0000 mg | ORAL_TABLET | Freq: Four times a day (QID) | ORAL | 0 refills | Status: DC | PRN

## 2023-09-19 MED ORDER — IBUPROFEN 800 MG PO TABS: 800.0000 mg | ORAL_TABLET | Freq: Three times a day (TID) | ORAL | 1 refills | Status: AC | PRN

## 2023-09-19 MED ORDER — NORETHINDRONE ACETATE 5 MG PO TABS: 10.0000 mg | ORAL_TABLET | Freq: Every day | ORAL | 0 refills | Status: DC

## 2023-09-19 NOTE — Progress Notes (Signed)
Your procedure is scheduled on Monday, 09/24/2023.  Report to [facility] AT  10:00 AM.   Call this number if you have problems the morning of surgery  :[phone].   OUR ADDRESS IS [address].  WE ARE LOCATED IN [facility].  PLEASE BRING YOUR INSURANCE CARD AND PHOTO ID DAY OF SURGERY.  ONLY 2 PEOPLE ARE ALLOWED IN  WAITING  ROOM                                      REMEMBER:  DO NOT EAT FOOD, CANDY GUM OR MINTS  AFTER MIDNIGHT THE NIGHT BEFORE YOUR SURGERY . YOU MAY HAVE CLEAR LIQUIDS FROM MIDNIGHT THE NIGHT BEFORE YOUR SURGERY UNTIL  9:00 AM. NO CLEAR LIQUIDS AFTER   9:00 AM DAY OF SURGERY.  YOU MAY  BRUSH YOUR TEETH MORNING OF SURGERY AND RINSE YOUR MOUTH OUT, NO CHEWING GUM CANDY OR MINTS.     CLEAR LIQUID DIET    Allowed      Water                                                                   Coffee and tea, regular and decaf  (NO cream or milk products of any type, may sweeten)                         Carbonated beverages, regular and diet                                    Sports drinks like Gatorade _____________________________________________________________________     TAKE ONLY THESE MEDICATIONS MORNING OF SURGERY: norethindrone, Qvar inhaler if needed (Please bring inhaler with you on the day of surgery.)                                        DO NOT WEAR JEWERLY/  METAL/  PIERCINGS (INCLUDING NO PLASTIC PIERCINGS) DO NOT WEAR LOTIONS, POWDERS, PERFUMES OR NAIL POLISH ON YOUR FINGERNAILS. TOENAIL POLISH IS OK TO WEAR. DO NOT SHAVE FOR 48 HOURS PRIOR TO DAY OF SURGERY.  CONTACTS, GLASSES, OR DENTURES MAY NOT BE WORN TO SURGERY.  REMEMBER: NO SMOKING, VAPING ,  DRUGS OR ALCOHOL FOR 24 HOURS BEFORE YOUR SURGERY.                                    [facility] IS NOT RESPONSIBLE  FOR ANY BELONGINGS.                                                                    Marland Kitchen  [place] - Preparing for  Surgery Before surgery, you can play an important role.  Because skin is not sterile, your skin needs to be as free of germs as possible.  You can reduce the number of germs on your skin by washing with CHG (chlorahexidine gluconate) soap before surgery.  CHG is an antiseptic cleaner which kills germs and bonds with the skin to continue killing germs even after washing. Please DO NOT use if you have an allergy to CHG or antibacterial soaps.  If your skin becomes reddened/irritated stop using the CHG and inform your nurse when you arrive at Short Stay. Do not shave (including legs and underarms) for at least 48 hours prior to the first CHG shower.  You may shave your face/neck. Please follow these instructions carefully:  1.  Shower with CHG Soap the night before surgery and the  morning of Surgery.  2.  If you choose to wash your hair, wash your hair first as usual with your  normal  shampoo.  3.  After you shampoo, rinse your hair and body thoroughly to remove the  shampoo.                                        4.  Use CHG as you would any other liquid soap.  You can apply chg directly  to the skin and wash , chg soap provided, night before and morning of your surgery.  5.  Apply the CHG Soap to your body ONLY FROM THE NECK DOWN.   Do not use on face/ open                           Wound or open sores. Avoid contact with eyes, ears mouth and genitals (private parts).                       Wash face,  Genitals (private parts) with your normal soap.             6.  Wash thoroughly, paying special attention to the area where your surgery  will be performed.  7.  Thoroughly rinse your body with warm water from the neck down.  8.  DO NOT shower/wash with your normal soap after using and rinsing off  the CHG Soap.             9.  Pat yourself dry with a clean towel.            10.  Wear clean pajamas.            11.  Place clean sheets on your bed the night of your first shower and do not  sleep with  pets. Day of Surgery : Do not apply any lotions/ powders the morning of surgery.  Please wear clean clothes to the hospital/surgery center.  IF YOU HAVE ANY SKIN IRRITATION OR PROBLEMS WITH THE SURGICAL SOAP, PLEASE GET A BAR OF GOLD DIAL SOAP AND SHOWER THE NIGHT BEFORE YOUR SURGERY AND THE MORNING OF YOUR SURGERY. PLEASE LET THE NURSE KNOW MORNING OF YOUR SURGERY IF YOU HAD ANY PROBLEMS WITH THE SURGICAL SOAP.   YOUR SURGEON MAY HAVE REQUESTED EXTENDED RECOVERY TIME AFTER YOUR SURGERY. IT COULD BE A  JUST A FEW HOURS  UP TO AN OVERNIGHT STAY.  YOUR SURGEON SHOULD HAVE  DISCUSSED THIS WITH YOU PRIOR TO YOUR SURGERY. IN THE EVENT YOU NEED TO STAY OVERNIGHT PLEASE REFER TO THE FOLLOWING GUIDELINES. YOU MAY HAVE UP TO 4 VISITORS  MAY VISIT IN THE EXTENDED RECOVERY ROOM UNTIL 800 PM ONLY.  ONE  VISITOR AGE 39 AND OVER MAY SPEND THE NIGHT AND MUST BE IN EXTENDED RECOVERY ROOM NO LATER THAN 800 PM . YOUR DISCHARGE TIME AFTER YOU SPEND THE NIGHT IS 900 AM THE MORNING AFTER YOUR SURGERY. YOU MAY PACK A SMALL OVERNIGHT BAG WITH TOILETRIES FOR YOUR OVERNIGHT STAY IF YOU WISH.  REGARDLESS OF IF YOU STAY OVER NIGHT OR ARE DISCHARGED THE SAME DAY YOU WILL BE REQUIRED TO HAVE A RESPONSIBLE ADULT (18 YRS OLD OR OLDER) STAY WITH YOU FOR AT LEAST THE FIRST 24 HOURS  YOUR PRESCRIPTION MEDICATIONS WILL BE PROVIDED DURING YOUR HOSPITAL STAY.  ________________________________________________________________________                                                        QUESTIONS [name] [name] PRE OP NURSE PHONE [phone].

## 2023-09-19 NOTE — Addendum Note (Signed)
Addended by: Earley Favor on: 09/19/2023 05:48 PM   Modules accepted: Orders

## 2023-09-19 NOTE — Progress Notes (Signed)
Spoke w/ via phone for pre-op interview---[name] [name] needs dos---- type & screen (recent blood transfusion on 09/05/23), cbc, UPT  per anesthesia, surgeon orders pending     [name] results------09/04/23 cbc with Hgb 8.1 COVID test -----patient states asymptomatic no test needed Arrive at -------1000 on Monday, 09/24/2023 NPO after MN NO Solid Food.  Clear liquids from MN until---0900 Med rec completed Medications to take morning of surgery -----norethindrone, Qvar inhaler prn (please bring) Diabetic medication -----n/a Patient instructed no nail polish to be worn day of surgery Patient instructed to bring photo id and insurance card day of surgery Patient aware to have Driver (ride ) / caregiver    for 24 hours after surgery - mother Patient Special Instructions -----Extended / overnight stay instructions given. No smoking or alcohol for 24 hours prior to surgery. Pre-Op special Instructions -----Case added today, 09/19/23. Surgeon orders pending. Patient verbalized understanding of instructions that were given at this phone interview. Patient denies chest pain, sob, fever, cough at the interview.

## 2023-09-19 NOTE — Telephone Encounter (Signed)
Spoke with patient regarding upcoming surgery, see surgery referral.   Patient picked up norethindrone, will start medication this afternoon.   Did not have money at the time for iron and copay was unable to be waived. Patient states she is returning to get medication. Will let me know if she is unable to get Rx.   Patient states it has been several years since she received IV iron and is unsure where to get this. Advised we will review referral to hematology, they will contact you directly to schedule. Advised there may be additional recommendations once labs return. I will forward to Dr. Karma Greaser for review and provide update. Patient verbalizes understanding and is agreeable.  Dr. Karma Greaser -I have pended an urgent referral to hematology. May need to review alternative options if treatment needed before 10/7.

## 2023-09-19 NOTE — Progress Notes (Incomplete Revision)
37 y.o. y.o. female here for pelvic pain, cramping and DUB. Has been to urgent care and ER with no other causes. Here today again with 2 weeks of bleeding.  Left here fat last visit and went for a transfusion 1 unit PRBC at ER hg 8 with her iv iron. She started bleeding again with clots with 6 pads and tampons a day. She lost her aygestin and is not taking it.  She is frustrated with the bleeding and would like a hysterectomy asap. She is also cramping severely today She understands the [facility] is permanent procedure and she will not be able to conceive in the future.  Medicaid form signed. History of tubal ligation  She states she has always had cramping since she started her period. She is now having annoying cramping that sometimes wakes her up 2-3 times a week. She is anemic and is scheduling IV iron She has had STI's in the past. She the cramping starts from the middle or now from the left LQ and radiates to above her pubic bone and across her pelvis. She has not had a regular cycle in July or August. Reports in July she bled Monday through Thursday with pink blood and moderate clots.  Friday she stopped and Saturday she was pouring out blood. She reports that she could not use the tampon but had to use the overnight pads and these were saturated. This month, she reports severe cramps again  and  Blood with wiping She reports she was checked with her PMD for STI recently and was negative. She did mention that one of her providers mentioned a polyp. She does not want any birth control options that make her bleed and she is a smoker She is considering the [facility]  Blood pressure 114/70, pulse 71, last menstrual period 09/19/2023, SpO2 97%.  2015 TV US ative  CLINICAL DATA:  pelvic cramping  EXAM: TRANSABDOMINAL AND TRANSVAGINAL ULTRASOUND OF PELVIS  TECHNIQUE: Study was performed transabdominally to optimize pelvic field of view evaluation and transvaginally to optimize internal  visceral architecture evaluation.  COMPARISON:  CT abdomen and pelvis December 12, 2005  FINDINGS: Uterus  Measurements: 8.2 x 5.1 x 6.5 cm. No dominant mass is seen by ultrasound. There are scattered 2-3 mm echogenic foci which may represent small calcifications due to leiomyomatous change without well-defined mass. Uterus is retroverted.  Endometrium  Thickness: 4 mm.  No focal abnormality visualized.  Right ovary  Measurements: 3.5 x 1.4 x 2.2 cm. Normal appearance/no adnexal mass.  Left ovary  Measurements: 2.6 x 2.8 x 2.2 cm. Normal appearance/no adnexal mass.  Other findings  There is a small amount of free pelvic fluid.  IMPRESSION: Scattered small calcifications in the uterus which may represent leiomyomatous change. There is no well-defined mass. The overall echotexture of the uterus appears unremarkable. The endometrium is not thickened. Uterus is retroverted.  Small amount of free pelvic fluid may be due to recent ovarian cyst rupture. Note that currently there is no extrauterine pelvic or adnexal mass appreciable.   Electronically Signed   By: Bretta Bang M.D.   On: 07/12/2014 14:36         Component Value Date/Time   DIAGPAP (A) 11/11/2018 0000    ATYPICAL SQUAMOUS CELLS OF UNDETERMINED SIGNIFICANCE (ASC-US).   DIAGPAP TRICHOMONAS VAGINALIS PRESENT. (A) 11/11/2018 0000   ADEQPAP (A) 11/11/2018 0000    Satisfactory for evaluation  endocervical/transformation zone component PRESENT.    GYN HISTORY:    Component  Value Date/Time   DIAGPAP (A) 11/11/2018 0000    ATYPICAL SQUAMOUS CELLS OF UNDETERMINED SIGNIFICANCE (ASC-US).   DIAGPAP TRICHOMONAS VAGINALIS PRESENT. (A) 11/11/2018 0000   ADEQPAP (A) 11/11/2018 0000    Satisfactory for evaluation  endocervical/transformation zone component PRESENT.   H/o STI with treatment OB History  Gravida Para Term Preterm AB Living  5 4 3 1 1 4   SAB IAB Ectopic Multiple Live Births  1 0 0 0 4    #  Outcome Date GA Lbr Len/2nd Weight Sex Type Anes PTL Lv  5 Term 11/29/18 [redacted]w[redacted]d 03:21 / 00:08 5 lb 2.7 oz (2.345 kg) M Vag-Spont None  LIV     Birth Comments: WNL  4 Term 04/27/16 [redacted]w[redacted]d 01:50 / 00:05 5 lb 10.7 oz (2.57 kg) F Vag-Spont None  LIV  3 Preterm 09/04/06 [redacted]w[redacted]d  4 lb 6 oz (1.984 kg) F Vag-Spont   LIV     Birth Comments: baby had gastroscesis  2 Term 05/27/03 [redacted]w[redacted]d  6 lb 10 oz (3.005 kg) M Vag-Spont  N LIV  1 SAB             Past Medical History:  Diagnosis Date   Abscess of axilla    Anal fissure 2014   Anemia    Anxiety    Bipolar 1 disorder (HCC)    Chronic bronchitis (HCC)    COVID-19 02/28/2020   LOSS OF TASTE AND SMELL, NOW RESOLVED AFTER QUARANTINE   Depression    bipolar; not on meds   Headache(784.0)    migraine   Heart murmur    as a child none now   Hemorrhoids    Hypokalemia 06/17/2012   Migraines    Perianal abscess 12/2017   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   Substance abuse (HCC)    Suicide attempt (HCC) 06/19/2012   UTI (lower urinary tract infection) 06/19/2012    Past Surgical History:  Procedure Laterality Date   DENTAL SURGERY     HEMORRHOID SURGERY N/A 03/25/2020   Procedure: HEMORRHOIDECTOMY;  Surgeon: Karie Soda, MD;  Location: [facility];  Service: General;  Laterality: N/A;   INCISE AND DRAIN ABCESS  01/16/2018   INCISION AND DRAINAGE PERIRECTAL ABSCESS N/A 01/16/2018   Procedure: IRRIGATION AND DEBRIDEMENT PERIRECTAL ABSCESS;  Surgeon: Rodman Pickle, MD;  Location: [facility] OR;  Service: General;  Laterality: N/A;   LAPAROSCOPIC TUBAL LIGATION Bilateral 02/05/2019   Procedure: LAPAROSCOPIC TUBAL LIGATION WITH CERVICAL POLYPECTOMY;  Surgeon: Allie Bossier, MD;  Location: [facility];  Service: Gynecology;  Laterality: Bilateral;   RECTAL EXAM UNDER ANESTHESIA N/A 03/25/2020   Procedure: ANORECTAL EXAM UNDER ANESTHESIA, REPAIR OF PERIRECTAL FISTULA;  Surgeon: Karie Soda, MD;  Location: [place];  Service: General;  Laterality: N/A;    Current Outpatient Medications on File Prior to Visit  Medication Sig Dispense Refill   ketorolac (TORADOL) 10 MG tablet Take 1 tablet (10 mg total) by mouth every 6 (six) hours as needed. 20 tablet 0   norethindrone (AYGESTIN) 5 MG tablet Take 5 mg by mouth daily.     acetaminophen (TYLENOL 8 HOUR) 650 MG CR tablet Take 1 tablet (650 mg total) by mouth every 8 (eight) hours as needed for pain. (Patient not taking: Reported on 09/19/2023) 60 tablet 3   ferrous sulfate 325 (65 FE) MG tablet Take 1 tablet (325 mg total) by mouth daily. (Patient not taking: Reported on 09/19/2023) 30 tablet 1   [DISCONTINUED] albuterol (  VENTOLIN HFA) 108 (90 Base) MCG/ACT inhaler Inhale 1-2 puffs into the lungs every 6 (six) hours as needed for wheezing or shortness of breath. (Patient taking differently: Inhale 1-2 puffs into the lungs every 6 (six) hours as needed for wheezing or shortness of breath. Out of inhaler) 6.7 g 1   No current facility-administered medications on file prior to visit.    Social History   Socioeconomic History   Marital status: Single    Spouse name: Not on file   Number of children: Not on file   Years of education: Not on file   Highest education level: Not on file  Occupational History   Not on file  Tobacco Use   Smoking status: Every Day    Current packs/day: 0.00    Types: Cigarettes    Last attempt to quit: 01/14/2009    Years since quitting: 14.6   Smokeless tobacco: Never   Tobacco comments:    3 cigarettes per day  Vaping Use   Vaping status: Every Day   Substances: Nicotine, Flavoring  Substance and Sexual Activity   Alcohol use: Yes    Comment: occ   Drug use: Not Currently    Frequency: 3.0 times per week    Types: Marijuana, Cocaine    Comment: last time was 2019 for cocaine, none since occ marijuana   Sexual activity: Yes    Partners: Male    Birth control/protection: Surgical    Comment: btl   Other Topics Concern   Not on file  Social History Narrative   Not on file   Social Determinants of Health   Financial Resource Strain: Not on file  Food Insecurity: Low Risk  (07/05/2023)   Received from [facility]   Hunger Vital Sign    Worried About Running Out of Food in the Last Year: Never true    Ran Out of Food in the Last Year: Never true  Transportation Needs: Not on file (07/05/2023)  Physical Activity: Not on file  Stress: Not on file  Social Connections: Not on file  Intimate Partner Violence: Not on file    Family History  Problem Relation Age of Onset   Hypertension Mother    Diabetes type II Mother    Arthritis Mother    Hyperlipidemia Mother    Cancer Father        lung     Allergies  Allergen Reactions   Penicillins Nausea And Vomiting    Has patient had a PCN reaction causing immediate rash, facial/tongue/throat swelling, SOB or lightheadedness with hypotension: No Has patient had a PCN reaction causing severe rash involving mucus membranes or skin necrosis: No Has patient had a PCN reaction that required hospitalization Yes Has patient had a PCN reaction occurring within the last 10 years: No If all of the above answers are "NO", then may proceed with Cephalosporin use.    SVE: blood seen, no lesions, no CMT  PROCEDURE: EMB Consent obtained for the procedure.  A bivalve speculum was placed in the vagina.  The cervix was grasped with a single tooth tenaculum.  Pipelle was inserted and rotated.  Uterus sound to 8cm.  Adequate specimen was obtained and sent to pathology.  All instruments were removed.  Patient did not tolerate the procedure well.  To notify patient of the results.   Patient's last menstrual period was Patient's last menstrual period was 09/19/2023.Marland Kitchen          Sexually active: yes  Review of Systems Alls systems reviewed and are negative.    A:         DUB, menorrhagia, dysmenorrhea history of STIs, history of fibroids                              P:       EMB, pap and cultures were collected today         Spoke with Carmelina Dane and trying to get surgery at least by Monday.            All options were reviewed and the risk benefits alternatives were discussed of each. To begin aygestin to reduce and control bleeding. She is considering the robotic hysterectomy.  Counseled extensively on the procedure including but not limited to what to expect and risks and benefits.  Counseled on postop care and pelvic rest for 10 weeks after the surgery with restricted lifting for 6 weeks after.  Counseled on the benefits of the robotic procedure with faster return to daily activities, improved outcomes, and less risk for complications.  To continue Aygestin until she decides on surgery or not.  A referral for surgery was placed. She would like to have this scheduled.         schedule iv iron treatment for the anemia  Teola Bradley    Acetaminophen 325mg  tablet x 2 given to patient. Lot [id number], exp 07/2025 Patient tolerated acetaminophen & toradol well.

## 2023-09-19 NOTE — Progress Notes (Signed)
37 y.o. y.o. female here for pelvic pain, cramping and DUB. Has been to urgent care and ER with no other causes. Here today again with 2 weeks of bleeding.  Left here fat last visit and went for a transfusion 1 unit PRBC at ER hg 8 with her iv iron. She started bleeding again with clots with 6 pads and tampons a day. She lost her aygestin and is not taking it.  She is frustrated with the bleeding and would like a hysterectomy asap. She is also cramping severely today She understands the [facility] is permanent procedure and she will not be able to conceive in the future.  Medicaid form signed. History of tubal ligation  She states she has always had cramping since she started her period. She is now having annoying cramping that sometimes wakes her up 2-3 times a week. She is anemic and is scheduling IV iron She has had STI's in the past. She the cramping starts from the middle or now from the left LQ and radiates to above her pubic bone and across her pelvis. She has not had a regular cycle in July or August. Reports in July she bled Monday through Thursday with pink blood and moderate clots.  Friday she stopped and Saturday she was pouring out blood. She reports that she could not use the tampon but had to use the overnight pads and these were saturated. This month, she reports severe cramps again  and  Blood with wiping She reports she was checked with her PMD for STI recently and was negative. She did mention that one of her providers mentioned a polyp. She does not want any birth control options that make her bleed and she is a smoker She is considering the [facility]  Blood pressure 114/70, pulse 71, last menstrual period 09/19/2023, SpO2 97%.  2015 TV US ative  CLINICAL DATA:  pelvic cramping  EXAM: TRANSABDOMINAL AND TRANSVAGINAL ULTRASOUND OF PELVIS  TECHNIQUE: Study was performed transabdominally to optimize pelvic field of view evaluation and transvaginally to optimize internal  visceral architecture evaluation.  COMPARISON:  CT abdomen and pelvis December 12, 2005  FINDINGS: Uterus  Measurements: 8.2 x 5.1 x 6.5 cm. No dominant mass is seen by ultrasound. There are scattered 2-3 mm echogenic foci which may represent small calcifications due to leiomyomatous change without well-defined mass. Uterus is retroverted.  Endometrium  Thickness: 4 mm.  No focal abnormality visualized.  Right ovary  Measurements: 3.5 x 1.4 x 2.2 cm. Normal appearance/no adnexal mass.  Left ovary  Measurements: 2.6 x 2.8 x 2.2 cm. Normal appearance/no adnexal mass.  Other findings  There is a small amount of free pelvic fluid.  IMPRESSION: Scattered small calcifications in the uterus which may represent leiomyomatous change. There is no well-defined mass. The overall echotexture of the uterus appears unremarkable. The endometrium is not thickened. Uterus is retroverted.  Small amount of free pelvic fluid may be due to recent ovarian cyst rupture. Note that currently there is no extrauterine pelvic or adnexal mass appreciable.   Electronically Signed   By: Bretta Bang M.D.   On: 07/12/2014 14:36         Component Value Date/Time   DIAGPAP (A) 11/11/2018 0000    ATYPICAL SQUAMOUS CELLS OF UNDETERMINED SIGNIFICANCE (ASC-US).   DIAGPAP TRICHOMONAS VAGINALIS PRESENT. (A) 11/11/2018 0000   ADEQPAP (A) 11/11/2018 0000    Satisfactory for evaluation  endocervical/transformation zone component PRESENT.    GYN HISTORY:    Component  Value Date/Time   DIAGPAP (A) 11/11/2018 0000    ATYPICAL SQUAMOUS CELLS OF UNDETERMINED SIGNIFICANCE (ASC-US).   DIAGPAP TRICHOMONAS VAGINALIS PRESENT. (A) 11/11/2018 0000   ADEQPAP (A) 11/11/2018 0000    Satisfactory for evaluation  endocervical/transformation zone component PRESENT.   H/o STI with treatment OB History  Gravida Para Term Preterm AB Living  5 4 3 1 1 4   SAB IAB Ectopic Multiple Live Births  1 0 0 0 4    #  Outcome Date GA Lbr Len/2nd Weight Sex Type Anes PTL Lv  5 Term 11/29/18 [redacted]w[redacted]d 03:21 / 00:08 5 lb 2.7 oz (2.345 kg) M Vag-Spont None  LIV     Birth Comments: WNL  4 Term 04/27/16 [redacted]w[redacted]d 01:50 / 00:05 5 lb 10.7 oz (2.57 kg) F Vag-Spont None  LIV  3 Preterm 09/04/06 [redacted]w[redacted]d  4 lb 6 oz (1.984 kg) F Vag-Spont   LIV     Birth Comments: baby had gastroscesis  2 Term 05/27/03 [redacted]w[redacted]d  6 lb 10 oz (3.005 kg) M Vag-Spont  N LIV  1 SAB             Past Medical History:  Diagnosis Date   Abscess of axilla    Anal fissure 2014   Anemia    Anxiety    Bipolar 1 disorder (HCC)    Chronic bronchitis (HCC)    COVID-19 02/28/2020   LOSS OF TASTE AND SMELL, NOW RESOLVED AFTER QUARANTINE   Depression    bipolar; not on meds   Headache(784.0)    migraine   Heart murmur    as a child none now   Hemorrhoids    Hypokalemia 06/17/2012   Migraines    Perianal abscess 12/2017   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   Substance abuse (HCC)    Suicide attempt (HCC) 06/19/2012   UTI (lower urinary tract infection) 06/19/2012    Past Surgical History:  Procedure Laterality Date   DENTAL SURGERY     HEMORRHOID SURGERY N/A 03/25/2020   Procedure: HEMORRHOIDECTOMY;  Surgeon: Karie Soda, MD;  Location: [facility] [place];  Service: General;  Laterality: N/A;   INCISE AND DRAIN ABCESS  01/16/2018   INCISION AND DRAINAGE PERIRECTAL ABSCESS N/A 01/16/2018   Procedure: IRRIGATION AND DEBRIDEMENT PERIRECTAL ABSCESS;  Surgeon: Rodman Pickle, MD;  Location: [facility] OR;  Service: General;  Laterality: N/A;   LAPAROSCOPIC TUBAL LIGATION Bilateral 02/05/2019   Procedure: LAPAROSCOPIC TUBAL LIGATION WITH CERVICAL POLYPECTOMY;  Surgeon: Allie Bossier, MD;  Location: [facility];  Service: Gynecology;  Laterality: Bilateral;   RECTAL EXAM UNDER ANESTHESIA N/A 03/25/2020   Procedure: ANORECTAL EXAM UNDER ANESTHESIA, REPAIR OF PERIRECTAL FISTULA;  Surgeon: Karie Soda, MD;  Location: [facility];  Service: General;  Laterality: N/A;    Current Outpatient Medications on File Prior to Visit  Medication Sig Dispense Refill   ketorolac (TORADOL) 10 MG tablet Take 1 tablet (10 mg total) by mouth every 6 (six) hours as needed. 20 tablet 0   norethindrone (AYGESTIN) 5 MG tablet Take 5 mg by mouth daily.     acetaminophen (TYLENOL 8 HOUR) 650 MG CR tablet Take 1 tablet (650 mg total) by mouth every 8 (eight) hours as needed for pain. (Patient not taking: Reported on 09/19/2023) 60 tablet 3   ferrous sulfate 325 (65 FE) MG tablet Take 1 tablet (325 mg total) by mouth daily. (Patient not taking: Reported on 09/19/2023) 30 tablet 1   [DISCONTINUED] albuterol (  VENTOLIN HFA) 108 (90 Base) MCG/ACT inhaler Inhale 1-2 puffs into the lungs every 6 (six) hours as needed for wheezing or shortness of breath. (Patient taking differently: Inhale 1-2 puffs into the lungs every 6 (six) hours as needed for wheezing or shortness of breath. Out of inhaler) 6.7 g 1   No current facility-administered medications on file prior to visit.    Social History   Socioeconomic History   Marital status: Single    Spouse name: Not on file   Number of children: Not on file   Years of education: Not on file   Highest education level: Not on file  Occupational History   Not on file  Tobacco Use   Smoking status: Every Day    Current packs/day: 0.00    Types: Cigarettes    Last attempt to quit: 01/14/2009    Years since quitting: 14.6   Smokeless tobacco: Never   Tobacco comments:    3 cigarettes per day  Vaping Use   Vaping status: Every Day   Substances: Nicotine, Flavoring  Substance and Sexual Activity   Alcohol use: Yes    Comment: occ   Drug use: Not Currently    Frequency: 3.0 times per week    Types: Marijuana, Cocaine    Comment: last time was 2019 for cocaine, none since occ marijuana   Sexual activity: Yes    Partners: Male    Birth control/protection: Surgical    Comment: btl   Other Topics Concern   Not on file  Social History Narrative   Not on file   Social Determinants of Health   Financial Resource Strain: Not on file  Food Insecurity: Low Risk  (07/05/2023)   Received from [facility]   Hunger Vital Sign    Worried About Running Out of Food in the Last Year: Never true    Ran Out of Food in the Last Year: Never true  Transportation Needs: Not on file (07/05/2023)  Physical Activity: Not on file  Stress: Not on file  Social Connections: Not on file  Intimate Partner Violence: Not on file    Family History  Problem Relation Age of Onset   Hypertension Mother    Diabetes type II Mother    Arthritis Mother    Hyperlipidemia Mother    Cancer Father        lung     Allergies  Allergen Reactions   Penicillins Nausea And Vomiting    Has patient had a PCN reaction causing immediate rash, facial/tongue/throat swelling, SOB or lightheadedness with hypotension: No Has patient had a PCN reaction causing severe rash involving mucus membranes or skin necrosis: No Has patient had a PCN reaction that required hospitalization Yes Has patient had a PCN reaction occurring within the last 10 years: No If all of the above answers are "NO", then may proceed with Cephalosporin use.    SVE: blood seen, no lesions, no CMT  PROCEDURE: EMB Consent obtained for the procedure.  A bivalve speculum was placed in the vagina.  The cervix was grasped with a single tooth tenaculum.  Pipelle was inserted and rotated.  Uterus sound to 8cm.  Adequate specimen was obtained and sent to pathology.  All instruments were removed.  Patient did not tolerate the procedure well.  To notify patient of the results.   Patient's last menstrual period was Patient's last menstrual period was 09/19/2023.Marland Kitchen          Sexually active: yes  Review of Systems Alls systems reviewed and are negative.    A:         DUB, menorrhagia, dysmenorrhea history of STIs, history of fibroids                              P:       EMB, pap and cultures were collected today         Spoke with Carmelina Dane and trying to get surgery at least by Monday.            All options were reviewed and the risk benefits alternatives were discussed of each. To begin aygestin to reduce and control bleeding. She is considering the robotic hysterectomy.  Counseled extensively on the procedure including but not limited to what to expect and risks and benefits.  Counseled on postop care and pelvic rest for 10 weeks after the surgery with restricted lifting for 6 weeks after.  Counseled on the benefits of the robotic procedure with faster return to daily activities, improved outcomes, and less risk for complications.  To continue Aygestin until she decides on surgery or not.  A referral for surgery was placed. She would like to have this scheduled.         schedule iv iron treatment for the anemia  Teola Bradley    Acetaminophen 325mg  tablet x 2 given to patient. Lot [id number], exp 07/2025 Patient tolerated acetaminophen & toradol well.

## 2023-09-20 ENCOUNTER — Encounter: Payer: Self-pay | Admitting: Obstetrics and Gynecology

## 2023-09-20 LAB — SURESWAB® ADVANCED VAGINITIS PLUS,TMA
C. trachomatis RNA, TMA: NOT DETECTED
CANDIDA SPECIES: DETECTED — AB
Candida glabrata: NOT DETECTED
N. gonorrhoeae RNA, TMA: NOT DETECTED
SURESWAB(R) ADV BACTERIAL VAGINOSIS(BV),TMA: POSITIVE — AB
TRICHOMONAS VAGINALIS (TV),TMA: NOT DETECTED

## 2023-09-20 NOTE — Telephone Encounter (Signed)
Patient called reviewed surgery instructions: Pick up and fill before surgery all post op medications No heavy lifting 8-10 lbs for 8 weeks. NO INTERCOURSE for 10 WEEKS S/s of UTI and CO2 gas pain, return with heavy bleeding, and when to return Arrive at least 2 hours prior to surgery NPO midnight prior to the procedure She would to stay overnight Can stop all hormones(progesterone) after surgery Have a driver to the hospital and to pick up after No hot tubs or bath for 2 weeks. Showers are fine until then Surgical glue will be on the incisions.  Keep open and dry. Can remove at 2 weeks, if still there. Discussed risks of bleeding, infection, injury to surrounding structures ie bowel bladder and ureters, risk for additional surgery and risk blood products was discussed.  Dr. Karma Greaser

## 2023-09-21 ENCOUNTER — Other Ambulatory Visit: Payer: Self-pay | Admitting: *Deleted

## 2023-09-21 MED ORDER — NUVESSA 1.3 % VA GEL: 1.0000 | Freq: Once | VAGINAL | 0 refills | Status: AC

## 2023-09-21 MED ORDER — FLUCONAZOLE 150 MG PO TABS
ORAL_TABLET | ORAL | 0 refills | Status: DC
Start: 2023-09-21 — End: 2023-10-17

## 2023-09-21 NOTE — H&P (Signed)
PREOP H&P  37 y.o. y.o. female here for pelvic pain, cramping and DUB. Has been to urgent care and ER with no other causes. Here today again with 2 weeks of bleeding.  Left here fat last visit and went for a transfusion 1 unit PRBC at ER hg 8 with her iv iron. She started bleeding again with clots with 6 pads and tampons a day. She lost her aygestin and is not taking it.  She is frustrated with the bleeding and would like a hysterectomy asap. She is also cramping severely today She understands the [facility] is permanent procedure and she will not be able to conceive in the future.  Medicaid form signed. History of tubal ligation  She states she has always had cramping since she started her period. She is now having annoying cramping that sometimes wakes her up 2-3 times a week. She is anemic and is scheduling IV iron She has had STI's in the past. She the cramping starts from the middle or now from the left LQ and radiates to above her pubic bone and across her pelvis. She has not had a regular cycle in July or August. Reports in July she bled Monday through Thursday with pink blood and moderate clots.  Friday she stopped and Saturday she was pouring out blood. She reports that she could not use the tampon but had to use the overnight pads and these were saturated. This month, she reports severe cramps again  and  Blood with wiping She reports she was checked with her PMD for STI recently and was negative. She did mention that one of her providers mentioned a polyp. She does not want any birth control options that make her bleed and she is a smoker She is considering the [facility]  Weight 83.5 kg, last menstrual period 09/19/2023.  2015 TV US ative  CLINICAL DATA:  pelvic cramping  EXAM: TRANSABDOMINAL AND TRANSVAGINAL ULTRASOUND OF PELVIS  TECHNIQUE: Study was performed transabdominally to optimize pelvic field of view evaluation and transvaginally to optimize internal  visceral architecture evaluation.  COMPARISON:  CT abdomen and pelvis December 12, 2005  FINDINGS: Uterus  Measurements: 8.2 x 5.1 x 6.5 cm. No dominant mass is seen by ultrasound. There are scattered 2-3 mm echogenic foci which may represent small calcifications due to leiomyomatous change without well-defined mass. Uterus is retroverted.  Endometrium  Thickness: 4 mm.  No focal abnormality visualized.  Right ovary  Measurements: 3.5 x 1.4 x 2.2 cm. Normal appearance/no adnexal mass.  Left ovary  Measurements: 2.6 x 2.8 x 2.2 cm. Normal appearance/no adnexal mass.  Other findings  There is a small amount of free pelvic fluid.  IMPRESSION: Scattered small calcifications in the uterus which may represent leiomyomatous change. There is no well-defined mass. The overall echotexture of the uterus appears unremarkable. The endometrium is not thickened. Uterus is retroverted.  Small amount of free pelvic fluid may be due to recent ovarian cyst rupture. Note that currently there is no extrauterine pelvic or adnexal mass appreciable.   Electronically Signed   By: Bretta Bang M.D.   On: 07/12/2014 14:36         Component Value Date/Time   DIAGPAP (A) 11/11/2018 0000    ATYPICAL SQUAMOUS CELLS OF UNDETERMINED SIGNIFICANCE (ASC-US).   DIAGPAP TRICHOMONAS VAGINALIS PRESENT. (A) 11/11/2018 0000   ADEQPAP (A) 11/11/2018 0000    Satisfactory for evaluation  endocervical/transformation zone component PRESENT.    GYN HISTORY:    Component Value  Date/Time   DIAGPAP (A) 11/11/2018 0000    ATYPICAL SQUAMOUS CELLS OF UNDETERMINED SIGNIFICANCE (ASC-US).   DIAGPAP TRICHOMONAS VAGINALIS PRESENT. (A) 11/11/2018 0000   ADEQPAP (A) 11/11/2018 0000    Satisfactory for evaluation  endocervical/transformation zone component PRESENT.   H/o STI with treatment OB History  Gravida Para Term Preterm AB Living  5 4 3 1 1 4   SAB IAB Ectopic Multiple Live Births  1 0 0 0 4    #  Outcome Date GA Lbr Len/2nd Weight Sex Type Anes PTL Lv  5 Term 11/29/18 [redacted]w[redacted]d 03:21 / 00:08 2345 g M Vag-Spont None  LIV     Birth Comments: WNL  4 Term 04/27/16 109w0d 01:50 / 00:05 2570 g F Vag-Spont None  LIV  3 Preterm 09/04/06 [redacted]w[redacted]d  1984 g F Vag-Spont   LIV     Birth Comments: baby had gastroscesis  2 Term 05/27/03 [redacted]w[redacted]d  3005 g M Vag-Spont  N LIV  1 SAB             Past Medical History:  Diagnosis Date   Abscess of axilla    many years ago   Anal fissure 2014   Anemia    s/p blood transfusions, 09/05/23 and 2019   Anxiety    Bipolar 1 disorder (HCC)    no meds   Chronic bronchitis (HCC)    Patient has an inhaler to use if needed. Patient states that she rarely uses inhaler. Patient has cut back on smoking.   COVID-19 02/28/2020   LOSS OF TASTE AND SMELL, NOW RESOLVED AFTER QUARANTINE   Depression    bipolar; not on meds   Dyspnea    due to IDA per patient.   Heart murmur    as a child none now   Hemorrhoids    Hypokalemia 06/17/2012   Migraines    Pt has to take a nap when she gets a headache.   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   Substance abuse (HCC)    hx of cocaine abuse, per patient on 09/19/2023 no cocaine since 2019, no longer using marijuana per pt   Suicide attempt (HCC) 06/19/2012   UTI (lower urinary tract infection) 06/19/2012    Past Surgical History:  Procedure Laterality Date   DENTAL SURGERY     2022   HEMORRHOID SURGERY N/A 03/25/2020   Procedure: HEMORRHOIDECTOMY;  Surgeon: Karie Soda, MD;  Location: [facility] [place];  Service: General;  Laterality: N/A;   INCISION AND DRAINAGE PERIRECTAL ABSCESS N/A 01/16/2018   Procedure: IRRIGATION AND DEBRIDEMENT PERIRECTAL ABSCESS;  Surgeon: Rodman Pickle, MD;  Location: [facility];  Service: General;  Laterality: N/A;   LAPAROSCOPIC TUBAL LIGATION Bilateral 02/05/2019   Procedure:  LAPAROSCOPIC TUBAL LIGATION WITH CERVICAL POLYPECTOMY;  Surgeon: Allie Bossier, MD;  Location: [facility];  Service: Gynecology;  Laterality: Bilateral;   RECTAL EXAM UNDER ANESTHESIA N/A 03/25/2020   Procedure: ANORECTAL EXAM UNDER ANESTHESIA, REPAIR OF PERIRECTAL FISTULA;  Surgeon: Karie Soda, MD;  Location: [facility] [place];  Service: General;  Laterality: N/A;    No current facility-administered medications on file prior to encounter.   Current Outpatient Medications on File Prior to Encounter  Medication Sig Dispense Refill   beclomethasone (QVAR) 40 MCG/ACT inhaler Inhale into the lungs as needed.     acetaminophen (TYLENOL 8 HOUR) 650 MG  CR tablet Take 1 tablet (650 mg total) by mouth every 8 (eight) hours as needed for pain. 60 tablet 3   ferrous sulfate 325 (65 FE) MG tablet Take 1 tablet (325 mg total) by mouth daily. 30 tablet 1   ibuprofen (ADVIL) 800 MG tablet Take 1 tablet (800 mg total) by mouth every 8 (eight) hours as needed. 30 tablet 1   ketorolac (TORADOL) 10 MG tablet Take 1 tablet (10 mg total) by mouth every 6 (six) hours as needed. 20 tablet 0   metoCLOPramide (REGLAN) 10 MG tablet Take 1 tablet (10 mg total) by mouth every 6 (six) hours as needed for nausea. 10 tablet 0   norethindrone (AYGESTIN) 5 MG tablet Take 2 tablets (10 mg total) by mouth daily. 30 tablet 0   oxyCODONE (ROXICODONE) 5 MG immediate release tablet Take 1 tablet (5 mg total) by mouth every 4 (four) hours as needed for severe pain. 10 tablet 0   [DISCONTINUED] albuterol (VENTOLIN HFA) 108 (90 Base) MCG/ACT inhaler Inhale 1-2 puffs into the lungs every 6 (six) hours as needed for wheezing or shortness of breath. (Patient taking differently: Inhale 1-2 puffs into the lungs every 6 (six) hours as needed for wheezing or shortness of breath. Out of inhaler) 6.7 g 1    Social History   Socioeconomic History   Marital status: Single    Spouse name: Not on file   Number of  children: Not on file   Years of education: Not on file   Highest education level: Not on file  Occupational History   Not on file  Tobacco Use   Smoking status: Every Day    Current packs/day: 0.00    Types: Cigarettes    Last attempt to quit: 01/14/2009    Years since quitting: 14.6   Smokeless tobacco: Never   Tobacco comments:    Per patient, she only smokes 1 - 1 1/2 cigarettes per day. Rarely vapes as of 09/19/2023.  Vaping Use   Vaping status: Some Days   Substances: Nicotine, Flavoring  Substance and Sexual Activity   Alcohol use: Yes    Comment: about a pint of liquor per month   Drug use: Not Currently    Types: Marijuana, Cocaine    Comment: last time was 2019 for cocaine   Sexual activity: Yes    Partners: Male    Birth control/protection: Surgical    Comment: btl  Other Topics Concern   Not on file  Social History Narrative   Not on file   Social Determinants of Health   Financial Resource Strain: Not on file  Food Insecurity: Low Risk  (07/05/2023)   Received from [facility]   Hunger Vital Sign    Worried About Running Out of Food in the Last Year: Never true    Ran Out of Food in the Last Year: Never true  Transportation Needs: Not on file (07/05/2023)  Physical Activity: Not on file  Stress: Not on file  Social Connections: Not on file  Intimate Partner Violence: Not on file    Family History  Problem Relation Age of Onset   Hypertension Mother    Diabetes type II Mother    Arthritis Mother    Hyperlipidemia Mother    Cancer Father        lung     Allergies  Allergen Reactions   Amoxicillin Nausea And Vomiting    Has patient had a PCN reaction causing immediate rash, facial/tongue/throat swelling, SOB  or lightheadedness with hypotension: No Has patient had a PCN reaction causing severe rash involving mucus membranes or skin necrosis: No Has patient had a PCN reaction that required hospitalization Yes Has patient had a PCN reaction  occurring within the last 10 years: Yes If all of the above answers are "NO", then may proceed with Cephalosporin use.   Penicillins Nausea And Vomiting    Has patient had a PCN reaction causing immediate rash, facial/tongue/throat swelling, SOB or lightheadedness with hypotension: No Has patient had a PCN reaction causing severe rash involving mucus membranes or skin necrosis: No Has patient had a PCN reaction that required hospitalization Yes Has patient had a PCN reaction occurring within the last 10 years: No If all of the above answers are "NO", then may proceed with Cephalosporin use.    SVE: blood seen, no lesions, no CMT  PROCEDURE: EMB Consent obtained for the procedure.  A bivalve speculum was placed in the vagina.  The cervix was grasped with a single tooth tenaculum.  Pipelle was inserted and rotated.  Uterus sound to 8cm.  Adequate specimen was obtained and sent to pathology.  All instruments were removed.  Patient did not tolerate the procedure well.  To notify patient of the results.   Patient's last menstrual period was Patient's last menstrual period was 09/19/2023.Marland Kitchen          Sexually active: yes     Review of Systems Alls systems reviewed and are negative.    A:         DUB, menorrhagia, dysmenorrhea history of STIs, history of fibroids                             P:       EMB, pap and cultures were collected today         Spoke with Carmelina Dane and trying to get surgery at least by Monday.            All options were reviewed and the risk benefits alternatives were discussed of each. To begin aygestin to reduce and control bleeding. She is considering the robotic hysterectomy.  Counseled extensively on the procedure including but not limited to what to expect and risks and benefits.  Counseled on postop care and pelvic rest for 10 weeks after the surgery with restricted lifting for 6 weeks after.  Counseled on the benefits of the robotic procedure with faster return to  daily activities, improved outcomes, and less risk for complications.  To continue Aygestin until she decides on surgery or not.  A referral for surgery was placed. She would like to have this scheduled.         schedule iv iron treatment for the anemia  Teola Bradley    Acetaminophen 325mg  tablet x 2 given to patient. Lot [id number], exp 07/2025 Patient tolerated acetaminophen & toradol well.

## 2023-09-24 ENCOUNTER — Ambulatory Visit (HOSPITAL_BASED_OUTPATIENT_CLINIC_OR_DEPARTMENT_OTHER): Payer: Medicaid Other | Admitting: Certified Registered"

## 2023-09-24 ENCOUNTER — Ambulatory Visit (HOSPITAL_BASED_OUTPATIENT_CLINIC_OR_DEPARTMENT_OTHER)
Admission: RE | Admit: 2023-09-24 | Discharge: 2023-09-25 | Disposition: A | Discharge status: Home / Self Care | Payer: Medicaid Other | Attending: Obstetrics and Gynecology | Admitting: Obstetrics and Gynecology

## 2023-09-24 ENCOUNTER — Other Ambulatory Visit: Payer: Self-pay

## 2023-09-24 ENCOUNTER — Encounter (HOSPITAL_BASED_OUTPATIENT_CLINIC_OR_DEPARTMENT_OTHER)
Admission: RE | Disposition: A | Discharge status: Home / Self Care | Payer: Self-pay | Source: Home / Self Care | Attending: Obstetrics and Gynecology

## 2023-09-24 ENCOUNTER — Encounter (HOSPITAL_BASED_OUTPATIENT_CLINIC_OR_DEPARTMENT_OTHER): Payer: Self-pay | Admitting: Obstetrics and Gynecology

## 2023-09-24 DIAGNOSIS — D500 Iron deficiency anemia secondary to blood loss (chronic): Secondary | ICD-10-CM | POA: Insufficient documentation

## 2023-09-24 DIAGNOSIS — Z01818 Encounter for other preprocedural examination: Secondary | ICD-10-CM

## 2023-09-24 DIAGNOSIS — F17200 Nicotine dependence, unspecified, uncomplicated: Secondary | ICD-10-CM | POA: Insufficient documentation

## 2023-09-24 DIAGNOSIS — N920 Excessive and frequent menstruation with regular cycle: Secondary | ICD-10-CM | POA: Diagnosis present

## 2023-09-24 DIAGNOSIS — N921 Excessive and frequent menstruation with irregular cycle: Secondary | ICD-10-CM

## 2023-09-24 DIAGNOSIS — Z7951 Long term (current) use of inhaled steroids: Secondary | ICD-10-CM | POA: Diagnosis not present

## 2023-09-24 DIAGNOSIS — D259 Leiomyoma of uterus, unspecified: Secondary | ICD-10-CM | POA: Insufficient documentation

## 2023-09-24 DIAGNOSIS — N838 Other noninflammatory disorders of ovary, fallopian tube and broad ligament: Secondary | ICD-10-CM | POA: Insufficient documentation

## 2023-09-24 HISTORY — PX: ROBOTIC ASSISTED LAPAROSCOPIC HYSTERECTOMY AND SALPINGECTOMY: SHX6379

## 2023-09-24 HISTORY — PX: CYSTOSCOPY: SHX5120

## 2023-09-24 HISTORY — DX: Dyspnea, unspecified: R06.00

## 2023-09-24 LAB — TYPE AND SCREEN
ABO/RH(D): O POS
Antibody Screen: NEGATIVE

## 2023-09-24 LAB — CBC
HCT: 30.9 % — ABNORMAL LOW (ref 36.0–46.0)
Hemoglobin: 9 g/dL — ABNORMAL LOW (ref 12.0–15.0)
MCH: 18.6 pg — ABNORMAL LOW (ref 26.0–34.0)
MCHC: 29.1 g/dL — ABNORMAL LOW (ref 30.0–36.0)
MCV: 63.7 fL — ABNORMAL LOW (ref 80.0–100.0)
Platelets: 387 10*3/uL (ref 150–400)
RBC: 4.85 MIL/uL (ref 3.87–5.11)
RDW: 24.3 % — ABNORMAL HIGH (ref 11.5–15.5)
WBC: 7.3 10*3/uL (ref 4.0–10.5)
nRBC: 0 % (ref 0.0–0.2)

## 2023-09-24 LAB — CYTOLOGY - PAP
Comment: NEGATIVE
Diagnosis: NEGATIVE
High risk HPV: NEGATIVE

## 2023-09-24 LAB — POCT PREGNANCY, URINE: Preg Test, Ur: NEGATIVE

## 2023-09-24 LAB — SURGICAL PATHOLOGY

## 2023-09-24 SURGERY — XI ROBOTIC ASSISTED LAPAROSCOPIC HYSTERECTOMY AND SALPINGECTOMY
Anesthesia: General

## 2023-09-24 MED ORDER — KETOROLAC TROMETHAMINE 30 MG/ML IJ SOLN
INTRAMUSCULAR | Status: AC
  Filled 2023-09-24: qty 1

## 2023-09-24 MED ORDER — ROCURONIUM BROMIDE 10 MG/ML (PF) SYRINGE
PREFILLED_SYRINGE | INTRAVENOUS | Status: DC | PRN
  Administered 2023-09-24: 70 mg via INTRAVENOUS

## 2023-09-24 MED ORDER — OXYCODONE HCL 5 MG PO TABS
5.0000 mg | ORAL_TABLET | ORAL | Status: DC | PRN
  Administered 2023-09-24 – 2023-09-25 (×6): 5 mg via ORAL

## 2023-09-24 MED ORDER — KETOROLAC TROMETHAMINE 30 MG/ML IJ SOLN
INTRAMUSCULAR | Status: DC | PRN
Start: 2023-09-24 — End: 2023-09-24
  Administered 2023-09-24: 30 mg via INTRAVENOUS

## 2023-09-24 MED ORDER — ONDANSETRON HCL 4 MG PO TABS: 4.0000 mg | ORAL_TABLET | Freq: Four times a day (QID) | ORAL | Status: DC | PRN

## 2023-09-24 MED ORDER — PANTOPRAZOLE SODIUM 40 MG IV SOLR
INTRAVENOUS | Status: AC
  Filled 2023-09-24: qty 10

## 2023-09-24 MED ORDER — GABAPENTIN 100 MG PO CAPS
ORAL_CAPSULE | ORAL | Status: AC
  Filled 2023-09-24: qty 1

## 2023-09-24 MED ORDER — PROPOFOL 10 MG/ML IV BOLUS
INTRAVENOUS | Status: AC
  Filled 2023-09-24: qty 20

## 2023-09-24 MED ORDER — LACTATED RINGERS IV SOLN: INTRAVENOUS | Status: DC

## 2023-09-24 MED ORDER — GABAPENTIN 100 MG PO CAPS
200.0000 mg | ORAL_CAPSULE | Freq: Four times a day (QID) | ORAL | Status: DC
  Administered 2023-09-24 – 2023-09-25 (×2): 200 mg via ORAL

## 2023-09-24 MED ORDER — OXYCODONE HCL 5 MG PO TABS
ORAL_TABLET | ORAL | Status: AC
  Filled 2023-09-24: qty 1

## 2023-09-24 MED ORDER — FENTANYL CITRATE (PF) 100 MCG/2ML IJ SOLN
INTRAMUSCULAR | Status: DC | PRN
  Administered 2023-09-24: 100 ug via INTRAVENOUS

## 2023-09-24 MED ORDER — GABAPENTIN 100 MG PO CAPS
ORAL_CAPSULE | ORAL | Status: AC
  Filled 2023-09-24: qty 2

## 2023-09-24 MED ORDER — ACETAMINOPHEN 325 MG PO TABS
650.0000 mg | ORAL_TABLET | ORAL | Status: DC | PRN
  Administered 2023-09-24: 650 mg via ORAL

## 2023-09-24 MED ORDER — MIDAZOLAM HCL 5 MG/5ML IJ SOLN
INTRAMUSCULAR | Status: DC | PRN
  Administered 2023-09-24: 2 mg via INTRAVENOUS

## 2023-09-24 MED ORDER — GABAPENTIN 300 MG PO CAPS
ORAL_CAPSULE | ORAL | Status: AC
  Filled 2023-09-24: qty 1

## 2023-09-24 MED ORDER — DOCUSATE SODIUM 100 MG PO CAPS
100.0000 mg | ORAL_CAPSULE | Freq: Two times a day (BID) | ORAL | Status: DC
  Administered 2023-09-24 (×2): 100 mg via ORAL

## 2023-09-24 MED ORDER — DROPERIDOL 2.5 MG/ML IJ SOLN: 0.6250 mg | Freq: Once | INTRAMUSCULAR | Status: DC | PRN

## 2023-09-24 MED ORDER — ROCURONIUM BROMIDE 10 MG/ML (PF) SYRINGE
PREFILLED_SYRINGE | INTRAVENOUS | Status: AC
  Filled 2023-09-24: qty 10

## 2023-09-24 MED ORDER — OXYCODONE HCL 5 MG/5ML PO SOLN: 5.0000 mg | Freq: Once | ORAL | Status: DC | PRN

## 2023-09-24 MED ORDER — DEXAMETHASONE SODIUM PHOSPHATE 10 MG/ML IJ SOLN
INTRAMUSCULAR | Status: DC | PRN
  Administered 2023-09-24: 10 mg via INTRAVENOUS

## 2023-09-24 MED ORDER — MAGNESIUM HYDROXIDE 400 MG/5ML PO SUSP
30.0000 mL | Freq: Every day | ORAL | Status: DC | PRN
  Filled 2023-09-24: qty 30

## 2023-09-24 MED ORDER — LIDOCAINE 2% (20 MG/ML) 5 ML SYRINGE
INTRAMUSCULAR | Status: DC | PRN
  Administered 2023-09-24: 80 mg via INTRAVENOUS

## 2023-09-24 MED ORDER — DEXAMETHASONE SODIUM PHOSPHATE 10 MG/ML IJ SOLN
INTRAMUSCULAR | Status: AC
  Filled 2023-09-24: qty 1

## 2023-09-24 MED ORDER — SODIUM CHLORIDE 0.9 % IV SOLN
INTRAVENOUS | Status: DC | PRN
  Administered 2023-09-24: 400 mL
  Administered 2023-09-24: 600 mL

## 2023-09-24 MED ORDER — SUGAMMADEX SODIUM 200 MG/2ML IV SOLN
INTRAVENOUS | Status: DC | PRN
  Administered 2023-09-24: 200 mg via INTRAVENOUS

## 2023-09-24 MED ORDER — IBUPROFEN 200 MG PO TABS
ORAL_TABLET | ORAL | Status: AC
  Filled 2023-09-24: qty 3

## 2023-09-24 MED ORDER — PROPOFOL 10 MG/ML IV BOLUS
INTRAVENOUS | Status: DC | PRN
  Administered 2023-09-24: 200 mg via INTRAVENOUS

## 2023-09-24 MED ORDER — LIDOCAINE HCL (PF) 2 % IJ SOLN
INTRAMUSCULAR | Status: AC
  Filled 2023-09-24: qty 5

## 2023-09-24 MED ORDER — GABAPENTIN 300 MG PO CAPS
300.0000 mg | ORAL_CAPSULE | ORAL | Status: AC
  Administered 2023-09-24: 300 mg via ORAL

## 2023-09-24 MED ORDER — ACETAMINOPHEN 500 MG PO TABS
ORAL_TABLET | ORAL | Status: AC
  Filled 2023-09-24: qty 2

## 2023-09-24 MED ORDER — BUPIVACAINE HCL (PF) 0.5 % IJ SOLN
INTRAMUSCULAR | Status: DC | PRN
  Administered 2023-09-24: 12 mL

## 2023-09-24 MED ORDER — GABAPENTIN 100 MG PO CAPS
100.0000 mg | ORAL_CAPSULE | Freq: Once | ORAL | Status: AC
  Administered 2023-09-24: 100 mg via ORAL

## 2023-09-24 MED ORDER — FENTANYL CITRATE (PF) 100 MCG/2ML IJ SOLN
INTRAMUSCULAR | Status: AC
  Filled 2023-09-24: qty 2

## 2023-09-24 MED ORDER — ONDANSETRON HCL 4 MG/2ML IJ SOLN: 4.0000 mg | Freq: Four times a day (QID) | INTRAMUSCULAR | Status: DC | PRN

## 2023-09-24 MED ORDER — OXYCODONE HCL 5 MG PO TABS: 5.0000 mg | ORAL_TABLET | Freq: Once | ORAL | Status: DC | PRN

## 2023-09-24 MED ORDER — GENTAMICIN SULFATE 40 MG/ML IJ SOLN
5.0000 mg/kg | INTRAVENOUS | Status: AC
  Administered 2023-09-24: 420 mg via INTRAVENOUS
  Filled 2023-09-24: qty 10.5

## 2023-09-24 MED ORDER — POVIDONE-IODINE 10 % EX SWAB: 2.0000 | Freq: Once | CUTANEOUS | Status: DC

## 2023-09-24 MED ORDER — FENTANYL CITRATE (PF) 100 MCG/2ML IJ SOLN
25.0000 ug | INTRAMUSCULAR | Status: DC | PRN
  Administered 2023-09-24 (×4): 25 ug via INTRAVENOUS

## 2023-09-24 MED ORDER — ACETAMINOPHEN 500 MG PO TABS
1000.0000 mg | ORAL_TABLET | ORAL | Status: AC
  Administered 2023-09-24: 1000 mg via ORAL

## 2023-09-24 MED ORDER — LACTATED RINGERS IV BOLUS
500.0000 mL | Freq: Once | INTRAVENOUS | Status: AC
  Administered 2023-09-24: 500 mL via INTRAVENOUS

## 2023-09-24 MED ORDER — DOCUSATE SODIUM 100 MG PO CAPS
ORAL_CAPSULE | ORAL | Status: AC
  Filled 2023-09-24: qty 1

## 2023-09-24 MED ORDER — IBUPROFEN 200 MG PO TABS
600.0000 mg | ORAL_TABLET | Freq: Four times a day (QID) | ORAL | Status: DC
  Administered 2023-09-24 – 2023-09-25 (×3): 600 mg via ORAL

## 2023-09-24 MED ORDER — CEFAZOLIN SODIUM 1 G IJ SOLR
INTRAMUSCULAR | Status: AC
  Filled 2023-09-24: qty 10

## 2023-09-24 MED ORDER — CLINDAMYCIN PHOSPHATE 900 MG/50ML IV SOLN
INTRAVENOUS | Status: AC
  Filled 2023-09-24: qty 50

## 2023-09-24 MED ORDER — DEXMEDETOMIDINE HCL IN NACL 80 MCG/20ML IV SOLN
INTRAVENOUS | Status: DC | PRN
  Administered 2023-09-24 (×5): 4 ug via INTRAVENOUS

## 2023-09-24 MED ORDER — ACETAMINOPHEN 325 MG PO TABS
ORAL_TABLET | ORAL | Status: AC
  Filled 2023-09-24: qty 2

## 2023-09-24 MED ORDER — HYDROMORPHONE HCL 1 MG/ML IJ SOLN: 0.2000 mg | INTRAMUSCULAR | Status: DC | PRN

## 2023-09-24 MED ORDER — ONDANSETRON HCL 4 MG/2ML IJ SOLN
INTRAMUSCULAR | Status: DC | PRN
  Administered 2023-09-24: 4 mg via INTRAVENOUS

## 2023-09-24 MED ORDER — KETOROLAC TROMETHAMINE 30 MG/ML IJ SOLN: 30.0000 mg | Freq: Once | INTRAMUSCULAR | Status: DC

## 2023-09-24 MED ORDER — PANTOPRAZOLE SODIUM 40 MG IV SOLR
40.0000 mg | Freq: Every day | INTRAVENOUS | Status: DC
  Administered 2023-09-24: 40 mg via INTRAVENOUS

## 2023-09-24 MED ORDER — CLINDAMYCIN PHOSPHATE 900 MG/50ML IV SOLN
900.0000 mg | INTRAVENOUS | Status: AC
  Administered 2023-09-24: 900 mg via INTRAVENOUS

## 2023-09-24 MED ORDER — MIDAZOLAM HCL 2 MG/2ML IJ SOLN
INTRAMUSCULAR | Status: AC
  Filled 2023-09-24: qty 2

## 2023-09-24 MED ORDER — ONDANSETRON HCL 4 MG/2ML IJ SOLN
INTRAMUSCULAR | Status: AC
  Filled 2023-09-24: qty 2

## 2023-09-24 SURGICAL SUPPLY — 45 items
ADH SKN CLS APL DERMABOND .7 (GAUZE/BANDAGES/DRESSINGS) ×2
CANNULA CAP OBTURATR AIRSEAL 8 (CAP) IMPLANT
COVER BACK TABLE 60X90IN (DRAPES) ×3 IMPLANT
COVER TIP SHEARS 8 DVNC (MISCELLANEOUS) ×3 IMPLANT
DEFOGGER SCOPE WARMER CLEARIFY (MISCELLANEOUS) ×3 IMPLANT
DERMABOND ADVANCED .7 DNX12 (GAUZE/BANDAGES/DRESSINGS) ×3 IMPLANT
DRAPE ARM DVNC X/XI (DISPOSABLE) ×12 IMPLANT
DRAPE COLUMN DVNC XI (DISPOSABLE) ×3 IMPLANT
DRAPE UTILITY XL STRL (DRAPES) ×3 IMPLANT
DRIVER NDL MEGA SUTCUT DVNCXI (INSTRUMENTS) ×3 IMPLANT
DRIVER NDLE MEGA SUTCUT DVNCXI (INSTRUMENTS) ×2
ELECT REM PT RETURN 9FT ADLT (ELECTROSURGICAL) ×2
ELECTRODE REM PT RTRN 9FT ADLT (ELECTROSURGICAL) ×3 IMPLANT
FORCEPS PROGRASP DVNC XI (FORCEP) ×3 IMPLANT
GAUZE 4X4 16PLY ~~LOC~~+RFID DBL (SPONGE) ×6 IMPLANT
GLOVE BIOGEL PI IND STRL 7.0 (GLOVE) ×6 IMPLANT
GLOVE NEODERM STER SZ 7 (GLOVE) ×6 IMPLANT
HOLDER FOLEY CATH W/STRAP (MISCELLANEOUS) ×3 IMPLANT
IRRIG SUCT STRYKERFLOW 2 WTIP (MISCELLANEOUS) ×2
IRRIGATION SUCT STRKRFLW 2 WTP (MISCELLANEOUS) ×3 IMPLANT
KIT PINK PAD W/HEAD ARE REST (MISCELLANEOUS) ×2
KIT PINK PAD W/HEAD ARM REST (MISCELLANEOUS) ×3 IMPLANT
KIT TURNOVER CYSTO (KITS) ×3 IMPLANT
LEGGING LITHOTOMY PAIR STRL (DRAPES) ×3 IMPLANT
MANIFOLD NEPTUNE II (INSTRUMENTS) ×3 IMPLANT
OBTURATOR OPTICAL STND 8 DVNC (TROCAR) ×2
OBTURATOR OPTICALSTD 8 DVNC (TROCAR) ×3 IMPLANT
OCCLUDER COLPOPNEUMO (BALLOONS) ×3 IMPLANT
PACK ROBOT WH (CUSTOM PROCEDURE TRAY) ×3 IMPLANT
PACK ROBOTIC GOWN (GOWN DISPOSABLE) ×15 IMPLANT
PAD OB MATERNITY 4.3X12.25 (PERSONAL CARE ITEMS) ×3 IMPLANT
SCISSORS MNPLR CVD DVNC XI (INSTRUMENTS) ×3 IMPLANT
SEAL UNIV 5-12 XI (MISCELLANEOUS) ×9 IMPLANT
SET CYSTO W/LG BORE CLAMP LF (SET/KITS/TRAYS/PACK) ×3 IMPLANT
SET IRRIG Y TYPE TUR BLADDER L (SET/KITS/TRAYS/PACK) IMPLANT
SET TUBE FILTERED XL AIRSEAL (SET/KITS/TRAYS/PACK) IMPLANT
SUT MNCRL AB 4-0 PS2 18 (SUTURE) ×3 IMPLANT
SUT VIC AB 0 CT1 27 (SUTURE) ×2
SUT VIC AB 0 CT1 27XBRD ANBCTR (SUTURE) IMPLANT
SUT VIC AB 4-0 PS2 18 (SUTURE) IMPLANT
SUT VLOC 180 0 9IN GS21 (SUTURE) ×3 IMPLANT
TIP UTERINE 6.7X8CM BLUE DISP (MISCELLANEOUS) IMPLANT
TOWEL OR 17X26 10 PK STRL BLUE (TOWEL DISPOSABLE) ×3 IMPLANT
TRAY FOLEY W/BAG SLVR 14FR (SET/KITS/TRAYS/PACK) ×3 IMPLANT
UNDERPAD 30X36 HEAVY ABSORB (UNDERPADS AND DIAPERS) ×3 IMPLANT

## 2023-09-24 NOTE — Progress Notes (Signed)
GYN PO NOTE  Patient voiding 300cc and ambulating. Sore, no VB no n/v  Blood pressure (!) 104/58, pulse 67, temperature 98.3 F (36.8 C), resp. rate 16, height 5' 6.75" (1.695 m), weight 83.5 kg, last menstrual period 09/19/2023, SpO2 100%.  Hold on h/h now EBL in OR <10cc Hypotension earlier.  Abdomen: soft, incisions I/c/d  A/p RLH, b/l salpingectomy, cystoscopy  Discharge in AM Cancer h/h Continue close care Dr. Karma Greaser

## 2023-09-24 NOTE — Anesthesia Procedure Notes (Signed)
Procedure Name: Intubation Date/Time: 09/24/2023 12:28 PM  Performed by: Ellene Bloodsaw D, CRNAPre-anesthesia Checklist: Patient identified, Emergency Drugs available, Suction available and Patient being monitored Patient Re-evaluated:Patient Re-evaluated prior to induction Oxygen Delivery Method: Circle system utilized Preoxygenation: Pre-oxygenation with 100% oxygen Induction Type: IV induction Ventilation: Mask ventilation without difficulty Laryngoscope Size: Mac and 3 Grade View: Grade I Tube type: Oral Tube size: 7.0 mm Number of attempts: 1 Airway Equipment and Method: Stylet and Oral airway Placement Confirmation: ETT inserted through vocal cords under direct vision, positive ETCO2 and breath sounds checked- equal and bilateral Secured at: 21 cm Tube secured with: Tape Dental Injury: Teeth and Oropharynx as per pre-operative assessment

## 2023-09-24 NOTE — Transfer of Care (Signed)
Immediate Anesthesia Transfer of Care Note  Patient: [name] [name]  Procedure(s) Performed: XI ROBOTIC ASSISTED LAPAROSCOPIC HYSTERECTOMY AND SALPINGECTOMY (Bilateral) CYSTOSCOPY  Patient Location: PACU  Anesthesia Type:General  Level of Consciousness: awake, oriented, and sedated  Airway & Oxygen Therapy: Patient Spontanous Breathing and Patient connected to nasal cannula oxygen  Post-op Assessment: Report given to RN and Post -op Vital signs reviewed and stable  Post vital signs: Reviewed and stable  Last Vitals:  Vitals Value Taken Time  BP 92/61 09/24/23 1339  Temp 36.7 C 09/24/23 1340  Pulse 61 09/24/23 1343  Resp 17 09/24/23 1343  SpO2 100 % 09/24/23 1343  Vitals shown include unfiled device data.  Last Pain:  Vitals:   09/24/23 1118  TempSrc: Oral  PainSc: 0-No pain      Patients Stated Pain Goal: 4 (09/24/23 1118)  Complications: No notable events documented.

## 2023-09-24 NOTE — Anesthesia Postprocedure Evaluation (Signed)
Anesthesia Post Note  Patient: [name] [name]  Procedure(s) Performed: XI ROBOTIC ASSISTED LAPAROSCOPIC HYSTERECTOMY AND SALPINGECTOMY (Bilateral) CYSTOSCOPY     Patient location during evaluation: PACU Anesthesia Type: General Level of consciousness: awake and alert Pain management: pain level controlled Vital Signs Assessment: post-procedure vital signs reviewed and stable Respiratory status: spontaneous breathing, nonlabored ventilation, respiratory function stable and patient connected to nasal cannula oxygen Cardiovascular status: blood pressure returned to baseline and stable Postop Assessment: no apparent nausea or vomiting Anesthetic complications: no   No notable events documented.  Last Vitals:  Vitals:   09/24/23 1658 09/24/23 2035  BP: (!) 104/58 122/70  Pulse:  63  Resp:  16  Temp:  36.8 C  SpO2:  99%    Last Pain:  Vitals:   09/24/23 2039  TempSrc:   PainSc: Asleep                 Nelle Don Elizeth Weinrich

## 2023-09-24 NOTE — Interval H&P Note (Signed)
History and Physical Interval Note:  09/24/2023 12:06 PM  [name] [name]  has presented today for surgery, with the diagnosis of menorrhagia with irregular cycle, iron deficiency anemia due to chronic blood loss.  The various methods of treatment have been discussed with the patient and family. After consideration of risks, benefits and other options for treatment, the patient has consented to  Procedure(s): XI ROBOTIC ASSISTED LAPAROSCOPIC HYSTERECTOMY AND SALPINGECTOMY (Bilateral) CYSTOSCOPY (N/A) as a surgical intervention.  The patient's history has been reviewed, patient examined, no change in status, stable for surgery.  I have reviewed the patient's chart and labs.  Questions were answered to the patient's satisfaction.     Earley Favor

## 2023-09-24 NOTE — Anesthesia Preprocedure Evaluation (Addendum)
Anesthesia Evaluation  Patient identified by MRN, date of birth, ID band Patient awake    Reviewed: Allergy & Precautions, NPO status , Patient's Chart, lab work & pertinent test results  Airway Mallampati: II  TM Distance: >3 FB Neck ROM: Full    Dental   Pulmonary Current Smoker and Patient abstained from smoking.   Pulmonary exam normal        Cardiovascular negative cardio ROS  Rhythm:Regular Rate:Normal     Neuro/Psych  Headaches  Anxiety Depression Bipolar Disorder      GI/Hepatic negative GI ROS, Neg liver ROS,,,  Endo/Other    Renal/GU negative Renal ROS  Female GU complaint     Musculoskeletal negative musculoskeletal ROS (+)    Abdominal Normal abdominal exam  (+)   Peds  Hematology  (+) Blood dyscrasia, anemia Lab Results      Component                Value               Date                      WBC                      7.6                 09/19/2023                HGB                      9.0 (L)             09/19/2023                HCT                      33.9 (L)            09/19/2023                MCV                      68.1 (L)            09/19/2023                PLT                      513 (H)             09/19/2023              Anesthesia Other Findings   Reproductive/Obstetrics                             Anesthesia Physical Anesthesia Plan  ASA: 2  Anesthesia Plan: General   Post-op Pain Management: Gabapentin PO (pre-op)*   Induction: Intravenous  PONV Risk Score and Plan: 2 and Ondansetron, Dexamethasone, Midazolam and Treatment may vary due to age or medical condition  Airway Management Planned: Mask and Oral ETT  Additional Equipment: None  Intra-op Plan:   Post-operative Plan: Extubation in OR  Informed Consent: I have reviewed the patients History and Physical, chart, labs and discussed the procedure including the risks, benefits and  alternatives for the proposed anesthesia with the patient or authorized representative who has indicated his/her understanding  and acceptance.     Dental advisory given  Plan Discussed with: CRNA  Anesthesia Plan Comments:        Anesthesia Quick Evaluation

## 2023-09-25 ENCOUNTER — Telehealth: Payer: Self-pay | Admitting: *Deleted

## 2023-09-25 ENCOUNTER — Encounter (HOSPITAL_BASED_OUTPATIENT_CLINIC_OR_DEPARTMENT_OTHER): Payer: Self-pay | Admitting: Obstetrics and Gynecology

## 2023-09-25 DIAGNOSIS — N921 Excessive and frequent menstruation with irregular cycle: Secondary | ICD-10-CM | POA: Diagnosis not present

## 2023-09-25 DIAGNOSIS — N920 Excessive and frequent menstruation with regular cycle: Secondary | ICD-10-CM | POA: Diagnosis not present

## 2023-09-25 MED ORDER — IBUPROFEN 200 MG PO TABS
ORAL_TABLET | ORAL | Status: AC
  Filled 2023-09-25: qty 3

## 2023-09-25 MED ORDER — OXYCODONE HCL 5 MG PO TABS
ORAL_TABLET | ORAL | Status: AC
  Filled 2023-09-25: qty 1

## 2023-09-25 MED ORDER — SIMETHICONE 125 MG PO CAPS: 1.0000 | ORAL_CAPSULE | Freq: Two times a day (BID) | ORAL | 0 refills | Status: AC | PRN

## 2023-09-25 MED ORDER — GABAPENTIN 100 MG PO CAPS: 100.0000 mg | ORAL_CAPSULE | Freq: Three times a day (TID) | ORAL | 0 refills | Status: DC | PRN

## 2023-09-25 MED ORDER — GABAPENTIN 100 MG PO CAPS
ORAL_CAPSULE | ORAL | Status: AC
  Filled 2023-09-25: qty 2

## 2023-09-25 MED ORDER — IBUPROFEN 800 MG PO TABS: 800.0000 mg | ORAL_TABLET | Freq: Three times a day (TID) | ORAL | 1 refills | Status: DC | PRN

## 2023-09-25 NOTE — Op Note (Signed)
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 Allayne Butcher Assistant: Donne Hazel, RN  Fluids: please see anesthesia report  Complications: None Anesthesia: General   Findings:  boggy contour 7cm uterus, trapped blood in the fallopian tubes from fallopian tube filshie clips.   Cystoscopy at the end of the case with normal bladder and patent ureters bilaterally.  Estimated blood loss: Minimal <10cc  Specimens: Uterus, tubes and cervix  Disposition of specimen: Pathology      Patient is taken to the operating room. She is placed in the supine position. She is a running IV in place. Informed consent was present on the chart. SCDs on her lower extremities and functioning properly. Patient was positioned while she was awake.  Her legs were placed in the low lithotomy position in [place] stirrups. Her arms were tucked by the side.  General endotracheal anesthesia was administered by the anesthesia staff without difficulty.      Clora prep was then used to prep the abdomen and Hibiclens was used to prep the inner thighs, perineum and vagina. Once 3 minutes had past the patient was draped in a normal standard fashion. A proper time out was performed and everyone agreed.  The legs were lifted to the high lithotomy position. A bivalve speculum was inserted into the vagina and the anterior lip of the cervix was grasped with single-tooth tenaculum.  The cervix sounded to 8 cm. Pratt dilators were used to dilate the cervix.  The RUMI uterine manipulator was obtained inserted into the endometrial cavity and the bulb of the disposable tip was inflated with 8 cc  of normal saline. There was a good fit of the KOH ring around the cervix. The tenaculum and bivavle speculum was removed. There is also good manipulation of the uterus.  A Foley catheter was placed to straight drain.  Clear urine was noted. Legs were lowered to the low lithotomy position and attention was turned the abdomen.  Superior to the umbilicus, marcaine 0.25% used to anesthetize the skin.  Using #11 blade, 8mm skin incision was made.  The 8mm robotic trocar and sleeve was inserted under direct visualization.  CO2 gas was  started and patient was placed in trendelenburg position.  Two additional 8mm ports were placed under direct visualization in the left and right lower quadrant.    Ureters were identifies.  Attention was turned to the left side. With uterus on stretch the left tube was excised off the ovary and mesosalpinx was dissected to free the tube with close attention the IP ligament and blood supply to the ovaries on both sides. Then left uterine ovarian pedicle was serially clamped cauterized and incised. Left round ligament was serially clamped cauterized and incised. The anterior and posterior peritoneum of the inferior leaf of the broad ligament were opened. The beginning of the bladder flap was created.  The bladder was taken down below the level of the KOH ring. The left uterine artery skeletonized and then just superior to the KOH ring this vessel was serially clamped, cauterized, and incised.  Attention was turned the right side.  The uterus was placed on stretch to the opposite side.    The mesosalpinx was incised freeing the tube. Then the right uterine ovarian  pedicle was serially clamped cauterized and incised. Next the right round ligament was serially clamped cauterized and incised. The anterior posterior peritoneum of the inferiorly for the broad ligament were opened. The anterior peritoneum was carried across to the dissection on the left side. The remainder of the bladder flap  was created using sharp dissection. The bladder was well below the level of the KOH ring. The right uterine artery skeletonized. Then the right uterine artery, above the level of the KOH ring, was serially clamped cauterized and incised. The uterus was devascularized at this point.  The colpotomy was performed.  This was carried around a circumferential fashion until the vaginal mucosa was completely incised in the specimen was freed.  The specimen was then delivered to the vagina.  A vaginal occlusive device was used to maintain the pneumoperitoneum  Instruments were changed with a needle driver and prograsp.  Using a 9 iV. [place] V-lock suture, the cuff was closed by incorporating the anterior and posterior vaginal mucosa in each stitch. This was carried across all the way to the left corner and a running fashion. Two stitches were brought back towards the midline and the suture was cut flush with the vagina. The needle was brought out the pelvis. The pelvis was irrigated. All pedicles were inspected. No bleeding was noted.   Co2 pressures were lowered to 8mm Hg.  Again, no bleeding was noted.  Ureters were noted deep in the pelvis to be peristalsing.  At this point the procedure was completed.  The remaining instruments were removed.  The ports were removed under direct visualization of the laparoscope and the pneumoperitoneum was relieved.  The skin was then closed with subcuticular stitches of 3-0 Vicryl. The skin was cleansed Dermabond was applied. Attention was then turned the vagina and the cuff was inspected. No bleeding was noted.  The Foley catheter was removed.  Cystoscopy was performed.  No sutures or bladder injuries were noted.  Ureters were noted with normal urine jets from each one was seen.  Foley was left out after the cystoscopic fluid was drained and cystoscope removed.  Sponge, lap, needle, instrument counts were correct x2. Patient tolerated the procedure very well. She was awakened  from anesthesia, extubated and taken to recovery in stable condition.    Dr. Karma Greaser

## 2023-09-25 NOTE — Telephone Encounter (Addendum)
Patient left message requesting note for work. S/p RLH  and bilateral salpingectomy on 09/25/23. Works in [employer].   Dr. Karma Greaser -letter pended with estimated RTW of 8 weeks, 11/20/23. Please review.   Spoke patient. Advised letter pended and to Dr. Karma Greaser to review. Will f/u once reviewed. Patient agreeable.

## 2023-09-25 NOTE — Progress Notes (Signed)
PO #1 RLH NOTE  Patient is feeling much better.  Has some dull expected pain but is voiding and ambulating and tolerating her diet. No VB  Blood pressure (!) 93/54, pulse 60, temperature 98 F (36.7 C), resp. rate 20, height 5' 6.75" (1.695 m), weight 83.5 kg, last menstrual period 09/19/2023, SpO2 98%.   Soft, approp tender incisions I/c/d No resp distress No c/c/e  A/p PO#1 RLH doing well  Discharge to home today 2. RTC with any concerns and with 2 week PO Patient's mother states the oxycodone was taken out of her car. Cannot refill but will discharge with gabapentin and motrin Dr. Karma Greaser

## 2023-09-26 LAB — SURGICAL PATHOLOGY

## 2023-09-26 NOTE — Progress Notes (Signed)
Tc to SW, SW states they will review chart and contact patient.  Frances Furbish, RN

## 2023-09-26 NOTE — Telephone Encounter (Signed)
Patient left message stating Gabapentin and GasX not at pharmacy.   Call placed to [facility], spoke with pharmacist, Romeo Apple. Was advised gabapentin and GasX Rx received and will be available for pick up today after 3pm. Reglan and Oxycodone filled and picked up on 10/3.   Patient notified.

## 2023-10-02 NOTE — Telephone Encounter (Signed)
Dr. Karma Greaser -patient is requesting update on letter for work. Letter pended, please review under communications.

## 2023-10-04 NOTE — Telephone Encounter (Signed)
Dr. Karma Greaser -please review letter. Ok to send?

## 2023-10-08 ENCOUNTER — Encounter: Payer: Self-pay | Admitting: Obstetrics and Gynecology

## 2023-10-08 ENCOUNTER — Ambulatory Visit (INDEPENDENT_AMBULATORY_CARE_PROVIDER_SITE_OTHER): Payer: Medicaid Other | Admitting: Obstetrics and Gynecology

## 2023-10-08 VITALS — BP 104/70 | HR 72 | Wt 184.0 lb

## 2023-10-08 DIAGNOSIS — Z9889 Other specified postprocedural states: Secondary | ICD-10-CM

## 2023-10-08 NOTE — Progress Notes (Signed)
Patient presents for 2 week postop from [facility], bilateral salpingectomy, cystoscopy. She is doing well. No fevers, VB, dysuria.  Blood pressure 104/70, pulse 72, weight 184 lb (83.5 kg), last menstrual period 09/19/2023, SpO2 100%.  Abdomen: incisions I/c/d, NT  A/p PO from [facility] 2 weeks doing well  Encouraged no heavy  2. Pelvic rest for the entire 10 wks 3. RTC with any concerns or with heavy bleeding, fevers or severe abdominal pain.  Dr. Karma Greaser

## 2023-10-09 NOTE — Assessment & Plan Note (Deleted)
-  Patient has had dysfunctional uterine bleeding, menorrhagia for several years.  Because of persistent bleeding, patient opted to proceed with robotic hysterectomy on 09/25/2023. -She was referred to [name] for management of iron deficiency anemia. -Labs in September 2024 showed evidence of iron deficiency with iron saturation of 4%.  Hemoglobin was 8.1 around that time.  Recent labs showed hemoglobin of 9, persistent microcytosis with MCV of 63.

## 2023-10-10 ENCOUNTER — Inpatient Hospital Stay: Payer: Medicaid Other

## 2023-10-10 ENCOUNTER — Inpatient Hospital Stay: Payer: Medicaid Other | Admitting: Oncology

## 2023-10-10 DIAGNOSIS — D500 Iron deficiency anemia secondary to blood loss (chronic): Secondary | ICD-10-CM

## 2023-10-11 ENCOUNTER — Inpatient Hospital Stay: Payer: Medicaid Other | Attending: Oncology | Admitting: Hematology

## 2023-10-11 ENCOUNTER — Other Ambulatory Visit: Payer: Medicaid Other

## 2023-10-15 NOTE — Telephone Encounter (Signed)
Letter printed and to Dr. Karma Greaser to sign and mail.   Spoke with patient. Patient reports intermittent clear to orange vaginal discharge. 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 Denies pain, fever, chills, vaginal odor or heavy bleeding. Is straining with some BMs. OV scheduled for 10/30 at 1130 with Dr. Karma Greaser.  Routing to provider for final review. Patient is agreeable to disposition. Will close encounter.

## 2023-10-16 NOTE — Telephone Encounter (Signed)
Call placed to patient, left detailed message, ok per dpr.  Advised per Dr. Karma Greaser. Return call to office at [phone], option 4,  if any additional questions.

## 2023-10-17 ENCOUNTER — Encounter: Payer: Self-pay | Admitting: Obstetrics and Gynecology

## 2023-10-17 ENCOUNTER — Ambulatory Visit (INDEPENDENT_AMBULATORY_CARE_PROVIDER_SITE_OTHER): Payer: Medicaid Other | Admitting: Obstetrics and Gynecology

## 2023-10-17 VITALS — BP 110/70 | HR 75

## 2023-10-17 DIAGNOSIS — Z09 Encounter for follow-up examination after completed treatment for conditions other than malignant neoplasm: Secondary | ICD-10-CM

## 2023-10-17 NOTE — Progress Notes (Signed)
Patient is po [facility] 10/7 No heavy bleeding or fevers or pain. Noticed pink spotting then yellow to brown discharge H/o ?fistula with repair  Blood pressure 110/70, pulse 75, last menstrual period 09/19/2023, SpO2 99%.  SVE: cuff intact. No bleeding. Normal discharge No vaginal fistula or stool seen  Hymen small yellow discharge seen  A/p Patient encouraged to follow with GI provider she saw No heavy lifting or vaginal intercourse for 10 weeks Vaginal cuff healing appropriately  Dr. Karma Greaser

## 2023-10-29 ENCOUNTER — Ambulatory Visit: Payer: Medicaid Other | Admitting: Obstetrics and Gynecology

## 2023-11-02 ENCOUNTER — Telehealth: Payer: Self-pay | Admitting: Medical Oncology

## 2023-11-02 ENCOUNTER — Other Ambulatory Visit: Payer: Self-pay | Admitting: Medical Oncology

## 2023-11-02 ENCOUNTER — Other Ambulatory Visit: Payer: Self-pay | Admitting: Internal Medicine

## 2023-11-02 DIAGNOSIS — D500 Iron deficiency anemia secondary to blood loss (chronic): Secondary | ICD-10-CM

## 2023-11-02 NOTE — Telephone Encounter (Signed)
Pt confirmed appt for tomorrow.

## 2023-11-03 ENCOUNTER — Inpatient Hospital Stay: Payer: Medicaid Other | Attending: Oncology | Admitting: Internal Medicine

## 2023-11-03 ENCOUNTER — Inpatient Hospital Stay: Payer: Medicaid Other

## 2023-11-03 VITALS — BP 104/67 | HR 66 | Temp 97.5°F | Resp 20 | Wt 189.8 lb

## 2023-11-03 DIAGNOSIS — F17210 Nicotine dependence, cigarettes, uncomplicated: Secondary | ICD-10-CM | POA: Diagnosis not present

## 2023-11-03 DIAGNOSIS — K603 Anal fistula, unspecified: Secondary | ICD-10-CM | POA: Diagnosis not present

## 2023-11-03 DIAGNOSIS — D500 Iron deficiency anemia secondary to blood loss (chronic): Secondary | ICD-10-CM | POA: Insufficient documentation

## 2023-11-03 DIAGNOSIS — Z801 Family history of malignant neoplasm of trachea, bronchus and lung: Secondary | ICD-10-CM | POA: Insufficient documentation

## 2023-11-03 DIAGNOSIS — Z90710 Acquired absence of both cervix and uterus: Secondary | ICD-10-CM | POA: Diagnosis not present

## 2023-11-03 DIAGNOSIS — N920 Excessive and frequent menstruation with regular cycle: Secondary | ICD-10-CM | POA: Insufficient documentation

## 2023-11-03 LAB — CBC WITH DIFFERENTIAL (CANCER CENTER ONLY)
Abs Immature Granulocytes: 0.02 10*3/uL (ref 0.00–0.07)
Basophils Absolute: 0.1 10*3/uL (ref 0.0–0.1)
Basophils Relative: 1 %
Eosinophils Absolute: 0.4 10*3/uL (ref 0.0–0.5)
Eosinophils Relative: 5 %
HCT: 33.4 % — ABNORMAL LOW (ref 36.0–46.0)
Hemoglobin: 9.4 g/dL — ABNORMAL LOW (ref 12.0–15.0)
Immature Granulocytes: 0 %
Lymphocytes Relative: 35 %
Lymphs Abs: 2.8 10*3/uL (ref 0.7–4.0)
MCH: 18.2 pg — ABNORMAL LOW (ref 26.0–34.0)
MCHC: 28.1 g/dL — ABNORMAL LOW (ref 30.0–36.0)
MCV: 64.7 fL — ABNORMAL LOW (ref 80.0–100.0)
Monocytes Absolute: 0.4 10*3/uL (ref 0.1–1.0)
Monocytes Relative: 6 %
Neutro Abs: 4.3 10*3/uL (ref 1.7–7.7)
Neutrophils Relative %: 53 %
Platelet Count: 445 10*3/uL — ABNORMAL HIGH (ref 150–400)
RBC: 5.16 MIL/uL — ABNORMAL HIGH (ref 3.87–5.11)
RDW: 23.5 % — ABNORMAL HIGH (ref 11.5–15.5)
Smear Review: NORMAL
WBC Count: 8 10*3/uL (ref 4.0–10.5)
nRBC: 0 % (ref 0.0–0.2)

## 2023-11-03 LAB — CMP (CANCER CENTER ONLY)
ALT: 10 U/L (ref 0–44)
AST: 10 U/L — ABNORMAL LOW (ref 15–41)
Albumin: 4.2 g/dL (ref 3.5–5.0)
Alkaline Phosphatase: 59 U/L (ref 38–126)
Anion gap: 4 — ABNORMAL LOW (ref 5–15)
BUN: 10 mg/dL (ref 6–20)
CO2: 26 mmol/L (ref 22–32)
Calcium: 9.8 mg/dL (ref 8.9–10.3)
Chloride: 109 mmol/L (ref 98–111)
Creatinine: 0.67 mg/dL (ref 0.44–1.00)
GFR, Estimated: >60 mL/min (ref >60)
Glucose, Bld: 93 mg/dL (ref 70–99)
Potassium: 3.6 mmol/L (ref 3.5–5.1)
Sodium: 139 mmol/L (ref 135–145)
Total Bilirubin: 0.5 mg/dL (ref <1.2)
Total Protein: 7.3 g/dL (ref 6.5–8.1)

## 2023-11-03 LAB — VITAMIN B12: Vitamin B-12: 431 pg/mL (ref 180–914)

## 2023-11-03 LAB — IRON AND IRON BINDING CAPACITY (CC-WL,HP ONLY)
Iron: 8 ug/dL — ABNORMAL LOW (ref 28–170)
Saturation Ratios: 2 % — ABNORMAL LOW (ref 10.4–31.8)
TIBC: 353 ug/dL (ref 250–450)
UIBC: 345 ug/dL (ref 148–442)

## 2023-11-03 LAB — FERRITIN: Ferritin: 23 ng/mL (ref 11–307)

## 2023-11-03 LAB — FOLATE: Folate: 6.7 ng/mL (ref >5.9)

## 2023-11-03 NOTE — Progress Notes (Signed)
[facility] [phone]   [phone]  CONSULT NOTE  REFERRING PHYSICIAN: Dr. Mikle Bosworth  REASON FOR CONSULTATION:  37 years old African-American female with persistent iron deficiency anemia  HPI [name] [name] is a 37 y.o. female with past medical history significant for multiple medical problems including history of persistent anemia secondary to menorrhagia, anal fissure, bipolar disorder, chronic bronchitis, depression, hemorrhoids, hypokalemia, migraine headaches, perianal abscess, substance abuse as well as suicidal attempts and frequent urinary tract infections.  The patient has a history of persistent anemia secondary to menorrhagia requiring several blood transfusion as well as iron infusion that was unresponsive to hormonal therapy.  She recently underwent robotic hysterectomy with bilateral salpingectomy and cystoscopy under the care of Dr. Arman Filter on September 25, 2023.  Because of her persistent anemia she was referred to me today for evaluation and recommendation regarding her condition.  She is currently on ferrous sulfate 325 mg p.o. daily. Discussed the use of AI scribe software for clinical note transcription with the patient, who gave verbal consent to proceed.  History of Present Illness   The patient, a 37 year old with a lifelong history of anemia, presents for evaluation of her condition. She reports having suffered from heavy menstrual bleeding, or menorrhagia, throughout her life, which has been a significant contributor to her anemia. The patient underwent a hysterectomy recently due to a backward-turned uterus, cervical polyps, and an enlarged uterus. She also had her tubes clipped in 2019.  The patient's menstrual cycle has been inconsistent, varying from three to seven days, with varying degrees of heaviness. She reports experiencing clots and severe cramps, which at one point led to an ER visit. The patient also  underwent a blood transfusion and iron transfusion in the past due to the severity of her symptoms.  In July of the current year, the patient experienced a longer than usual menstrual cycle, followed by cramping. She also noticed a pattern of bleeding for two days, stopping, and then bleeding heavily for one day before stopping again. This pattern continued until August. In October, the patient was advised to go to the ER for blood work and an iron infusion due to breathlessness.  Post-hysterectomy, the patient reports days of clear discharge, followed by days of orangish or pinkish discharge. She also reports abdominal pain around the area of the stitches. The patient also has a history of hemorrhoids and perianal fistula, which have been treated in the past.  The patient reports fatigue, dizziness, and occasional blackouts. She also experiences shortness of breath and occasional nausea. She has been prescribed iron tablets, but her adherence to the medication has been inconsistent. The patient also reports a history of smoking and occasional alcohol consumption. She has a family history of high blood pressure, diabetes, arthritis, and lung cancer. The patient works as a Dietitian, Personal assistant for a living.       HPI  Past Medical History:  Diagnosis Date   Abscess of axilla    many years ago   Anal fissure 2014   Anemia    s/p blood transfusions, 09/05/23 and 2019   Anxiety    Bipolar 1 disorder (HCC)    no meds   Chronic bronchitis (HCC)    Patient has an inhaler to use if needed. Patient states that she rarely uses inhaler. Patient has cut back on smoking.   COVID-19 02/28/2020   LOSS OF TASTE AND SMELL, NOW RESOLVED AFTER QUARANTINE   Depression  bipolar; not on meds   Dyspnea    due to IDA per patient.   Heart murmur    as a child none now   Hemorrhoids    Hypokalemia 06/17/2012   Migraines    Pt has to take a nap when she gets a headache.   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   Substance abuse (HCC)    hx of cocaine abuse, per patient on 09/19/2023 no cocaine since 2019, no longer using marijuana per pt   Suicide attempt (HCC) 06/19/2012   UTI (lower urinary tract infection) 06/19/2012    Past Surgical History:  Procedure Laterality Date   CYSTOSCOPY N/A 09/24/2023   Procedure: CYSTOSCOPY;  Surgeon: Earley Favor, MD;  Location: [facility];  Service: Gynecology;  Laterality: N/A;   DENTAL SURGERY     2022   HEMORRHOID SURGERY N/A 03/25/2020   Procedure: HEMORRHOIDECTOMY;  Surgeon: Karie Soda, MD;  Location: [facility] [place];  Service: General;  Laterality: N/A;   INCISION AND DRAINAGE PERIRECTAL ABSCESS N/A 01/16/2018   Procedure: IRRIGATION AND DEBRIDEMENT PERIRECTAL ABSCESS;  Surgeon: Sheliah Hatch De Blanch, MD;  Location: [facility];  Service: General;  Laterality: N/A;   LAPAROSCOPIC TUBAL LIGATION Bilateral 02/05/2019   Procedure: LAPAROSCOPIC TUBAL LIGATION WITH CERVICAL POLYPECTOMY;  Surgeon: Allie Bossier, MD;  Location: [facility];  Service: Gynecology;  Laterality: Bilateral;   RECTAL EXAM UNDER ANESTHESIA N/A 03/25/2020   Procedure: ANORECTAL EXAM UNDER ANESTHESIA, REPAIR OF PERIRECTAL FISTULA;  Surgeon: Karie Soda, MD;  Location: [facility];  Service: General;  Laterality: N/A;   ROBOTIC ASSISTED LAPAROSCOPIC HYSTERECTOMY AND SALPINGECTOMY Bilateral 09/24/2023   Procedure: XI ROBOTIC ASSISTED LAPAROSCOPIC HYSTERECTOMY AND SALPINGECTOMY;  Surgeon: Earley Favor, MD;  Location: [facility];  Service: Gynecology;  Laterality: Bilateral;    Family History  Problem Relation Age of Onset   Hypertension Mother    Diabetes type II Mother    Arthritis Mother    Hyperlipidemia Mother    Cancer Father        lung    Social History Social History   Tobacco  Use   Smoking status: Every Day    Current packs/day: 0.00    Types: Cigarettes    Last attempt to quit: 01/14/2009    Years since quitting: 14.8   Smokeless tobacco: Never   Tobacco comments:    Per patient, she only smokes 1 - 1 1/2 cigarettes per day. Rarely vapes as of 09/19/2023.  Vaping Use   Vaping status: Some Days   Substances: Nicotine, Flavoring  Substance Use Topics   Alcohol use: Yes    Comment: 4-5 shots a week   Drug use: Not Currently    Types: Marijuana, Cocaine    Comment: last time was 2019 for cocaine    Allergies  Allergen Reactions   Amoxicillin Nausea And Vomiting    Has patient had a PCN reaction causing immediate rash, facial/tongue/throat swelling, SOB or lightheadedness with hypotension: No Has patient had a PCN reaction causing severe rash involving mucus membranes or skin necrosis: No Has patient had a PCN reaction that required hospitalization Yes Has patient had a PCN reaction occurring within the last 10 years: Yes If all of the above answers are "NO", then may proceed with Cephalosporin use.   Penicillins Nausea And Vomiting  Has patient had a PCN reaction causing immediate rash, facial/tongue/throat swelling, SOB or lightheadedness with hypotension: No Has patient had a PCN reaction causing severe rash involving mucus membranes or skin necrosis: No Has patient had a PCN reaction that required hospitalization Yes Has patient had a PCN reaction occurring within the last 10 years: No If all of the above answers are "NO", then may proceed with Cephalosporin use.     Current Outpatient Medications  Medication Sig Dispense Refill   beclomethasone (QVAR) 40 MCG/ACT inhaler Inhale into the lungs as needed.     ferrous sulfate 325 (65 FE) MG tablet Take 1 tablet (325 mg total) by mouth daily. 30 tablet 1   ibuprofen (ADVIL) 800 MG tablet Take 1 tablet (800 mg total) by mouth every 8 (eight) hours as needed. 30 tablet 1   Simethicone (GAS-X EXTRA  STRENGTH) 125 MG CAPS Take 1 capsule (125 mg total) by mouth 2 (two) times daily as needed (gas pain after surgery). 28 capsule 0   No current facility-administered medications for this visit.    Review of Systems  Constitutional: positive for fatigue Eyes: negative Ears, nose, mouth, throat, and face: negative Respiratory: positive for dyspnea on exertion Cardiovascular: negative Gastrointestinal: positive for constipation Genitourinary:negative Integument/breast: negative Hematologic/lymphatic: negative Musculoskeletal:negative Neurological: negative Behavioral/Psych: negative Endocrine: negative Allergic/Immunologic: negative  Physical Exam  ZOX:WRUEA, healthy, no distress, well nourished, and well developed SKIN: skin color, texture, turgor are normal, no rashes or significant lesions HEAD: Normocephalic, No masses, lesions, tenderness or abnormalities EYES: normal, PERRLA, Conjunctiva are pink and non-injected EARS: External ears normal, Canals clear OROPHARYNX:no exudate, no erythema, and lips, buccal mucosa, and tongue normal  NECK: supple, no adenopathy, no JVD LYMPH:  no palpable lymphadenopathy, no hepatosplenomegaly BREAST:not examined LUNGS: clear to auscultation , and palpation HEART: regular rate & rhythm, no murmurs, and no gallops ABDOMEN:abdomen soft, non-tender, normal bowel sounds, and no masses or organomegaly BACK: Back symmetric, no curvature., No CVA tenderness EXTREMITIES:no joint deformities, effusion, or inflammation, no edema  NEURO: alert & oriented x 3 with fluent speech, no focal motor/sensory deficits  PERFORMANCE STATUS: ECOG 1  LABORATORY DATA: Lab Results  Component Value Date   WBC 7.3 09/24/2023   HGB 9.0 (L) 09/24/2023   HCT 30.9 (L) 09/24/2023   MCV 63.7 (L) 09/24/2023   PLT 387 09/24/2023      Chemistry      Component Value Date/Time   NA 134 (L) 09/04/2023 1327   K 3.5 09/04/2023 1327   CL 108 09/04/2023 1327   CO2 20  (L) 09/04/2023 1327   BUN 6 09/04/2023 1327   CREATININE 0.64 09/04/2023 1327      Component Value Date/Time   CALCIUM 9.2 09/04/2023 1327   ALKPHOS 49 09/04/2023 1327   AST 13 (L) 09/04/2023 1327   ALT 15 09/04/2023 1327   BILITOT 0.4 09/04/2023 1327       RADIOGRAPHIC STUDIES: No results found.  ASSESSMENT AND PLAN: This is a very pleasant 37 years old African-American female with persistent iron deficiency anemia secondary to a long history of menorrhagia status post hysterectomy in October 2024.  She has intolerance to the oral iron tablets.  Assessment and Plan    Chronic Anemia Chronic anemia secondary to menorrhagia, present for many years. History of requiring blood transfusions and iron infusions. Recent hemoglobin 9.4, hematocrit 33.4, MCV 64.7. Not consistent with oral iron supplementation. Discussed risks of untreated anemia including fatigue, dizziness, and potential for severe anemia requiring transfusion.  Benefits of iron infusion include improved hemoglobin levels and reduced symptoms. Alternatives include continued oral iron supplementation. - Continue oral iron supplementation every other day with orange juice - Arrange for iron infusion with Venofer weekly for three weeks - Follow-up in three months to reassess hemoglobin levels and overall status  Post-Hysterectomy Status Recent hysterectomy on September 24, 2023, due to severe menorrhagia and associated anemia. Post-operative course includes abdominal pain, sensitivity, and discharge. No significant bleeding post-surgery. Discussed expected healing process and discharge patterns. Benefits include resolution of menorrhagia and associated anemia. Risks include potential for infection and post-operative complications. 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 Reports  occasional orange and pink discharge, raising concerns about potential infection or residual fistula activity. Discussed monitoring for signs of infection and potential need for specialist referral if symptoms persist. - Monitor for signs of infection or worsening symptoms - Consider referral to a specialist if symptoms persist or worsen  General Health Maintenance Smoking, occasional alcohol use, and past drug abuse. Family history of hypertension, diabetes, arthritis, and lung cancer. Discussed smoking cessation and moderation in alcohol consumption. - Encourage smoking cessation - Advise moderation in alcohol consumption - Continue monitoring for any new or worsening symptoms related to family history  Follow-up - Schedule follow-up appointment in three months - Coordinate with [facility] infusion center for iron infusion scheduling.   She was advised to call immediately if she has any other concerning symptoms in the interval. The patient voices understanding of current disease status and treatment options and is in agreement with the current care plan.  All questions were answered. The patient knows to call the clinic with any problems, questions or concerns. We can certainly see the patient much sooner if necessary.  Thank you so much for allowing me to participate in the care of [name] [name]. I will continue to follow up the patient with you and assist in her care.  The total time spent in the appointment was 60 minutes.  Disclaimer: This note was dictated with voice recognition software. Similar sounding words can inadvertently be transcribed and may not be corrected upon review.   Lajuana Matte November 03, 2023, 9:17 AM

## 2023-11-05 ENCOUNTER — Telehealth: Payer: Self-pay | Admitting: Pharmacy Technician

## 2023-11-05 NOTE — Telephone Encounter (Signed)
Auth Submission: NO AUTH NEEDED Site of care: Site of care: [facility] Payer:[employer] MEDICAID Medication & CPT/J Code(s) submitted: Venofer (Iron Sucrose) J1756 Route of submission (phone, fax, portal):  Phone # Fax # Auth type: Buy/Bill PB Units/visits requested: 3 Reference number:  Approval from: 11/05/23 to 12/18/23

## 2023-11-07 LAB — PROTEIN ELECTROPHORESIS, SERUM, WITH REFLEX
A/G Ratio: 1.2 (ref 0.7–1.7)
Albumin ELP: 3.8 g/dL (ref 2.9–4.4)
Alpha-1-Globulin: 0.2 g/dL (ref 0.0–0.4)
Alpha-2-Globulin: 0.7 g/dL (ref 0.4–1.0)
Beta Globulin: 1 g/dL (ref 0.7–1.3)
Gamma Globulin: 1.3 g/dL (ref 0.4–1.8)
Globulin, Total: 3.2 g/dL (ref 2.2–3.9)
Total Protein ELP: 7 g/dL (ref 6.0–8.5)

## 2023-11-22 ENCOUNTER — Telehealth: Payer: Self-pay | Admitting: *Deleted

## 2023-11-22 ENCOUNTER — Encounter: Payer: Self-pay | Admitting: Obstetrics and Gynecology

## 2023-11-22 ENCOUNTER — Ambulatory Visit (INDEPENDENT_AMBULATORY_CARE_PROVIDER_SITE_OTHER): Payer: Medicaid Other | Admitting: Obstetrics and Gynecology

## 2023-11-22 VITALS — BP 114/76 | HR 70 | Resp 16

## 2023-11-22 DIAGNOSIS — Z09 Encounter for follow-up examination after completed treatment for conditions other than malignant neoplasm: Secondary | ICD-10-CM

## 2023-11-22 DIAGNOSIS — N898 Other specified noninflammatory disorders of vagina: Secondary | ICD-10-CM

## 2023-11-22 NOTE — Progress Notes (Signed)
Patient is po [facility] 10/7 No heavy bleeding or fevers or pain. Reports having intercourse on Thanksgiving and nothing after that Denies any VB or pain with or after that time.   Blood pressure 114/76, pulse 70, resp. rate 16, last menstrual period 09/19/2023.  SVE: cuff intact. No bleeding. White discharge No vaginal fistula or stool seen    A/p Discussed no vaginal intercourse for the full 10 weeks, especially since she was tender with examination today Vaginal cuff healing appropriately.  Return to clinic for annual exams or sooner with any concerns and with annual exams Mammograms to start next July To get nuswab to rule out infection to allow finished cuff healing Patient may return to work without restrictions Dr. Karma Greaser

## 2023-11-22 NOTE — Telephone Encounter (Signed)
-----   Message from Earley Favor sent at 11/22/2023 11:47 AM EST ----- Needs a note in mychart that she can return to work with no restrictions. Thank you Dr. Karma Greaser

## 2023-11-22 NOTE — Telephone Encounter (Signed)
Letter sent via MyChart.   Patient notified by MyChart.   Routing FYI.  Encounter closed.

## 2023-11-23 LAB — SURESWAB® ADVANCED VAGINITIS PLUS,TMA
C. trachomatis RNA, TMA: NOT DETECTED
CANDIDA SPECIES: NOT DETECTED
Candida glabrata: NOT DETECTED
N. gonorrhoeae RNA, TMA: NOT DETECTED
SURESWAB(R) ADV BACTERIAL VAGINOSIS(BV),TMA: POSITIVE — AB
TRICHOMONAS VAGINALIS (TV),TMA: NOT DETECTED

## 2023-11-26 ENCOUNTER — Other Ambulatory Visit: Payer: Self-pay | Admitting: *Deleted

## 2023-11-26 MED ORDER — IRON SUCROSE 300 MG IVPB - SIMPLE MED: 300.0000 mg | Freq: Once | Status: DC

## 2023-11-26 MED ORDER — METRONIDAZOLE 500 MG PO TABS: 500.0000 mg | ORAL_TABLET | Freq: Two times a day (BID) | ORAL | 0 refills | Status: AC

## 2023-11-26 MED ORDER — ACETAMINOPHEN 325 MG PO TABS: 650.0000 mg | ORAL_TABLET | Freq: Once | ORAL | Status: DC

## 2023-11-26 MED ORDER — DIPHENHYDRAMINE HCL 25 MG PO CAPS: 25.0000 mg | ORAL_CAPSULE | Freq: Once | ORAL | Status: DC

## 2023-11-27 ENCOUNTER — Ambulatory Visit: Payer: Medicaid Other

## 2023-11-27 VITALS — BP 96/61 | HR 58 | Temp 97.8°F | Resp 16 | Ht 67.0 in | Wt 189.6 lb

## 2023-11-27 DIAGNOSIS — D509 Iron deficiency anemia, unspecified: Secondary | ICD-10-CM | POA: Diagnosis not present

## 2023-11-27 DIAGNOSIS — D500 Iron deficiency anemia secondary to blood loss (chronic): Secondary | ICD-10-CM

## 2023-11-27 MED ORDER — DIPHENHYDRAMINE HCL 25 MG PO CAPS
25.0000 mg | ORAL_CAPSULE | Freq: Once | ORAL | Status: AC
  Administered 2023-11-27: 25 mg via ORAL
  Filled 2023-11-27: qty 1

## 2023-11-27 MED ORDER — IRON SUCROSE 300 MG IVPB - SIMPLE MED
300.0000 mg | Freq: Once | Status: AC
  Administered 2023-11-27: 300 mg via INTRAVENOUS
  Filled 2023-11-27: qty 265

## 2023-11-27 MED ORDER — ACETAMINOPHEN 325 MG PO TABS
650.0000 mg | ORAL_TABLET | Freq: Once | ORAL | Status: AC
  Administered 2023-11-27: 650 mg via ORAL
  Filled 2023-11-27: qty 2

## 2023-11-27 NOTE — Progress Notes (Signed)
Diagnosis: Iron Deficiency Anemia  Provider:  Chilton Greathouse MD  Procedure: IV Infusion  IV Type: Peripheral, IV Location: L Antecubital  Venofer (Iron Sucrose), Dose: 300 mg  Infusion Start Time: 1331  Infusion Stop Time: 1515  Post Infusion IV Care: Observation period completed and Peripheral IV Discontinued  Discharge: Condition: Good, Destination: Home . AVS Provided  Performed by:  Loney Hering, LPN

## 2023-11-28 ENCOUNTER — Telehealth: Payer: Self-pay | Admitting: *Deleted

## 2023-11-28 NOTE — Telephone Encounter (Signed)
Patient left message on surgery line requesting to extend her RTW date until vaginal cuff is healed.   Patient was seen on 11/22/23 for post-op visit, RTW letter provided, with no restrictions.   Patient is scheduled for post-op on 12/07/23.   Please advise.

## 2023-11-30 NOTE — Telephone Encounter (Signed)
10 weeks is 12/03/23. Patient is scheduled for post-op on 12/07/23.   Letter pended. Dr. Karma Greaser please review.

## 2023-12-02 MED ORDER — ACETAMINOPHEN 325 MG PO TABS
650.0000 mg | ORAL_TABLET | Freq: Once | ORAL | Status: AC
  Administered 2023-12-03: 650 mg via ORAL
  Filled 2023-12-02: qty 2

## 2023-12-02 MED ORDER — IRON SUCROSE 300 MG IVPB - SIMPLE MED
300.0000 mg | Freq: Once | Status: AC
  Administered 2023-12-03: 300 mg via INTRAVENOUS
  Filled 2023-12-02: qty 265

## 2023-12-02 MED ORDER — DIPHENHYDRAMINE HCL 25 MG PO CAPS
25.0000 mg | ORAL_CAPSULE | Freq: Once | ORAL | Status: AC
  Administered 2023-12-03: 25 mg via ORAL
  Filled 2023-12-02: qty 1

## 2023-12-03 ENCOUNTER — Ambulatory Visit: Payer: Medicaid Other | Admitting: *Deleted

## 2023-12-03 ENCOUNTER — Encounter: Payer: Medicaid Other | Admitting: Obstetrics and Gynecology

## 2023-12-03 VITALS — BP 99/65 | Temp 97.9°F | Resp 16 | Ht 66.0 in | Wt 183.6 lb

## 2023-12-03 DIAGNOSIS — D509 Iron deficiency anemia, unspecified: Secondary | ICD-10-CM

## 2023-12-03 DIAGNOSIS — D500 Iron deficiency anemia secondary to blood loss (chronic): Secondary | ICD-10-CM

## 2023-12-03 NOTE — Progress Notes (Signed)
Diagnosis: Iron Deficiency Anemia  Provider:  Chilton Greathouse MD  Procedure: IV Infusion  IV Type: Peripheral, IV Location: R Hand  Venofer (Iron Sucrose), Dose: 300 mg  Infusion Start Time: 1147 am  Infusion Stop Time: 1327 pm  Post Infusion IV Care: Observation period completed and Peripheral IV Discontinued  Discharge: Condition: Good, Destination: Home . AVS Declined  Performed by:  Forrest Moron, RN

## 2023-12-04 NOTE — Telephone Encounter (Signed)
Letter placed at front office for pick up.   Patient notified. Advised per Dr. Karma Greaser. Patient will keep OV as scheduled for 12/07/23 for f/u and further recommendations at that time regarding RTW.   Routing to provider for final review. Patient is agreeable to disposition. Will close encounter.

## 2023-12-04 NOTE — Telephone Encounter (Signed)
Letter printed and to Dr. Karma Greaser.

## 2023-12-04 NOTE — Telephone Encounter (Signed)
Letter signed by Dr. Karma Greaser and placed at front office for pick up.

## 2023-12-07 ENCOUNTER — Encounter: Payer: Self-pay | Admitting: Obstetrics and Gynecology

## 2023-12-07 ENCOUNTER — Ambulatory Visit (INDEPENDENT_AMBULATORY_CARE_PROVIDER_SITE_OTHER): Payer: Medicaid Other | Admitting: Obstetrics and Gynecology

## 2023-12-07 VITALS — BP 118/74 | HR 67 | Ht 66.0 in | Wt 183.0 lb

## 2023-12-07 DIAGNOSIS — Z09 Encounter for follow-up examination after completed treatment for conditions other than malignant neoplasm: Secondary | ICD-10-CM

## 2023-12-07 NOTE — Progress Notes (Signed)
Patient presents for 10 week postop from [facility], bilateral salpingectomy, cystoscopy. She is doing well. No fevers, VB, dysuria or severe abdominal pain. Took one dose of flagyl and was sick for 3 days. Insurance will not cover alternative.  BP 118/74 (BP Location: Left Arm, Patient Position: Sitting, Cuff Size: Small)   Pulse 67   Ht 5\' 6"  (1.676 m)   Wt 183 lb (83 kg)   LMP 09/19/2023   SpO2 98%   BMI 29.54 kg/m   SVE: sutures dissolved, BV discharge, no bleeding  A/p PO from [facility] 10 weeks doing well Resume activities with no restrictions at work 2. Pelvic rest for additional 1 more weeks since tender with speculum exam 3. Encouraged annual mammograms and resume annual care with PCP  Dr. Karma Greaser

## 2023-12-10 ENCOUNTER — Ambulatory Visit: Payer: Medicaid Other | Admitting: *Deleted

## 2023-12-10 VITALS — BP 118/63 | HR 79 | Temp 98.1°F | Resp 16 | Ht 67.0 in | Wt 188.4 lb

## 2023-12-10 DIAGNOSIS — D509 Iron deficiency anemia, unspecified: Secondary | ICD-10-CM

## 2023-12-10 DIAGNOSIS — D500 Iron deficiency anemia secondary to blood loss (chronic): Secondary | ICD-10-CM

## 2023-12-10 MED ORDER — IRON SUCROSE 300 MG IVPB - SIMPLE MED
300.0000 mg | Freq: Once | Status: AC
  Administered 2023-12-10: 300 mg via INTRAVENOUS
  Filled 2023-12-10: qty 265

## 2023-12-10 MED ORDER — DIPHENHYDRAMINE HCL 25 MG PO CAPS
25.0000 mg | ORAL_CAPSULE | Freq: Once | ORAL | Status: AC
  Administered 2023-12-10: 25 mg via ORAL
  Filled 2023-12-10: qty 1

## 2023-12-10 MED ORDER — ACETAMINOPHEN 325 MG PO TABS
650.0000 mg | ORAL_TABLET | Freq: Once | ORAL | Status: AC
  Administered 2023-12-10: 650 mg via ORAL
  Filled 2023-12-10: qty 2

## 2023-12-10 NOTE — Progress Notes (Signed)
Diagnosis: Iron Deficiency Anemia  Provider:  Chilton Greathouse MD  Procedure: IV Infusion  IV Type: Peripheral, IV Location: L Hand  Venofer (Iron Sucrose), Dose: 300 mg  Infusion Start Time: 1145 am  Infusion Stop Time: 1321 pm  Post Infusion IV Care: Observation period completed and Peripheral IV Discontinued  Discharge: Condition: Good, Destination: Home . AVS Declined  Performed by:  Forrest Moron, RN

## 2024-02-04 ENCOUNTER — Inpatient Hospital Stay (HOSPITAL_BASED_OUTPATIENT_CLINIC_OR_DEPARTMENT_OTHER): Payer: Medicaid Other | Admitting: Internal Medicine

## 2024-02-04 ENCOUNTER — Other Ambulatory Visit: Payer: Self-pay | Admitting: Internal Medicine

## 2024-02-04 ENCOUNTER — Inpatient Hospital Stay: Payer: Medicaid Other | Attending: Oncology

## 2024-02-04 VITALS — BP 107/73 | HR 71 | Temp 97.2°F | Resp 16 | Ht 67.0 in | Wt 190.0 lb

## 2024-02-04 DIAGNOSIS — N920 Excessive and frequent menstruation with regular cycle: Secondary | ICD-10-CM | POA: Insufficient documentation

## 2024-02-04 DIAGNOSIS — Z90710 Acquired absence of both cervix and uterus: Secondary | ICD-10-CM | POA: Diagnosis not present

## 2024-02-04 DIAGNOSIS — D500 Iron deficiency anemia secondary to blood loss (chronic): Secondary | ICD-10-CM | POA: Diagnosis present

## 2024-02-04 LAB — IRON AND IRON BINDING CAPACITY (CC-WL,HP ONLY)
Iron: 17 ug/dL — ABNORMAL LOW (ref 28–170)
Saturation Ratios: 6 % — ABNORMAL LOW (ref 10.4–31.8)
TIBC: 308 ug/dL (ref 250–450)
UIBC: 291 ug/dL (ref 148–442)

## 2024-02-04 LAB — CBC WITH DIFFERENTIAL (CANCER CENTER ONLY)
Abs Immature Granulocytes: 0.02 10*3/uL (ref 0.00–0.07)
Basophils Absolute: 0 10*3/uL (ref 0.0–0.1)
Basophils Relative: 1 %
Eosinophils Absolute: 0.2 10*3/uL (ref 0.0–0.5)
Eosinophils Relative: 3 %
HCT: 35.3 % — ABNORMAL LOW (ref 36.0–46.0)
Hemoglobin: 10.7 g/dL — ABNORMAL LOW (ref 12.0–15.0)
Immature Granulocytes: 0 %
Lymphocytes Relative: 43 %
Lymphs Abs: 2.7 10*3/uL (ref 0.7–4.0)
MCH: 20.5 pg — ABNORMAL LOW (ref 26.0–34.0)
MCHC: 30.3 g/dL (ref 30.0–36.0)
MCV: 67.5 fL — ABNORMAL LOW (ref 80.0–100.0)
Monocytes Absolute: 0.3 10*3/uL (ref 0.1–1.0)
Monocytes Relative: 5 %
Neutro Abs: 3.1 10*3/uL (ref 1.7–7.7)
Neutrophils Relative %: 48 %
Platelet Count: 312 10*3/uL (ref 150–400)
RBC: 5.23 MIL/uL — ABNORMAL HIGH (ref 3.87–5.11)
RDW: 23.1 % — ABNORMAL HIGH (ref 11.5–15.5)
WBC Count: 6.4 10*3/uL (ref 4.0–10.5)
nRBC: 0 % (ref 0.0–0.2)

## 2024-02-04 LAB — FERRITIN: Ferritin: 115 ng/mL (ref 11–307)

## 2024-02-04 NOTE — Progress Notes (Signed)
 [facility] [phone]   Fax:([phone]  OFFICE PROGRESS NOTE  [name] Converse, MD [address] [place] [address]  DIAGNOSIS: Persistent iron deficiency anemia secondary to a long history of menorrhagia status post hysterectomy in October 2024. She has intolerance to the oral iron tablets.   PRIOR THERAPY: Iron fusion with Venofer 300 Mg IV weekly for 3 weeks  CURRENT THERAPY: Over-the-counter ferrous sulfate with orange juice.  INTERVAL HISTORY: [name] [name] 38 y.o. female returns to the clinic today for follow-up visit.Discussed the use of AI scribe software for clinical note transcription with the patient, who gave verbal consent to proceed.  History of Present Illness   [name] [name] is a 39 year old female with iron deficiency anemia who presents for follow-up after iron infusions.  She has a history of significant menstrual bleeding, which led to a hysterectomy in October. Following the surgery, she received three iron infusions at the [facility].  After the iron infusions, she experienced an episode where her blood pressure and heart rate dropped, requiring observation until she felt better. She continues to feel tired despite the infusions. No current bleeding, including no blood in stool, epistaxis, or gum bleeding.  She is not taking any iron supplements due to gastrointestinal upset, including constipation, even when using stool softeners. Her diet includes iron-rich foods such as collard greens, cabbage, and spinach, often paired with citrus to enhance iron absorption. She avoids beets.  Her hemoglobin level has improved from 9 to 10.7 since her last visit. She is awaiting results for iron and ferritin levels to determine if further infusions are necessary.       MEDICAL HISTORY: Past Medical History:  Diagnosis Date   Abscess of axilla    many years ago   Anal fissure 2014   Anemia     s/p blood transfusions, 09/05/23 and 2019   Anxiety    Bipolar 1 disorder (HCC)    no meds   Chronic bronchitis (HCC)    Patient has an inhaler to use if needed. Patient states that she rarely uses inhaler. Patient has cut back on smoking.   COVID-19 02/28/2020   LOSS OF TASTE AND SMELL, NOW RESOLVED AFTER QUARANTINE   Depression    bipolar; not on meds   Dyspnea    due to IDA per patient.   Heart murmur    as a child none now   Hemorrhoids    Hypokalemia 06/17/2012   Migraines    Pt has to take a nap when she gets a headache.   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   Substance abuse (HCC)    hx of cocaine abuse, per patient on 09/19/2023 no cocaine since 2019, no longer using marijuana per pt   Suicide attempt (HCC) 06/19/2012   UTI (lower urinary tract infection) 06/19/2012    ALLERGIES:  is allergic to amoxicillin and penicillins.  MEDICATIONS:  Current Outpatient Medications  Medication Sig Dispense Refill   ferrous sulfate 325 (65 FE) MG tablet Take 1 tablet (325 mg total) by mouth daily. 30 tablet 1   ibuprofen (ADVIL) 800 MG tablet Take 1 tablet (800 mg total) by mouth every 8 (eight) hours as needed. 30 tablet 1   Simethicone (GAS-X EXTRA STRENGTH) 125 MG CAPS Take 1 capsule (125 mg  total) by mouth 2 (two) times daily as needed (gas pain after surgery). 28 capsule 0   No current facility-administered medications for this visit.    SURGICAL HISTORY:  Past Surgical History:  Procedure Laterality Date   CYSTOSCOPY N/A 09/24/2023   Procedure: CYSTOSCOPY;  Surgeon: Earley Favor, MD;  Location: [facility];  Service: Gynecology;  Laterality: N/A;   DENTAL SURGERY     2022   HEMORRHOID SURGERY N/A 03/25/2020   Procedure: HEMORRHOIDECTOMY;  Surgeon: Karie Soda, MD;  Location: [facility];  Service: General;  Laterality: N/A;    INCISION AND DRAINAGE PERIRECTAL ABSCESS N/A 01/16/2018   Procedure: IRRIGATION AND DEBRIDEMENT PERIRECTAL ABSCESS;  Surgeon: Sheliah Hatch De Blanch, MD;  Location: [facility];  Service: General;  Laterality: N/A;   LAPAROSCOPIC TUBAL LIGATION Bilateral 02/05/2019   Procedure: LAPAROSCOPIC TUBAL LIGATION WITH CERVICAL POLYPECTOMY;  Surgeon: Allie Bossier, MD;  Location: [facility];  Service: Gynecology;  Laterality: Bilateral;   RECTAL EXAM UNDER ANESTHESIA N/A 03/25/2020   Procedure: ANORECTAL EXAM UNDER ANESTHESIA, REPAIR OF PERIRECTAL FISTULA;  Surgeon: Karie Soda, MD;  Location: [facility];  Service: General;  Laterality: N/A;   ROBOTIC ASSISTED LAPAROSCOPIC HYSTERECTOMY AND SALPINGECTOMY Bilateral 09/24/2023   Procedure: XI ROBOTIC ASSISTED LAPAROSCOPIC HYSTERECTOMY AND SALPINGECTOMY;  Surgeon: Earley Favor, MD;  Location: [facility];  Service: Gynecology;  Laterality: Bilateral;    REVIEW OF SYSTEMS:  A comprehensive review of systems was negative except for: Constitutional: positive for fatigue   PHYSICAL EXAMINATION: General appearance: alert, cooperative, and no distress Head: Normocephalic, without obvious abnormality, atraumatic Neck: no adenopathy, no JVD, supple, symmetrical, trachea midline, and thyroid not enlarged, symmetric, no tenderness/mass/nodules Lymph nodes: Cervical, supraclavicular, and axillary nodes normal. Resp: clear to auscultation bilaterally Back: symmetric, no curvature. ROM normal. No CVA tenderness. Cardio: regular rate and rhythm, S1, S2 normal, no murmur, click, rub or gallop GI: soft, non-tender; bowel sounds normal; no masses,  no organomegaly Extremities: extremities normal, atraumatic, no cyanosis or edema  ECOG PERFORMANCE STATUS: 1 - Symptomatic but completely ambulatory  Blood pressure 107/73, pulse 71, temperature (!) 97.2 F (36.2 C), temperature source Temporal, resp. rate 16, height 5\' 7"  (1.702  m), weight 190 lb (86.2 kg), last menstrual period 09/19/2023, SpO2 100%.  LABORATORY DATA: Lab Results  Component Value Date   WBC 6.4 02/04/2024   HGB 10.7 (L) 02/04/2024   HCT 35.3 (L) 02/04/2024   MCV 67.5 (L) 02/04/2024   PLT 312 02/04/2024      Chemistry      Component Value Date/Time   NA 139 11/03/2023 0845   K 3.6 11/03/2023 0845   CL 109 11/03/2023 0845   CO2 26 11/03/2023 0845   BUN 10 11/03/2023 0845   CREATININE 0.67 11/03/2023 0845      Component Value Date/Time   CALCIUM 9.8 11/03/2023 0845   ALKPHOS 59 11/03/2023 0845   AST 10 (L) 11/03/2023 0845   ALT 10 11/03/2023 0845   BILITOT 0.5 11/03/2023 0845       RADIOGRAPHIC STUDIES: No results found.  ASSESSMENT AND PLAN: This is a very pleasant 38 years old African-American female with history of iron deficiency anemia secondary to previous menorrhagia but she has hysterectomy in October 2024. She was treated with iron infusion with Venofer 300 Mg IV weekly for 3 weeks and feeling a little bit better.    Iron Deficiency Anemia Iron deficiency anemia secondary to heavy menstrual bleeding, status  post-hysterectomy in October. She received three iron infusions at the [facility], with one episode of transient hypotension and bradycardia but no allergic reaction. Reports persistent fatigue, no active bleeding. Hemoglobin improved from 9 to 10.7, still below target of 12. Not taking iron supplements due to gastrointestinal upset and constipation despite stool softeners. Consumes iron-rich foods. Awaiting iron and ferritin results to determine need for additional infusions. - Order iron and ferritin studies - Consider additional iron infusion based on results - Follow up in three months with lab work.   The patient was advised to call immediately if she has any concerning symptoms in the interval. The patient voices understanding of current disease status and treatment options and is in agreement with  the current care plan.  All questions were answered. The patient knows to call the clinic with any problems, questions or concerns. We can certainly see the patient much sooner if necessary.  The total time spent in the appointment was 20 minutes.  Disclaimer: This note was dictated with voice recognition software. Similar sounding words can inadvertently be transcribed and may not be corrected upon review.

## 2024-03-12 ENCOUNTER — Encounter: Payer: Self-pay | Admitting: Internal Medicine

## 2024-07-29 ENCOUNTER — Telehealth: Payer: Self-pay | Admitting: Medical Oncology

## 2024-07-29 NOTE — Telephone Encounter (Signed)
 Pt confirmed appt next week.

## 2024-08-04 ENCOUNTER — Telehealth: Payer: Self-pay

## 2024-08-04 ENCOUNTER — Inpatient Hospital Stay (HOSPITAL_BASED_OUTPATIENT_CLINIC_OR_DEPARTMENT_OTHER): Payer: Medicaid Other | Admitting: Internal Medicine

## 2024-08-04 ENCOUNTER — Inpatient Hospital Stay: Payer: Medicaid Other | Attending: Internal Medicine

## 2024-08-04 VITALS — BP 100/66 | HR 58 | Temp 97.2°F | Resp 17 | Ht 67.0 in | Wt 196.0 lb

## 2024-08-04 DIAGNOSIS — N920 Excessive and frequent menstruation with regular cycle: Secondary | ICD-10-CM | POA: Insufficient documentation

## 2024-08-04 DIAGNOSIS — Z90710 Acquired absence of both cervix and uterus: Secondary | ICD-10-CM | POA: Diagnosis not present

## 2024-08-04 DIAGNOSIS — D500 Iron deficiency anemia secondary to blood loss (chronic): Secondary | ICD-10-CM | POA: Diagnosis not present

## 2024-08-04 LAB — CBC WITH DIFFERENTIAL (CANCER CENTER ONLY)
Abs Immature Granulocytes: 0.01 K/uL (ref 0.00–0.07)
Basophils Absolute: 0 K/uL (ref 0.0–0.1)
Basophils Relative: 1 %
Eosinophils Absolute: 0.1 K/uL (ref 0.0–0.5)
Eosinophils Relative: 1 %
HCT: 34.8 % — ABNORMAL LOW (ref 36.0–46.0)
Hemoglobin: 10.7 g/dL — ABNORMAL LOW (ref 12.0–15.0)
Immature Granulocytes: 0 %
Lymphocytes Relative: 50 %
Lymphs Abs: 3 K/uL (ref 0.7–4.0)
MCH: 21.3 pg — ABNORMAL LOW (ref 26.0–34.0)
MCHC: 30.7 g/dL (ref 30.0–36.0)
MCV: 69.3 fL — ABNORMAL LOW (ref 80.0–100.0)
Monocytes Absolute: 0.4 K/uL (ref 0.1–1.0)
Monocytes Relative: 6 %
Neutro Abs: 2.6 K/uL (ref 1.7–7.7)
Neutrophils Relative %: 42 %
Platelet Count: 349 K/uL (ref 150–400)
RBC: 5.02 MIL/uL (ref 3.87–5.11)
RDW: 19.2 % — ABNORMAL HIGH (ref 11.5–15.5)
WBC Count: 6.1 K/uL (ref 4.0–10.5)
nRBC: 0 % (ref 0.0–0.2)

## 2024-08-04 LAB — IRON AND IRON BINDING CAPACITY (CC-WL,HP ONLY)
Iron: 20 ug/dL — ABNORMAL LOW (ref 28–170)
Saturation Ratios: 7 % — ABNORMAL LOW (ref 10.4–31.8)
TIBC: 286 ug/dL (ref 250–450)
UIBC: 266 ug/dL (ref 148–442)

## 2024-08-04 LAB — FERRITIN: Ferritin: 184 ng/mL (ref 11–307)

## 2024-08-04 NOTE — Progress Notes (Signed)
 [facility] Telephone:([phone]) [phone]   Fax:([phone]) [phone]  OFFICE PROGRESS NOTE  [name] Elveria LABOR, MD [address] [place] [address]  DIAGNOSIS: Persistent iron  deficiency anemia secondary to a long history of menorrhagia status post hysterectomy in October 2024. She has intolerance to the oral iron  tablets.   PRIOR THERAPY: Iron  fusion with Venofer  300 Mg IV weekly for 3 weeks  CURRENT THERAPY: Over-the-counter ferrous sulfate  with orange juice.  INTERVAL HISTORY: [name] [name] 38 y.o. female returns to the clinic today for follow-up visit.Discussed the use of AI scribe software for clinical note transcription with the patient, who gave verbal consent to proceed.  History of Present Illness [name] [name] is a 38 year old female with persistent iron  deficiency anemia who presents for evaluation and repeat blood work.  She has a history of persistent iron  deficiency anemia secondary to a long history of menorrhagia. Despite undergoing a hysterectomy in October 2024, her hemoglobin remains low at 10.7 g/dL, and her MCV is 30.6 fL. She has been intolerant to oral iron  tablets in the past and has received iron  infusions. Currently, she takes over-the-counter ferrous sulfate  occasionally with orange juice.  She experiences occasional fatigue and weakness, though these symptoms are less frequent and severe than before. She experiences sharp pain on the right side of her face and neck, which she describes as simultaneous and intense, requiring her to squeeze the area until it subsides after a few seconds. She has experienced dizziness two to three times.  Her diet includes high protein and high iron  foods such as spinach, kale, and red meat, and she uses powder supplements and daily drinks to maintain her iron  intake.     MEDICAL HISTORY: Past Medical History:  Diagnosis Date   Abscess of axilla    many years ago   Anal fissure 2014   Anemia     s/p blood transfusions, 09/05/23 and 2019   Anxiety    Bipolar 1 disorder (HCC)    no meds   Chronic bronchitis (HCC)    Patient has an inhaler to use if needed. Patient states that she rarely uses inhaler. Patient has cut back on smoking.   COVID-19 02/28/2020   LOSS OF TASTE AND SMELL, NOW RESOLVED AFTER QUARANTINE   Depression    bipolar; not on meds   Dyspnea    due to IDA per patient.   Heart murmur    as a child none now   Hemorrhoids    Hypokalemia 06/17/2012   Migraines    Pt has to take a nap when she gets a headache.   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   Substance abuse (HCC)    hx of cocaine abuse, per patient on 09/19/2023 no cocaine since 2019, no longer using marijuana per pt   Suicide attempt (HCC) 06/19/2012   UTI (lower urinary tract infection) 06/19/2012    ALLERGIES:  is allergic to amoxicillin  and penicillins.  MEDICATIONS:  Current Outpatient Medications  Medication Sig Dispense Refill   ferrous sulfate  325 (65 FE) MG tablet Take 1 tablet (325 mg total) by mouth daily. 30 tablet 1   ibuprofen  (ADVIL ) 800 MG tablet Take 1 tablet (800 mg total) by mouth every 8 (eight) hours as needed. 30 tablet 1   Simethicone  (GAS-X EXTRA STRENGTH) 125 MG CAPS Take  1 capsule (125 mg total) by mouth 2 (two) times daily as needed (gas pain after surgery). 28 capsule 0   No current facility-administered medications for this visit.    SURGICAL HISTORY:  Past Surgical History:  Procedure Laterality Date   CYSTOSCOPY N/A 09/24/2023   Procedure: CYSTOSCOPY;  Surgeon: Glennon Almarie POUR, MD;  Location: [facility];  Service: Gynecology;  Laterality: N/A;   DENTAL SURGERY     2022   HEMORRHOID SURGERY N/A 03/25/2020   Procedure: HEMORRHOIDECTOMY;  Surgeon: Sheldon Standing, MD;  Location: [facility] [place];  Service: General;  Laterality: N/A;    INCISION AND DRAINAGE PERIRECTAL ABSCESS N/A 01/16/2018   Procedure: IRRIGATION AND DEBRIDEMENT PERIRECTAL ABSCESS;  Surgeon: Stevie Herlene Righter, MD;  Location: [facility];  Service: General;  Laterality: N/A;   LAPAROSCOPIC TUBAL LIGATION Bilateral 02/05/2019   Procedure: LAPAROSCOPIC TUBAL LIGATION WITH CERVICAL POLYPECTOMY;  Surgeon: Starla Harland BROCKS, MD;  Location: [facility];  Service: Gynecology;  Laterality: Bilateral;   RECTAL EXAM UNDER ANESTHESIA N/A 03/25/2020   Procedure: ANORECTAL EXAM UNDER ANESTHESIA, REPAIR OF PERIRECTAL FISTULA;  Surgeon: Sheldon Standing, MD;  Location: [facility];  Service: General;  Laterality: N/A;   ROBOTIC ASSISTED LAPAROSCOPIC HYSTERECTOMY AND SALPINGECTOMY Bilateral 09/24/2023   Procedure: XI ROBOTIC ASSISTED LAPAROSCOPIC HYSTERECTOMY AND SALPINGECTOMY;  Surgeon: Glennon Almarie POUR, MD;  Location: [facility];  Service: Gynecology;  Laterality: Bilateral;    REVIEW OF SYSTEMS:  A comprehensive review of systems was negative except for: Constitutional: positive for fatigue   PHYSICAL EXAMINATION: General appearance: alert, cooperative, fatigued, and no distress Head: Normocephalic, without obvious abnormality, atraumatic Neck: no adenopathy, no JVD, supple, symmetrical, trachea midline, and thyroid  not enlarged, symmetric, no tenderness/mass/nodules Lymph nodes: Cervical, supraclavicular, and axillary nodes normal. Resp: clear to auscultation bilaterally Back: symmetric, no curvature. ROM normal. No CVA tenderness. Cardio: regular rate and rhythm, S1, S2 normal, no murmur, click, rub or gallop GI: soft, non-tender; bowel sounds normal; no masses,  no organomegaly Extremities: extremities normal, atraumatic, no cyanosis or edema  ECOG PERFORMANCE STATUS: 1 - Symptomatic but completely ambulatory  Blood pressure 100/66, pulse (!) 58, temperature (!) 97.2 F (36.2 C), temperature source Temporal, resp. rate 17,  height 5' 7 (1.702 m), weight 196 lb (88.9 kg), last menstrual period 09/19/2023, SpO2 100%.  LABORATORY DATA: Lab Results  Component Value Date   WBC 6.1 08/04/2024   HGB 10.7 (L) 08/04/2024   HCT 34.8 (L) 08/04/2024   MCV 69.3 (L) 08/04/2024   PLT 349 08/04/2024      Chemistry      Component Value Date/Time   NA 139 11/03/2023 0845   K 3.6 11/03/2023 0845   CL 109 11/03/2023 0845   CO2 26 11/03/2023 0845   BUN 10 11/03/2023 0845   CREATININE 0.67 11/03/2023 0845      Component Value Date/Time   CALCIUM  9.8 11/03/2023 0845   ALKPHOS 59 11/03/2023 0845   AST 10 (L) 11/03/2023 0845   ALT 10 11/03/2023 0845   BILITOT 0.5 11/03/2023 0845       RADIOGRAPHIC STUDIES: No results found.  ASSESSMENT AND PLAN: This is a very pleasant 38 years old African-American female with history of iron  deficiency anemia secondary to previous menorrhagia but she has hysterectomy in October 2024. She was treated with iron  infusion with Venofer  300 Mg IV weekly for 3 weeks and feeling a little bit better. Assessment and Plan Assessment & Plan Iron  deficiency anemia  Persistent iron  deficiency anemia secondary to menorrhagia, status post hysterectomy in October 2024. Hemoglobin remains low at 10.7 g/dL with microcytic red blood cells (MCV 69.3 fL). Intolerance to oral iron  tablets, currently taking over-the-counter ferrous sulfate  intermittently. Dietary intake includes high iron  and protein foods. Occasional fatigue and dizziness reported. Sharp pain on the right side of the face and neck may be related to nerve issues rather than anemia. - Continue over-the-counter ferrous sulfate  every other day with orange juice to enhance absorption. - Monitor iron  studies; consider iron  infusion if levels are very low. - Encourage dietary intake of high iron  foods, including red meat. - Consider referral to a neurologist if sharp pain persists. - Schedule follow-up in six months. The patient was advised to  call immediately if he has any concerning symptoms in the interval. The patient voices understanding of current disease status and treatment options and is in agreement with the current care plan.  All questions were answered. The patient knows to call the clinic with any problems, questions or concerns. We can certainly see the patient much sooner if necessary.  The total time spent in the appointment was 20 minutes.  Disclaimer: This note was dictated with voice recognition software. Similar sounding words can inadvertently be transcribed and may not be corrected upon review.

## 2024-08-04 NOTE — Telephone Encounter (Signed)
 Dr. Sherrod, patient will be scheduled as soon as possible.  Auth Submission: NO AUTH NEEDED Site of care: Site of care: [facility] Payer: [employer] medicaid Medication & CPT/J Code(s) submitted: Venofer  (Iron  Sucrose) J1756 Diagnosis Code:  Route of submission (phone, fax, portal):  Phone # Fax # Auth type: Buy/Bill PB Units/visits requested: 300mg  x 3 doses Reference number:  Approval from: 08/04/24 to 12/04/24

## 2024-08-05 ENCOUNTER — Telehealth: Payer: Self-pay | Admitting: Internal Medicine

## 2024-08-05 NOTE — Telephone Encounter (Signed)
 Scheduled appointments per 8/18 los. Talked to the patient and she is aware of the made appointments.

## 2024-08-06 ENCOUNTER — Ambulatory Visit

## 2024-08-06 VITALS — BP 103/68 | HR 55 | Temp 97.6°F | Resp 16 | Ht 67.0 in | Wt 195.6 lb

## 2024-08-06 DIAGNOSIS — N920 Excessive and frequent menstruation with regular cycle: Secondary | ICD-10-CM

## 2024-08-06 DIAGNOSIS — D500 Iron deficiency anemia secondary to blood loss (chronic): Secondary | ICD-10-CM

## 2024-08-06 MED ORDER — SODIUM CHLORIDE 0.9 % IV SOLN
300.0000 mg | INTRAVENOUS | Status: DC
  Administered 2024-08-06: 300 mg via INTRAVENOUS
  Filled 2024-08-06: qty 15

## 2024-08-06 NOTE — Progress Notes (Signed)
 Diagnosis: Iron  Deficiency Anemia  Provider:  Praveen Mannam MD  Procedure: IV Infusion  IV Type: Peripheral, IV Location: L Antecubital  Venofer  (Iron  Sucrose), Dose: 300 mg  Infusion Start Time: 1358  Infusion Stop Time: 1535  Post Infusion IV Care: Patient declined observation and Peripheral IV Discontinued  Discharge: Condition: Good, Destination: Home . AVS Declined  Performed by:  Maximiano JONELLE Pouch, LPN

## 2024-08-13 ENCOUNTER — Ambulatory Visit (INDEPENDENT_AMBULATORY_CARE_PROVIDER_SITE_OTHER)

## 2024-08-13 VITALS — BP 95/71 | HR 67 | Temp 98.1°F | Resp 16 | Ht 68.0 in | Wt 194.6 lb

## 2024-08-13 DIAGNOSIS — N920 Excessive and frequent menstruation with regular cycle: Secondary | ICD-10-CM | POA: Diagnosis not present

## 2024-08-13 DIAGNOSIS — D500 Iron deficiency anemia secondary to blood loss (chronic): Secondary | ICD-10-CM | POA: Diagnosis not present

## 2024-08-13 MED ORDER — SODIUM CHLORIDE 0.9 % IV SOLN
300.0000 mg | INTRAVENOUS | Status: DC
  Administered 2024-08-13: 300 mg via INTRAVENOUS
  Filled 2024-08-13: qty 15

## 2024-08-13 NOTE — Progress Notes (Signed)
 Diagnosis: Iron  Deficiency Anemia  Provider:  Praveen Mannam MD  Procedure: IV Infusion  IV Type: Peripheral, IV Location: L Antecubital  Venofer  (Iron  Sucrose), Dose: 300 mg  Infusion Start Time: 1347  Infusion Stop Time: 1517  Post Infusion IV Care: Observation period completed and Peripheral IV Discontinued  Discharge: Condition: Good, Destination: Home . AVS Declined  Performed by:  Leita FORBES Miles, LPN

## 2024-08-20 ENCOUNTER — Ambulatory Visit (INDEPENDENT_AMBULATORY_CARE_PROVIDER_SITE_OTHER)

## 2024-08-20 VITALS — BP 99/66 | HR 64 | Temp 98.6°F | Resp 16 | Ht 67.0 in | Wt 196.0 lb

## 2024-08-20 DIAGNOSIS — D500 Iron deficiency anemia secondary to blood loss (chronic): Secondary | ICD-10-CM

## 2024-08-20 MED ORDER — SODIUM CHLORIDE 0.9 % IV SOLN
300.0000 mg | INTRAVENOUS | Status: DC
  Administered 2024-08-20: 300 mg via INTRAVENOUS
  Filled 2024-08-20: qty 15

## 2024-08-20 NOTE — Progress Notes (Signed)
 Diagnosis: Iron  Deficiency Anemia  Provider:  Praveen Mannam MD  Procedure: IV Infusion  IV Type: Peripheral, IV Location: L Antecubital  Venofer  (Iron  Sucrose), Dose: 300 mg  Infusion Start Time: 1407  Infusion Stop Time: 1554  Post Infusion IV Care: Patient declined observation and Peripheral IV Discontinued  Discharge: Condition: Good, Destination: Home . AVS Declined  Performed by:  Leita FORBES Miles, LPN

## 2024-12-02 ENCOUNTER — Emergency Department (HOSPITAL_COMMUNITY)
Admission: EM | Admit: 2024-12-02 | Discharge: 2024-12-02 | Disposition: A | Discharge status: Home / Self Care | Attending: Emergency Medicine | Admitting: Emergency Medicine

## 2024-12-02 ENCOUNTER — Encounter (HOSPITAL_COMMUNITY): Payer: Self-pay | Admitting: Emergency Medicine

## 2024-12-02 ENCOUNTER — Emergency Department (HOSPITAL_COMMUNITY)

## 2024-12-02 ENCOUNTER — Other Ambulatory Visit: Payer: Self-pay

## 2024-12-02 DIAGNOSIS — K439 Ventral hernia without obstruction or gangrene: Secondary | ICD-10-CM

## 2024-12-02 DIAGNOSIS — R1033 Periumbilical pain: Secondary | ICD-10-CM | POA: Diagnosis present

## 2024-12-02 DIAGNOSIS — Z79899 Other long term (current) drug therapy: Secondary | ICD-10-CM | POA: Diagnosis not present

## 2024-12-02 LAB — COMPREHENSIVE METABOLIC PANEL WITH GFR
ALT: 17 U/L (ref 0–44)
AST: 21 U/L (ref 15–41)
Albumin: 3.9 g/dL (ref 3.5–5.0)
Alkaline Phosphatase: 61 U/L (ref 38–126)
Anion gap: 9 (ref 5–15)
BUN: 8 mg/dL (ref 6–20)
CO2: 21 mmol/L — ABNORMAL LOW (ref 22–32)
Calcium: 9.5 mg/dL (ref 8.9–10.3)
Chloride: 107 mmol/L (ref 98–111)
Creatinine, Ser: 0.56 mg/dL (ref 0.44–1.00)
GFR, Estimated: >60 mL/min (ref >60)
Glucose, Bld: 99 mg/dL (ref 70–99)
Potassium: 4.1 mmol/L (ref 3.5–5.1)
Sodium: 137 mmol/L (ref 135–145)
Total Bilirubin: <0.2 mg/dL (ref 0.0–1.2)
Total Protein: 6.6 g/dL (ref 6.5–8.1)

## 2024-12-02 LAB — URINALYSIS, ROUTINE W REFLEX MICROSCOPIC
Bilirubin Urine: NEGATIVE
Glucose, UA: NEGATIVE mg/dL
Hgb urine dipstick: NEGATIVE
Ketones, ur: NEGATIVE mg/dL
Leukocytes,Ua: NEGATIVE
Nitrite: NEGATIVE
Protein, ur: NEGATIVE mg/dL
Specific Gravity, Urine: >1.046 — ABNORMAL HIGH (ref 1.005–1.030)
pH: 5 (ref 5.0–8.0)

## 2024-12-02 LAB — LIPASE, BLOOD: Lipase: 13 U/L (ref 11–51)

## 2024-12-02 LAB — CBC
HCT: 37.1 % (ref 36.0–46.0)
Hemoglobin: 11.4 g/dL — ABNORMAL LOW (ref 12.0–15.0)
MCH: 24.1 pg — ABNORMAL LOW (ref 26.0–34.0)
MCHC: 30.7 g/dL (ref 30.0–36.0)
MCV: 78.4 fL — ABNORMAL LOW (ref 80.0–100.0)
Platelets: 261 K/uL (ref 150–400)
RBC: 4.73 MIL/uL (ref 3.87–5.11)
RDW: 18.1 % — ABNORMAL HIGH (ref 11.5–15.5)
WBC: 6.4 K/uL (ref 4.0–10.5)
nRBC: 0 % (ref 0.0–0.2)

## 2024-12-02 MED ORDER — MORPHINE SULFATE (PF) 4 MG/ML IV SOLN
4.0000 mg | Freq: Once | INTRAVENOUS | Status: AC
  Administered 2024-12-02: 12:00:00 4 mg via INTRAVENOUS
  Filled 2024-12-02: qty 1

## 2024-12-02 MED ORDER — KETOROLAC TROMETHAMINE 15 MG/ML IJ SOLN
15.0000 mg | Freq: Once | INTRAMUSCULAR | Status: AC
  Administered 2024-12-02: 12:00:00 15 mg via INTRAVENOUS
  Filled 2024-12-02: qty 1

## 2024-12-02 MED ORDER — IOHEXOL 300 MG/ML  SOLN
100.0000 mL | Freq: Once | INTRAMUSCULAR | Status: AC | PRN
  Administered 2024-12-02: 10:00:00 100 mL via INTRAVENOUS

## 2024-12-02 MED ORDER — LACTATED RINGERS IV BOLUS
1000.0000 mL | Freq: Once | INTRAVENOUS | Status: AC
  Administered 2024-12-02: 09:00:00 1000 mL via INTRAVENOUS

## 2024-12-02 MED ORDER — MORPHINE SULFATE (PF) 4 MG/ML IV SOLN
4.0000 mg | Freq: Once | INTRAVENOUS | Status: AC
  Administered 2024-12-02: 09:00:00 4 mg via INTRAVENOUS
  Filled 2024-12-02: qty 1

## 2024-12-02 MED ORDER — ONDANSETRON HCL 4 MG/2ML IJ SOLN
4.0000 mg | Freq: Once | INTRAMUSCULAR | Status: AC
  Administered 2024-12-02: 09:00:00 4 mg via INTRAVENOUS
  Filled 2024-12-02: qty 2

## 2024-12-02 NOTE — ED Triage Notes (Signed)
 Pt with RUQ pain for 2 days. Reports having a mass there for a few years since hysterectomy and reports she was told by her doctor this was just fatty tissue  Pt reports this mass has grown in size and pain is 10/10 radiating to back.  No n/v/d or shob.  No urinary symptoms. Reports hx of anemia.

## 2024-12-02 NOTE — Discharge Instructions (Addendum)
 Overall all the test look good.  The CAT scan did show that there is some fat being pinched in the hernia above your bellybutton which is most likely causing all the pain you are experiencing.  They did see a small spot in your liver which is most likely just a blood vessel but did state in the future your doctor needs to order an MRI to look at that more closely.  In the meantime you need to avoid any heavy lifting or excessive bending.  Prescription was sent to your pharmacy that you can take as needed for pain.  In addition to the pain medicine you can also use ibuprofen  2 tablets every 6 hours.  If you start having fever, excessive vomiting return to the emergency room

## 2024-12-02 NOTE — ED Provider Notes (Signed)
 [place] EMERGENCY DEPARTMENT AT [facility] Provider Note   CSN: [id number] Arrival date & time: 12/02/24  9261     Patient presents with: Abdominal Pain   [name] [name] is a 38 y.o. female.   Patient is a 38 year old female with a history of anemia, depression, status post hysterectomy and recurrent perianal abscesses who is presenting today with complaints of abdominal pain.  This abdominal pain started approximately 2 days ago and she describes it as periumbilical in nature.  The pain has only worsened over the last 2 days and now is excruciating with any movement.  She also feels that it goes straight into her back and at times it is hard to find a comfortable position.  She has noticed that eating has made it worse and she has decreased her oral intake.  She has had multiple bowel movements which have not improved the pain and denies any urinary symptoms.  She has not had fever or vomiting but did have some nausea.  She normally does not suffer with abdominal pain and does not have any sick contacts.  She has had a prior hysterectomy but no other abdominal surgeries.  She denies any cough congestion respiratory symptoms.  She tried drinking some alcohol last night which did not help with the pain and had taken a Tylenol  which also did not help with her pain.  The history is provided by the patient.  Abdominal Pain      Prior to Admission medications  Medication Sig Start Date End Date Taking? Authorizing Provider  ferrous sulfate  325 (65 FE) MG tablet Take 1 tablet (325 mg total) by mouth daily. 09/04/23   Davis, Jonathon H, MD  ibuprofen  (ADVIL ) 800 MG tablet Take 1 tablet (800 mg total) by mouth every 8 (eight) hours as needed. 09/19/23   Glennon Almarie POUR, MD  Simethicone  (GAS-X EXTRA STRENGTH) 125 MG CAPS Take 1 capsule (125 mg total) by mouth 2 (two) times daily as needed (gas pain after surgery). 09/25/23   Glennon Almarie POUR, MD  albuterol  (VENTOLIN  HFA) 108  [phone] Base) MCG/ACT inhaler Inhale 1-2 puffs into the lungs every 6 (six) hours as needed for wheezing or shortness of breath. Patient taking differently: Inhale 1-2 puffs into the lungs every 6 (six) hours as needed for wheezing or shortness of breath. Out of inhaler 07/03/19 12/02/20  Midge Golas, MD    Allergies: Amoxicillin  and Penicillins    Review of Systems  Gastrointestinal:  Positive for abdominal pain.    Updated Vital Signs BP 105/71 (BP Location: Right Arm)   Pulse (!) 50   Temp (!) 97.5 F (36.4 C) (Oral)   Resp 18   LMP 09/19/2023   SpO2 99%   Physical Exam Vitals and nursing note reviewed.  Constitutional:      General: She is not in acute distress.    Appearance: She is well-developed.  HENT:     Head: Normocephalic and atraumatic.  Eyes:     Pupils: Pupils are equal, round, and reactive to light.  Cardiovascular:     Rate and Rhythm: Normal rate and regular rhythm.     Pulses: Normal pulses.     Heart sounds: Normal heart sounds. No murmur heard.    No friction rub.  Pulmonary:     Effort: Pulmonary effort is normal.     Breath sounds: Normal breath sounds. No wheezing or rales.  Abdominal:     General: Bowel sounds are normal. There is no  distension.     Palpations: Abdomen is soft.     Tenderness: There is abdominal tenderness in the periumbilical area. There is guarding. There is no right CVA tenderness, left CVA tenderness or rebound.  Musculoskeletal:        General: No tenderness. Normal range of motion.     Right lower leg: No edema.     Left lower leg: No edema.     Comments: No edema  Skin:    General: Skin is warm and dry.     Findings: No rash.  Neurological:     Mental Status: She is alert and oriented to person, place, and time.     Cranial Nerves: No cranial nerve deficit.  Psychiatric:        Behavior: Behavior normal.     (all labs ordered are listed, but only abnormal results are displayed) Labs Reviewed  COMPREHENSIVE  METABOLIC PANEL WITH GFR - Abnormal; Notable for the following components:      Result Value   CO2 21 (*)    All other components within normal limits  CBC - Abnormal; Notable for the following components:   Hemoglobin 11.4 (*)    MCV 78.4 (*)    MCH 24.1 (*)    RDW 18.1 (*)    All other components within normal limits  URINALYSIS, ROUTINE W REFLEX MICROSCOPIC - Abnormal; Notable for the following components:   Specific Gravity, Urine >1.046 (*)    All other components within normal limits  LIPASE, BLOOD    EKG: None  Radiology: CT ABDOMEN PELVIS W CONTRAST Result Date: 12/02/2024 EXAM: CT ABDOMEN AND PELVIS WITH CONTRAST 12/02/2024 10:24:26 AM TECHNIQUE: CT of the abdomen and pelvis was performed with the administration of intravenous contrast. Multiplanar reformatted images are provided for review. Automated exposure control, iterative reconstruction, and/or weight-based adjustment of the mA/kV was utilized to reduce the radiation dose to as low as reasonably achievable. COMPARISON: None available. CLINICAL HISTORY: Abdominal pain, acute, nonlocalized; periumbilical abd pain. FINDINGS: LOWER CHEST: No acute abnormality. LIVER: A small rounded focus of material enhancement is seen within the left hepatic lobe (series 2, image 23) which is indeterminate on this single phase examination. This may represent an area of arteriovascular shunting or a small flash fluid hemangioma, but is not well characterized. A subtly enhancing 2.3 cm rounded mass is seen within the right hepatic dome (series 2, image 11) which is also indeterminate. Dedicated contrast enhanced MRI examination is recommended for further characterization. At least mild hepatic steatosis and moderate hepatomegaly noted. GALLBLADDER AND BILE DUCTS: Gallbladder is unremarkable. No biliary ductal dilatation. SPLEEN: No acute abnormality. PANCREAS: No acute abnormality. ADRENAL GLANDS: No acute abnormality. KIDNEYS, URETERS AND BLADDER:  No stones in the kidneys or ureters. No hydronephrosis. No perinephric or periureteral stranding. Urinary bladder is unremarkable. GI AND BOWEL: There is relative hyperemia involving the cecum and ascending colon, a finding that may reflect mild infectious or inflammatory colitis. There is no evidence of obstruction or perforation. Scattered diverticula are seen throughout the colon without superimposed focal inflammatory change. The stomach, small bowel, and large bowel are otherwise unremarkable. Appendix normal. PERITONEUM AND RETROPERITONEUM: Small free fluid within the pelvis is nonspecific. No free air. VASCULATURE: Aorta is normal in caliber. LYMPH NODES: Shotty mesenteric adenopathy is present within the ileocolic lymph node, which may be reactive in nature. No frankly pathologic adenopathy within the abdomen and pelvis. REPRODUCTIVE ORGANS: Status post hysterectomy. Corpus luteum noted within the residual right ovary. Residual left  ovary is unremarkable. BONES AND SOFT TISSUES: No acute osseous abnormality. Tiny fat-containing umbilical and small bilateral fat-containing inguinal hernias are present. Small fat-containing epigastric ventral hernia is present with superimposed subtle inflammatory changes surrounding the hernia sac, best seen on series 2, image 42, which may represent incarceration of the herniated fat. The hernia defect measures 6-7 mm in size with a hernia sac itself measuring up to 2.5 cm. IMPRESSION: 1. Small fat-containing epigastric ventral hernia with subtle inflammatory changes, possibly indicating incarceration of herniated fat. Correlation with clinical examination is recommended. 2. Relative hyperemia of the cecum and ascending colon, suggestive of mild infectious or inflammatory colitis, without obstruction or perforation. Associated ileocolic reactive adenopathy . 3. Indeterminate 2.3 cm subtly enhancing rounded mass in the right hepatic dome and a small rounded focus of arterial  enhancement in the left hepatic lobe; recommend dedicated contrast-enhanced liver MRI for further characterization. 4. Mild hepatic steatosis and moderate hepatomegaly. 5. Small free pelvic fluid, nonspecific. 6. Tiny fat-containing umbilical and small bilateral fat-containing inguinal hernias. Electronically signed by: Dorethia Molt MD 12/02/2024 10:52 AM EST RP Workstation: [id number]     Procedures   Medications Ordered in the ED  lactated ringers  bolus 1,000 mL (1,000 mLs Intravenous New Bag/Given 12/02/24 0842)  ondansetron  (ZOFRAN ) injection 4 mg (4 mg Intravenous Given 12/02/24 0841)  morphine  (PF) 4 MG/ML injection 4 mg (4 mg Intravenous Given 12/02/24 0842)  iohexol  (OMNIPAQUE ) 300 MG/ML solution 100 mL (100 mLs Intravenous Contrast Given 12/02/24 1005)  ketorolac  (TORADOL ) 15 MG/ML injection 15 mg (15 mg Intravenous Given 12/02/24 1132)  morphine  (PF) 4 MG/ML injection 4 mg (4 mg Intravenous Given 12/02/24 1132)                                    Medical Decision Making Amount and/or Complexity of Data Reviewed External Data Reviewed: notes. Labs: ordered. Decision-making details documented in ED Course. Radiology: ordered and independent interpretation performed. Decision-making details documented in ED Course.  Risk Prescription drug management.   Pt with multiple medical problems and comorbidities and presenting today with a complaint that caries a high risk for morbidity and mortality.  Here today with complaint of abdominal pain.  Concern for incarcerated hernia, cholelithiasis, pancreatitis, gastritis, enteritis.  Lower suspicion for bowel obstruction.  Patient did have an ultrasound years ago that did show cholelithiasis at the time.  Low suspicion for pneumonia, cardiac causes, GU cause.  She has no urinary complaints and has had a prior hysterectomy.  Pain is all above the umbilicus.  No evidence of zoster or localized abscess. Patient given pain control.  Labs and  imaging are pending.  12:16 PM I independently interpreted patient's labs and lipase, CMP, CBC all without acute findings.  UA within normal limits.  I have independently visualized and interpreted pt's images today.  CT of the abdomen and pelvis shows no evidence of renal stone or obstruction, radiology reports a small fat-containing epigastric ventral hernia with subtle inflammation possibly indicating incarceration of a herniated fat which is exactly where patient is having pain.  There is relative hyperemia of the cecum and ascending colon suggestive of infectious or inflammatory colitis without obstruction or perforation which is most likely nonspecific and does not seem to be the cause of her symptoms today.  She does have an indeterminate 2.3 cm subtle enhancement in the right dome of the liver that in the future will need an  MRI that she can follow-up with her doctor.  On repeat evaluation patient is still having some focal pain but otherwise is well-appearing.  Will discharge patient for supportive care and return precautions.      Final diagnoses:  Ventral hernia without obstruction or gangrene  Periumbilical abdominal pain    ED Discharge Orders     None          Doretha Folks, MD 12/02/24 1216

## 2024-12-05 ENCOUNTER — Encounter (HOSPITAL_BASED_OUTPATIENT_CLINIC_OR_DEPARTMENT_OTHER): Payer: Self-pay

## 2024-12-05 DIAGNOSIS — K439 Ventral hernia without obstruction or gangrene: Secondary | ICD-10-CM | POA: Insufficient documentation

## 2024-12-05 DIAGNOSIS — R109 Unspecified abdominal pain: Secondary | ICD-10-CM | POA: Diagnosis present

## 2024-12-05 LAB — LIPASE, BLOOD: Lipase: 17 U/L (ref 11–51)

## 2024-12-05 LAB — CBC
HCT: 38.5 % (ref 36.0–46.0)
Hemoglobin: 12.2 g/dL (ref 12.0–15.0)
MCH: 24.1 pg — ABNORMAL LOW (ref 26.0–34.0)
MCHC: 31.7 g/dL (ref 30.0–36.0)
MCV: 75.9 fL — ABNORMAL LOW (ref 80.0–100.0)
Platelets: 380 K/uL (ref 150–400)
RBC: 5.07 MIL/uL (ref 3.87–5.11)
RDW: 17.9 % — ABNORMAL HIGH (ref 11.5–15.5)
WBC: 9 K/uL (ref 4.0–10.5)
nRBC: 0 % (ref 0.0–0.2)

## 2024-12-05 LAB — COMPREHENSIVE METABOLIC PANEL WITH GFR
ALT: 26 U/L (ref 0–44)
AST: 20 U/L (ref 15–41)
Albumin: 4.2 g/dL (ref 3.5–5.0)
Alkaline Phosphatase: 75 U/L (ref 38–126)
Anion gap: 11 (ref 5–15)
BUN: 10 mg/dL (ref 6–20)
CO2: 21 mmol/L — ABNORMAL LOW (ref 22–32)
Calcium: 10.2 mg/dL (ref 8.9–10.3)
Chloride: 107 mmol/L (ref 98–111)
Creatinine, Ser: 0.63 mg/dL (ref 0.44–1.00)
GFR, Estimated: >60 mL/min
Glucose, Bld: 120 mg/dL — ABNORMAL HIGH (ref 70–99)
Potassium: 4 mmol/L (ref 3.5–5.1)
Sodium: 139 mmol/L (ref 135–145)
Total Bilirubin: 0.2 mg/dL (ref 0.0–1.2)
Total Protein: 7.2 g/dL (ref 6.5–8.1)

## 2024-12-05 MED ORDER — OXYCODONE-ACETAMINOPHEN 5-325 MG PO TABS
1.0000 | ORAL_TABLET | ORAL | Status: DC | PRN
  Administered 2024-12-05: 1 via ORAL
  Filled 2024-12-05: qty 1

## 2024-12-05 NOTE — ED Triage Notes (Signed)
 Patient reports having a twisted hernia and says she needs it out. Says she has been to [facility] and was treated with morphine , and said it never took the pain away. She had CT scans and was sent home. Since then she has not been able to get comfortable or do anything.

## 2024-12-06 ENCOUNTER — Emergency Department (HOSPITAL_BASED_OUTPATIENT_CLINIC_OR_DEPARTMENT_OTHER)
Admission: EM | Admit: 2024-12-06 | Discharge: 2024-12-06 | Disposition: A | Attending: Emergency Medicine | Admitting: Emergency Medicine

## 2024-12-06 DIAGNOSIS — K439 Ventral hernia without obstruction or gangrene: Secondary | ICD-10-CM

## 2024-12-06 LAB — URINALYSIS, ROUTINE W REFLEX MICROSCOPIC
Bilirubin Urine: NEGATIVE
Glucose, UA: NEGATIVE mg/dL
Hgb urine dipstick: NEGATIVE
Ketones, ur: NEGATIVE mg/dL
Leukocytes,Ua: NEGATIVE
Nitrite: NEGATIVE
Protein, ur: NEGATIVE mg/dL
Specific Gravity, Urine: 1.029 (ref 1.005–1.030)
pH: 6 (ref 5.0–8.0)

## 2024-12-06 MED ORDER — MELOXICAM 7.5 MG PO TABS: 7.5000 mg | ORAL_TABLET | Freq: Every day | ORAL | 0 refills | Status: AC

## 2024-12-06 MED ORDER — KETOROLAC TROMETHAMINE 15 MG/ML IJ SOLN
15.0000 mg | Freq: Once | INTRAMUSCULAR | Status: AC
  Administered 2024-12-06: 15 mg via INTRAVENOUS
  Filled 2024-12-06: qty 1

## 2024-12-06 NOTE — ED Provider Notes (Signed)
 " [place] EMERGENCY DEPARTMENT AT [facility] Provider Note   CSN: [id number] Arrival date & time: 12/05/24  2146     Patient presents with: Abdominal Pain   [name] [name] is a 38 y.o. female.   This is a 38 year old female who is here today due to abdominal pain.  She was recently seen on 12/16 and diagnosed with a fat-containing ventral hernia.  She was discharged with analgesia which she says did not arrive at her pharmacy.  She is here today because she continues to have pain.  She has not tried any over-the-counter medicines at home.   Abdominal Pain      Prior to Admission medications  Medication Sig Start Date End Date Taking? Authorizing Provider  meloxicam  (MOBIC ) 7.5 MG tablet Take 1 tablet (7.5 mg total) by mouth daily for 14 days. 12/06/24 12/20/24 Yes Mannie Fairy DASEN, DO  ferrous sulfate  325 (65 FE) MG tablet Take 1 tablet (325 mg total) by mouth daily. 09/04/23   Davis, Jonathon H, MD  ibuprofen  (ADVIL ) 800 MG tablet Take 1 tablet (800 mg total) by mouth every 8 (eight) hours as needed. 09/19/23   Glennon Almarie POUR, MD  Simethicone  (GAS-X EXTRA STRENGTH) 125 MG CAPS Take 1 capsule (125 mg total) by mouth 2 (two) times daily as needed (gas pain after surgery). 09/25/23   Glennon Almarie POUR, MD  albuterol  (VENTOLIN  HFA) 108 [phone] Base) MCG/ACT inhaler Inhale 1-2 puffs into the lungs every 6 (six) hours as needed for wheezing or shortness of breath. Patient taking differently: Inhale 1-2 puffs into the lungs every 6 (six) hours as needed for wheezing or shortness of breath. Out of inhaler 07/03/19 12/02/20  Midge Golas, MD    Allergies: Amoxicillin  and Penicillins    Review of Systems  Gastrointestinal:  Positive for abdominal pain.    Updated Vital Signs BP 106/74   Pulse 89   Temp 97.9 F (36.6 C)   Resp 18   LMP 09/19/2023   SpO2 97%   Physical Exam Vitals and nursing note reviewed.  Abdominal:     General: Abdomen is flat.      Palpations: Abdomen is soft.     Tenderness: There is abdominal tenderness.     Hernia: A hernia is present. Hernia is present in the ventral area.  Neurological:     Mental Status: She is alert.     (all labs ordered are listed, but only abnormal results are displayed) Labs Reviewed  COMPREHENSIVE METABOLIC PANEL WITH GFR - Abnormal; Notable for the following components:      Result Value   CO2 21 (*)    Glucose, Bld 120 (*)    All other components within normal limits  CBC - Abnormal; Notable for the following components:   MCV 75.9 (*)    MCH 24.1 (*)    RDW 17.9 (*)    All other components within normal limits  LIPASE, BLOOD  URINALYSIS, ROUTINE W REFLEX MICROSCOPIC    EKG: None  Radiology: No results found.   Procedures   Medications Ordered in the ED  oxyCODONE -acetaminophen  (PERCOCET /ROXICET) 5-325 MG per tablet 1 tablet (1 tablet Oral Given 12/05/24 2355)  ketorolac  (TORADOL ) 15 MG/ML injection 15 mg (has no administration in time range)                                    Medical Decision Making 38 year old female  here today with abdominal pain.  Differential diagnosis includes incarcerated ventral fat-containing hernia, less likely bowel obstruction, less likely incarcerated bowel hernia.  Plan-on exam, patient is well-appearing.  She has normal vital signs.  Her abdomen is soft, she is tender over the ventral area.  Looking at her imaging previously, she likely is experiencing some fat necrosis secondary to this incarcerated hernia.  Patient has not been trying any medications at home.  Will provide her with some Toradol  here, sent a prescription for meloxicam  to her pharmacy.  Discussed this with the patient, she understands that this likely will not be treated surgically, but will refer to general surgery.  Amount and/or Complexity of Data Reviewed Labs: ordered.  Risk Prescription drug management.        Final diagnoses:  Ventral hernia without  obstruction or gangrene    ED Discharge Orders          Ordered    meloxicam  (MOBIC ) 7.5 MG tablet  Daily        12/06/24 0117               Mannie Pac T, DO 12/06/24 0118  "

## 2024-12-06 NOTE — Discharge Instructions (Addendum)
 Like we discussed, you have a fat-containing hernia.  This rarely is treated with surgery, and should improve with appropriate pain medication.  You may take meloxicam  once per day for the next 2 weeks.  I also recommend that you take 1000 mg of Tylenol  every 8 hours.  Including your discharge paperwork is a telephone number for a general surgery office.  I recommend that you call them so that they can discuss with you potential surgical options.  Return to the emergency room if you are unable to eat or drink, develop persistent vomiting.

## 2025-02-04 ENCOUNTER — Ambulatory Visit: Admitting: Internal Medicine

## 2025-02-04 ENCOUNTER — Other Ambulatory Visit
